# Patient Record
Sex: Male | Born: 1955
Health system: Southern US, Community
[De-identification: ages and names within clinical notes are randomized; demographics above are authoritative.]

## PROBLEM LIST (undated history)

## (undated) DIAGNOSIS — K432 Incisional hernia without obstruction or gangrene: Secondary | ICD-10-CM

## (undated) DIAGNOSIS — E559 Vitamin D deficiency, unspecified: Secondary | ICD-10-CM

## (undated) DIAGNOSIS — R413 Other amnesia: Secondary | ICD-10-CM

## (undated) DIAGNOSIS — M5416 Radiculopathy, lumbar region: Secondary | ICD-10-CM

## (undated) DIAGNOSIS — B1910 Unspecified viral hepatitis B without hepatic coma: Secondary | ICD-10-CM

## (undated) DIAGNOSIS — K7460 Unspecified cirrhosis of liver: Secondary | ICD-10-CM

## (undated) DIAGNOSIS — K219 Gastro-esophageal reflux disease without esophagitis: Secondary | ICD-10-CM

## (undated) DIAGNOSIS — G729 Myopathy, unspecified: Secondary | ICD-10-CM

## (undated) DIAGNOSIS — C9140 Hairy cell leukemia not having achieved remission: Secondary | ICD-10-CM

## (undated) DIAGNOSIS — M5412 Radiculopathy, cervical region: Secondary | ICD-10-CM

## (undated) HISTORY — DX: Hairy cell leukemia not having achieved remission: C91.40

## (undated) HISTORY — PX: SPLENECTOMY, TOTAL: SHX788

## (undated) HISTORY — DX: Radiculopathy, lumbar region: M54.16

## (undated) HISTORY — DX: Unspecified cirrhosis of liver: K74.60

## (undated) HISTORY — DX: Vitamin D deficiency, unspecified: E55.9

## (undated) HISTORY — DX: Myopathy, unspecified: G72.9

## (undated) HISTORY — PX: HERNIA REPAIR: SHX51

## (undated) HISTORY — DX: Other amnesia: R41.3

## (undated) HISTORY — DX: Unspecified viral hepatitis B without hepatic coma: B19.10

## (undated) HISTORY — DX: Incisional hernia without obstruction or gangrene: K43.2

## (undated) HISTORY — DX: Gastro-esophageal reflux disease without esophagitis: K21.9

## (undated) HISTORY — DX: Radiculopathy, cervical region: M54.12

---

## 2003-04-15 ENCOUNTER — Encounter: Admission: RE | Admit: 2003-04-15 | Discharge: 2003-04-15 | Payer: Self-pay | Admitting: Specialist

## 2003-06-08 DIAGNOSIS — C9140 Hairy cell leukemia not having achieved remission: Secondary | ICD-10-CM

## 2003-06-08 HISTORY — DX: Hairy cell leukemia not having achieved remission: C91.40

## 2003-07-29 ENCOUNTER — Inpatient Hospital Stay (HOSPITAL_COMMUNITY): Admission: EM | Admit: 2003-07-29 | Discharge: 2003-08-29 | Payer: Self-pay | Admitting: Emergency Medicine

## 2003-07-30 ENCOUNTER — Encounter (INDEPENDENT_AMBULATORY_CARE_PROVIDER_SITE_OTHER): Payer: Self-pay | Admitting: *Deleted

## 2003-08-03 ENCOUNTER — Encounter (INDEPENDENT_AMBULATORY_CARE_PROVIDER_SITE_OTHER): Payer: Self-pay | Admitting: Specialist

## 2003-09-04 ENCOUNTER — Encounter: Admission: RE | Admit: 2003-09-04 | Discharge: 2003-09-04 | Payer: Self-pay | Admitting: Oncology

## 2003-09-25 ENCOUNTER — Ambulatory Visit (HOSPITAL_COMMUNITY): Admission: RE | Admit: 2003-09-25 | Discharge: 2003-09-25 | Payer: Self-pay | Admitting: Oncology

## 2003-10-22 ENCOUNTER — Encounter: Admission: RE | Admit: 2003-10-22 | Discharge: 2003-10-22 | Payer: Self-pay | Admitting: Internal Medicine

## 2003-11-07 ENCOUNTER — Encounter (INDEPENDENT_AMBULATORY_CARE_PROVIDER_SITE_OTHER): Payer: Self-pay | Admitting: Specialist

## 2003-11-07 ENCOUNTER — Ambulatory Visit (HOSPITAL_COMMUNITY): Admission: RE | Admit: 2003-11-07 | Discharge: 2003-11-07 | Payer: Self-pay | Admitting: Oncology

## 2004-01-17 ENCOUNTER — Ambulatory Visit (HOSPITAL_COMMUNITY): Admission: RE | Admit: 2004-01-17 | Discharge: 2004-01-17 | Payer: Self-pay | Admitting: Oncology

## 2004-04-13 ENCOUNTER — Ambulatory Visit: Payer: Self-pay | Admitting: Oncology

## 2004-06-09 ENCOUNTER — Ambulatory Visit: Payer: Self-pay | Admitting: Oncology

## 2004-06-10 ENCOUNTER — Ambulatory Visit (HOSPITAL_COMMUNITY): Admission: RE | Admit: 2004-06-10 | Discharge: 2004-06-10 | Payer: Self-pay | Admitting: Oncology

## 2004-06-15 ENCOUNTER — Ambulatory Visit (HOSPITAL_COMMUNITY): Admission: RE | Admit: 2004-06-15 | Discharge: 2004-06-15 | Payer: Self-pay | Admitting: Oncology

## 2004-07-22 ENCOUNTER — Ambulatory Visit (HOSPITAL_COMMUNITY): Admission: RE | Admit: 2004-07-22 | Discharge: 2004-07-22 | Payer: Self-pay | Admitting: General Surgery

## 2004-07-29 ENCOUNTER — Ambulatory Visit: Payer: Self-pay | Admitting: Oncology

## 2004-09-17 ENCOUNTER — Ambulatory Visit: Payer: Self-pay | Admitting: Oncology

## 2004-10-15 ENCOUNTER — Ambulatory Visit: Payer: Self-pay | Admitting: Oncology

## 2004-12-17 ENCOUNTER — Ambulatory Visit: Payer: Self-pay | Admitting: Oncology

## 2004-12-23 ENCOUNTER — Other Ambulatory Visit: Admission: RE | Admit: 2004-12-23 | Discharge: 2004-12-23 | Payer: Self-pay | Admitting: Oncology

## 2004-12-23 ENCOUNTER — Ambulatory Visit: Payer: Self-pay | Admitting: Oncology

## 2005-02-10 ENCOUNTER — Ambulatory Visit: Payer: Self-pay | Admitting: Oncology

## 2005-03-06 IMAGING — CT CT PELVIS W/ CM
1 of 4 series · 14 of 32 positions shown, 19 images · IV contrast (omnipaque)
Comparison: none

CLINICAL DATA: Generalized abdominal pain.
 CT ABDOMEN WITH CONTRAST AND CT PELVIS WITH CONTRAST ? 07/29/03
 No prior studies for comparison.
 The patient received 150 cc of Omnipaque 300 IV contrast as well as oral contrast for the study.  
 CT ABDOMEN WITH CONTRAST:
 The visualized lung bases show mild bibasilar atelectasis.  There is significant confluent adenopathy in multiple regions of the abdomen beginning in the retrocrural space in the upper abdomen and continuing to diffusely involve enlarged periportal, celiac, peripancreatic, upper mesenteric, and retroperitoneal lymph nodes.  It is difficult to measure any one single node given the confluent adenopathy present.  The largest single dimension of confluent adenopathy in the periportal region approaches 6 cm in estimated diameter on image #28.  There is associated massive splenomegaly with the spleen continuing down nearly into the left pelvis.  The maximal AP dimensions of the spleen are approximately 18 cm.  Based on the scout image, the height of the spleen is likely much greater than this.  The conglomeration of findings suggests lymphoma.  Leukemia is also in the differential diagnosis.  
 The rest of the study shows a simple cyst of the lateral right kidney.  The gallbladder, pancreas, and adrenal glands have a normal appearance.  The liver shows no focal lesions or evidence of biliary dilatation.  The bowel loops are unremarkable.  
 IMPRESSION
 Massive confluent intra-abdominal adenopathy in multiple locations as above.  This is associated with massive splenomegaly.  Differential considerations include lymphoma and leukemia.
 CT PELVIS WITH CONTRAST:
 No significant retroperitoneal or inguinal adenopathy is present in the pelvis.  There may be a minimal amount of free fluid adjacent to some pelvic bowel loops.  No bowel obstruction.
 No significant adenopathy in the pelvis.

[Series 2: abd/pelvis 5.0 b30f · axial · 0.74mm/px · z∈[-458,-58]mm · 14 of 92 slices shown, 19 images]
[im 6/92  soft-tissue]
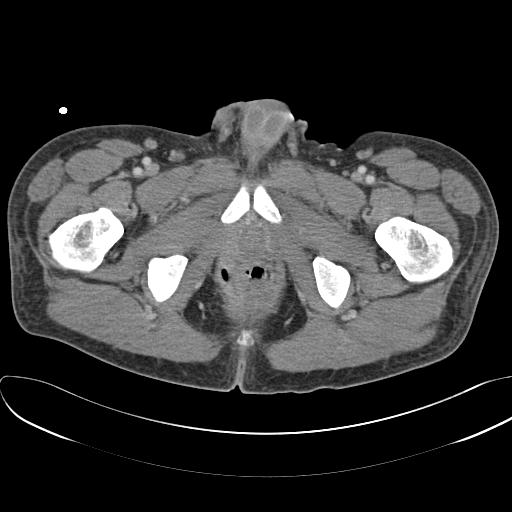
[im 6/92  bone]
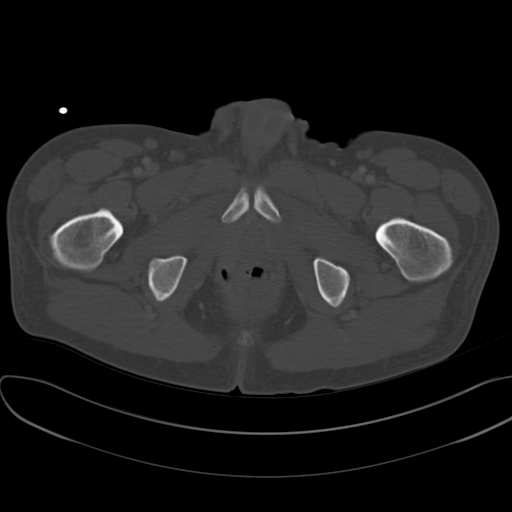
[im 12/92  soft-tissue]
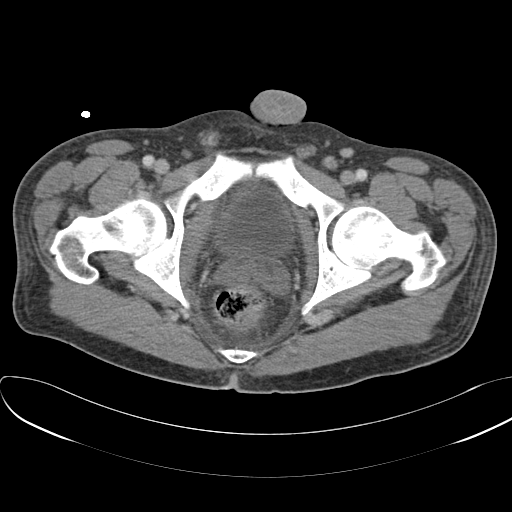
[im 18/92  soft-tissue]
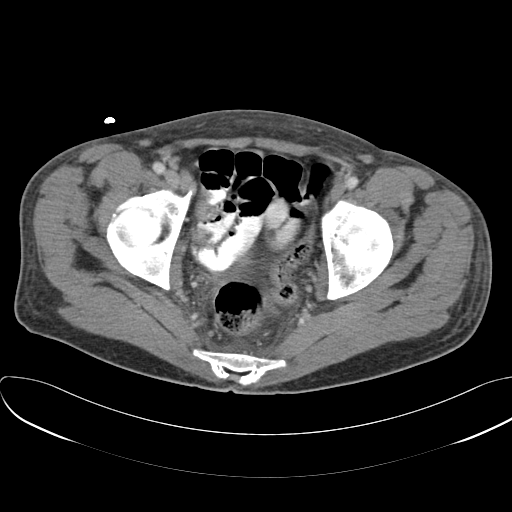
[im 29/92  soft-tissue]
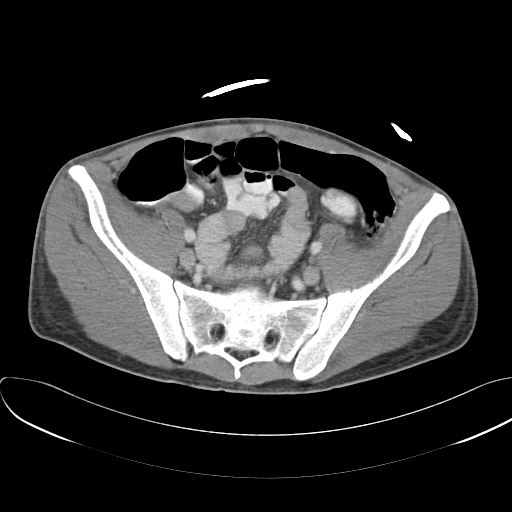
[im 35/92  soft-tissue]
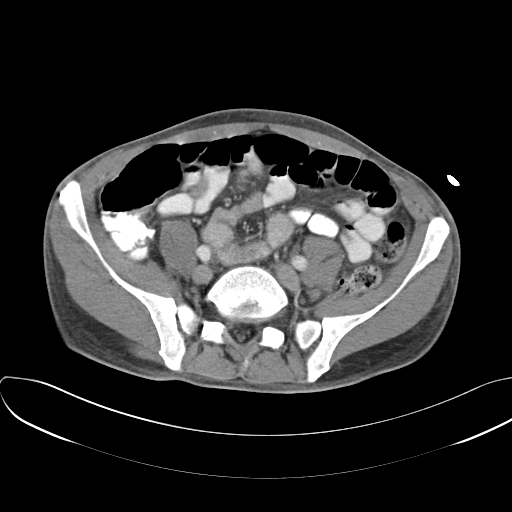
[im 40/92  soft-tissue]
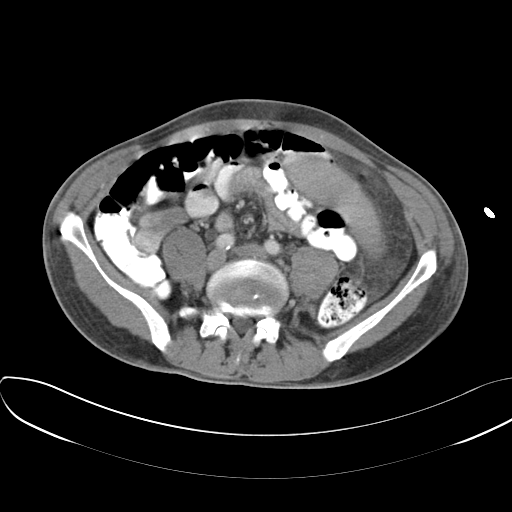
[im 46/92  soft-tissue]
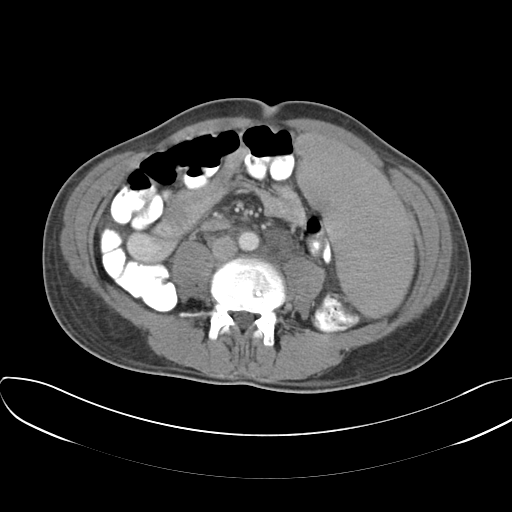
[im 52/92  soft-tissue]
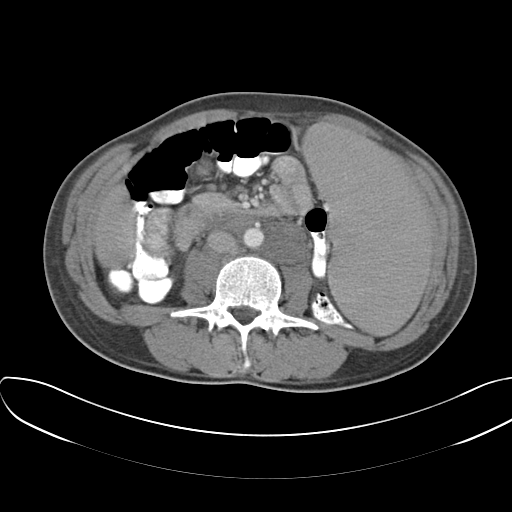
[im 57/92  soft-tissue]
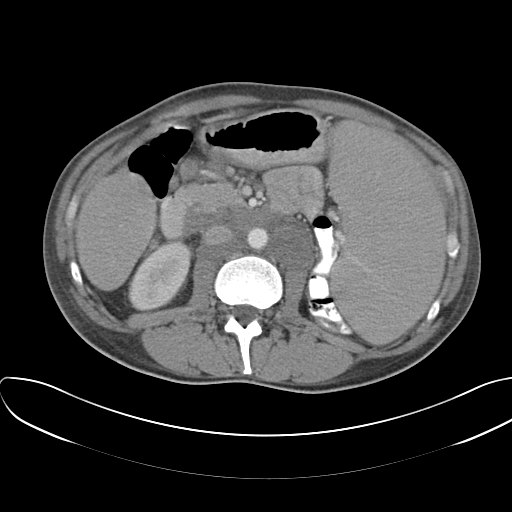
[im 57/92  bone]
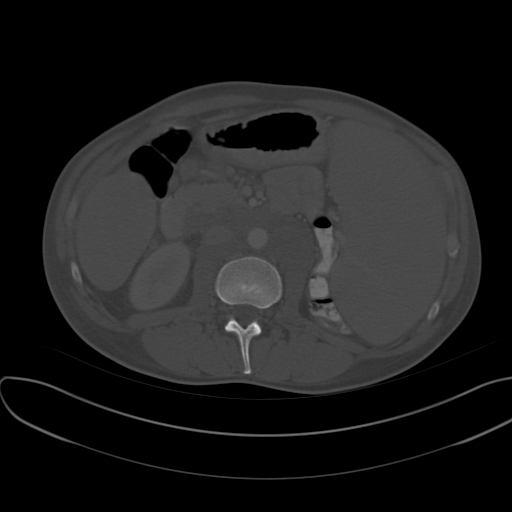
[im 63/92  soft-tissue]
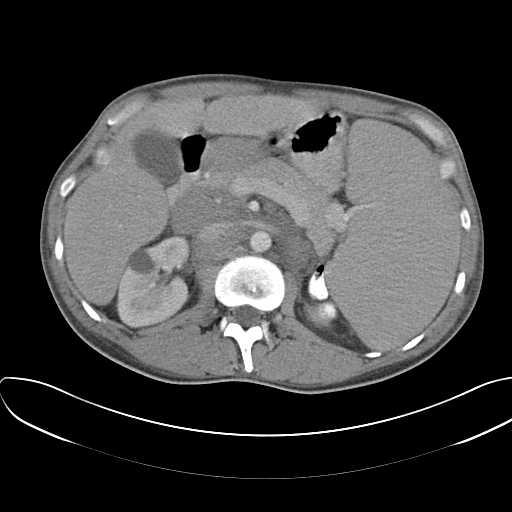
[im 69/92  lung]
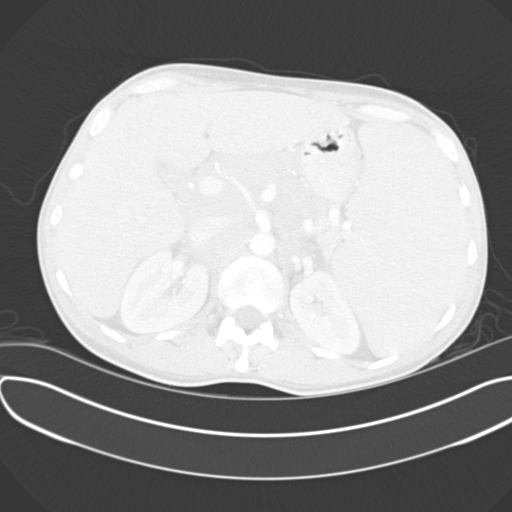
[im 74/92  soft-tissue]
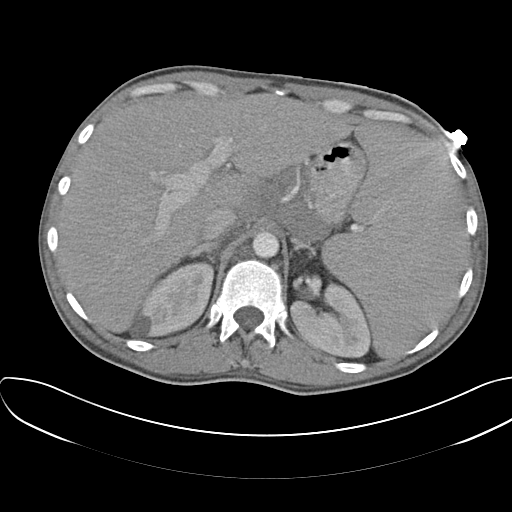
[im 74/92  lung]
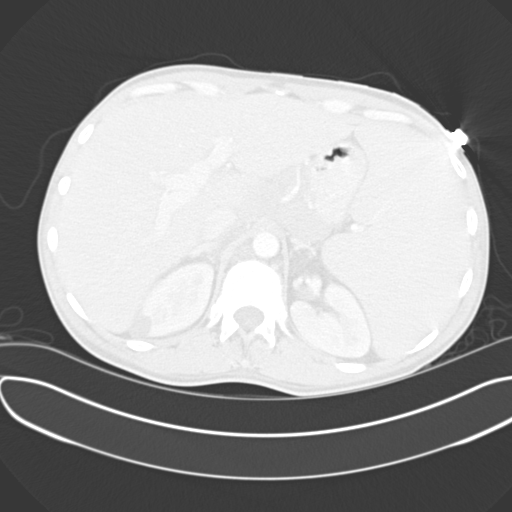
[im 80/92  soft-tissue]
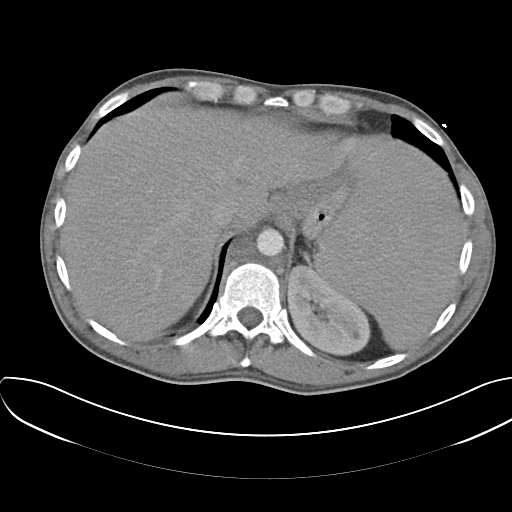
[im 80/92  lung]
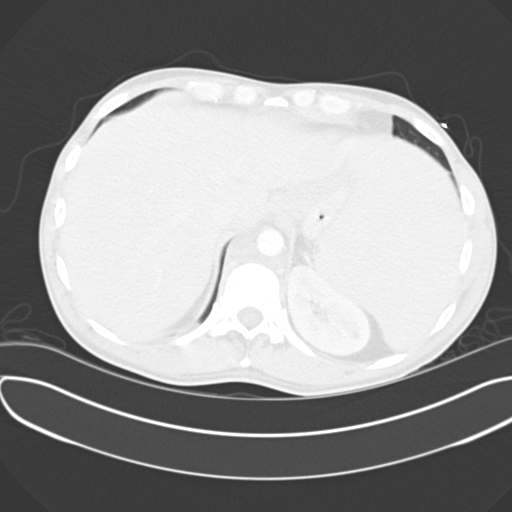
[im 86/92  soft-tissue]
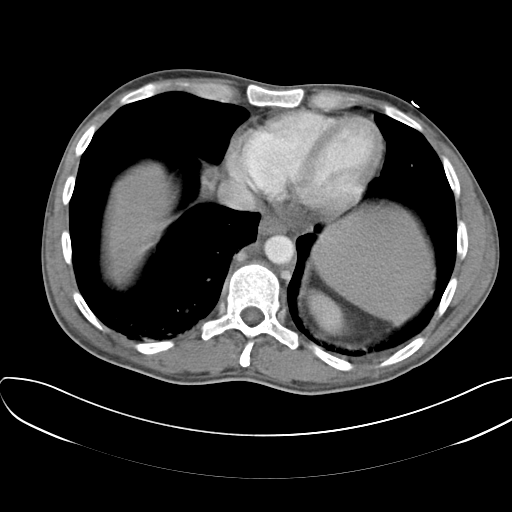
[im 86/92  lung]
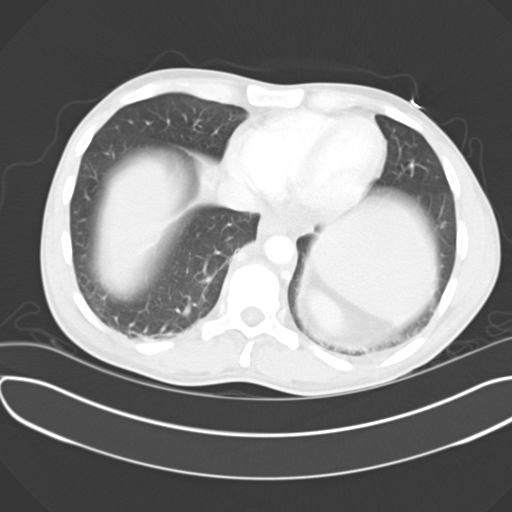

[14 of 32 positions shown; findings below may reference images not displayed]

## 2005-03-10 ENCOUNTER — Ambulatory Visit: Payer: Self-pay | Admitting: Oncology

## 2005-05-12 ENCOUNTER — Ambulatory Visit: Payer: Self-pay | Admitting: Oncology

## 2005-06-14 ENCOUNTER — Inpatient Hospital Stay (HOSPITAL_COMMUNITY): Admission: RE | Admit: 2005-06-14 | Discharge: 2005-06-18 | Payer: Self-pay | Admitting: General Surgery

## 2005-06-14 ENCOUNTER — Encounter (INDEPENDENT_AMBULATORY_CARE_PROVIDER_SITE_OTHER): Payer: Self-pay | Admitting: Specialist

## 2005-07-21 ENCOUNTER — Ambulatory Visit: Payer: Self-pay | Admitting: Oncology

## 2005-09-15 ENCOUNTER — Ambulatory Visit: Payer: Self-pay | Admitting: Oncology

## 2005-09-16 ENCOUNTER — Ambulatory Visit: Payer: Self-pay | Admitting: Oncology

## 2005-09-16 LAB — CBC WITH DIFFERENTIAL/PLATELET
BASO%: 0.3 % (ref 0.0–2.0)
Eosinophils Absolute: 0.2 10*3/uL (ref 0.0–0.5)
HCT: 43.4 % (ref 38.7–49.9)
LYMPH%: 27.1 % (ref 14.0–48.0)
MCHC: 34 g/dL (ref 32.0–35.9)
MONO#: 0.9 10*3/uL (ref 0.1–0.9)
NEUT%: 56.5 % (ref 40.0–75.0)
Platelets: 330 10*3/uL (ref 145–400)
WBC: 7.1 10*3/uL (ref 4.0–10.0)

## 2005-10-01 ENCOUNTER — Ambulatory Visit (HOSPITAL_COMMUNITY): Admission: RE | Admit: 2005-10-01 | Discharge: 2005-10-01 | Payer: Self-pay | Admitting: Oncology

## 2005-10-14 ENCOUNTER — Ambulatory Visit (HOSPITAL_COMMUNITY): Admission: RE | Admit: 2005-10-14 | Discharge: 2005-10-14 | Payer: Self-pay | Admitting: Oncology

## 2005-10-23 ENCOUNTER — Inpatient Hospital Stay (HOSPITAL_COMMUNITY): Admission: EM | Admit: 2005-10-23 | Discharge: 2005-10-24 | Payer: Self-pay | Admitting: Emergency Medicine

## 2005-11-05 ENCOUNTER — Ambulatory Visit: Payer: Self-pay | Admitting: Gastroenterology

## 2005-12-23 ENCOUNTER — Ambulatory Visit: Payer: Self-pay | Admitting: Gastroenterology

## 2006-03-10 ENCOUNTER — Ambulatory Visit: Payer: Self-pay | Admitting: Gastroenterology

## 2006-03-24 ENCOUNTER — Ambulatory Visit: Payer: Self-pay | Admitting: Oncology

## 2006-03-24 ENCOUNTER — Ambulatory Visit: Payer: Self-pay | Admitting: Cardiology

## 2006-03-24 ENCOUNTER — Emergency Department (HOSPITAL_COMMUNITY): Admission: EM | Admit: 2006-03-24 | Discharge: 2006-03-24 | Payer: Self-pay | Admitting: Emergency Medicine

## 2006-05-05 ENCOUNTER — Ambulatory Visit: Payer: Self-pay | Admitting: Gastroenterology

## 2006-09-06 ENCOUNTER — Ambulatory Visit: Payer: Self-pay | Admitting: Gastroenterology

## 2006-09-22 ENCOUNTER — Ambulatory Visit: Payer: Self-pay | Admitting: Oncology

## 2006-09-26 LAB — COMPREHENSIVE METABOLIC PANEL
CO2: 30 mEq/L (ref 19–32)
Creatinine, Ser: 1.05 mg/dL (ref 0.40–1.50)
Glucose, Bld: 75 mg/dL (ref 70–99)
Total Bilirubin: 0.5 mg/dL (ref 0.3–1.2)

## 2006-09-26 LAB — CBC WITH DIFFERENTIAL (CANCER CENTER ONLY)
BASO%: 1.2 % (ref 0.0–2.0)
EOS%: 3 % (ref 0.0–7.0)
LYMPH#: 2.6 10*3/uL (ref 0.9–3.3)
MCHC: 34.2 g/dL (ref 32.0–35.9)
MONO#: 0.7 10*3/uL (ref 0.1–0.9)
NEUT%: 45.6 % (ref 40.0–80.0)
Platelets: 247 10*3/uL (ref 145–400)
RDW: 10.9 % (ref 10.5–14.6)
WBC: 6.6 10*3/uL (ref 4.0–10.0)

## 2006-09-26 LAB — LACTATE DEHYDROGENASE: LDH: 135 U/L (ref 94–250)

## 2006-09-27 LAB — HEPATITIS B SURFACE ANTIGEN: Hepatitis B Surface Ag: POSITIVE — AB

## 2006-09-27 LAB — HEPATITIS B SURFACE ANTIBODY,QUALITATIVE: Hep B S Ab: NEGATIVE

## 2006-09-30 LAB — OTHER SOLSTAS TEST

## 2006-12-13 ENCOUNTER — Ambulatory Visit: Payer: Self-pay | Admitting: Gastroenterology

## 2006-12-21 ENCOUNTER — Ambulatory Visit (HOSPITAL_COMMUNITY): Admission: RE | Admit: 2006-12-21 | Discharge: 2006-12-21 | Payer: Self-pay | Admitting: Gastroenterology

## 2007-03-09 ENCOUNTER — Ambulatory Visit: Payer: Self-pay | Admitting: Gastroenterology

## 2007-03-15 ENCOUNTER — Ambulatory Visit: Payer: Self-pay | Admitting: Gastroenterology

## 2007-03-20 ENCOUNTER — Ambulatory Visit: Payer: Self-pay | Admitting: Gastroenterology

## 2007-03-20 DIAGNOSIS — K2970 Gastritis, unspecified, without bleeding: Secondary | ICD-10-CM | POA: Insufficient documentation

## 2007-03-20 DIAGNOSIS — K2990 Gastroduodenitis, unspecified, without bleeding: Secondary | ICD-10-CM

## 2007-03-24 ENCOUNTER — Ambulatory Visit: Payer: Self-pay | Admitting: Oncology

## 2007-03-27 LAB — CBC WITH DIFFERENTIAL (CANCER CENTER ONLY)
BASO#: 0.1 10*3/uL (ref 0.0–0.2)
BASO%: 1.3 % (ref 0.0–2.0)
EOS%: 3.5 % (ref 0.0–7.0)
HCT: 48 % (ref 38.7–49.9)
HGB: 16.3 g/dL (ref 13.0–17.1)
LYMPH#: 2.8 10*3/uL (ref 0.9–3.3)
MCHC: 34 g/dL (ref 32.0–35.9)
MONO#: 0.6 10*3/uL (ref 0.1–0.9)
NEUT#: 3.2 10*3/uL (ref 1.5–6.5)
NEUT%: 45.9 % (ref 40.0–80.0)
RDW: 11.1 % (ref 10.5–14.6)
WBC: 6.9 10*3/uL (ref 4.0–10.0)

## 2007-03-27 LAB — COMPREHENSIVE METABOLIC PANEL
AST: 30 U/L (ref 0–37)
Alkaline Phosphatase: 108 U/L (ref 39–117)
BUN: 16 mg/dL (ref 6–23)
Creatinine, Ser: 0.94 mg/dL (ref 0.40–1.50)

## 2007-06-15 ENCOUNTER — Ambulatory Visit: Payer: Self-pay | Admitting: Gastroenterology

## 2007-06-19 ENCOUNTER — Ambulatory Visit (HOSPITAL_COMMUNITY): Admission: RE | Admit: 2007-06-19 | Discharge: 2007-06-19 | Payer: Self-pay | Admitting: Gastroenterology

## 2007-08-17 DIAGNOSIS — J13 Pneumonia due to Streptococcus pneumoniae: Secondary | ICD-10-CM | POA: Insufficient documentation

## 2007-08-17 DIAGNOSIS — Z862 Personal history of diseases of the blood and blood-forming organs and certain disorders involving the immune mechanism: Secondary | ICD-10-CM | POA: Insufficient documentation

## 2007-08-17 DIAGNOSIS — C9140 Hairy cell leukemia not having achieved remission: Secondary | ICD-10-CM | POA: Insufficient documentation

## 2007-08-17 DIAGNOSIS — R161 Splenomegaly, not elsewhere classified: Secondary | ICD-10-CM | POA: Insufficient documentation

## 2007-08-17 DIAGNOSIS — K612 Anorectal abscess: Secondary | ICD-10-CM | POA: Insufficient documentation

## 2007-08-17 DIAGNOSIS — R945 Abnormal results of liver function studies: Secondary | ICD-10-CM | POA: Insufficient documentation

## 2007-08-17 DIAGNOSIS — B181 Chronic viral hepatitis B without delta-agent: Secondary | ICD-10-CM | POA: Insufficient documentation

## 2007-09-14 ENCOUNTER — Ambulatory Visit: Payer: Self-pay | Admitting: Gastroenterology

## 2007-09-21 ENCOUNTER — Ambulatory Visit: Payer: Self-pay | Admitting: Oncology

## 2007-09-25 LAB — COMPREHENSIVE METABOLIC PANEL
AST: 22 U/L (ref 0–37)
Alkaline Phosphatase: 84 U/L (ref 39–117)
Glucose, Bld: 94 mg/dL (ref 70–99)
Sodium: 140 mEq/L (ref 135–145)
Total Bilirubin: 0.2 mg/dL — ABNORMAL LOW (ref 0.3–1.2)
Total Protein: 7.6 g/dL (ref 6.0–8.3)

## 2007-09-25 LAB — CBC WITH DIFFERENTIAL (CANCER CENTER ONLY)
BASO%: 1.9 % (ref 0.0–2.0)
EOS%: 2.4 % (ref 0.0–7.0)
HCT: 44.9 % (ref 38.7–49.9)
LYMPH%: 28 % (ref 14.0–48.0)
MCH: 32.1 pg (ref 28.0–33.4)
MCHC: 34.4 g/dL (ref 32.0–35.9)
MCV: 93 fL (ref 82–98)
MONO%: 8.3 % (ref 0.0–13.0)
NEUT%: 59.4 % (ref 40.0–80.0)
Platelets: 386 10*3/uL (ref 145–400)
RDW: 10.9 % (ref 10.5–14.6)
WBC: 8.5 10*3/uL (ref 4.0–10.0)

## 2007-12-21 ENCOUNTER — Ambulatory Visit: Payer: Self-pay | Admitting: Gastroenterology

## 2008-03-21 ENCOUNTER — Ambulatory Visit: Payer: Self-pay | Admitting: Gastroenterology

## 2008-04-08 ENCOUNTER — Ambulatory Visit: Payer: Self-pay | Admitting: Oncology

## 2008-04-09 LAB — CBC WITH DIFFERENTIAL (CANCER CENTER ONLY)
BASO#: 0.1 10*3/uL (ref 0.0–0.2)
Eosinophils Absolute: 0.2 10*3/uL (ref 0.0–0.5)
HCT: 45.2 % (ref 38.7–49.9)
LYMPH%: 29.7 % (ref 14.0–48.0)
MCH: 33 pg (ref 28.0–33.4)
MCV: 96 fL (ref 82–98)
MONO%: 7.2 % (ref 0.0–13.0)
NEUT%: 59 % (ref 40.0–80.0)
Platelets: 270 10*3/uL (ref 145–400)
RBC: 4.7 10*6/uL (ref 4.20–5.70)

## 2008-04-09 LAB — CMP (CANCER CENTER ONLY)
Albumin: 3.9 g/dL (ref 3.3–5.5)
Alkaline Phosphatase: 77 U/L (ref 26–84)
CO2: 27 mEq/L (ref 18–33)
Calcium: 9.1 mg/dL (ref 8.0–10.3)
Chloride: 99 mEq/L (ref 98–108)
Glucose, Bld: 101 mg/dL (ref 73–118)
Potassium: 3.9 mEq/L (ref 3.3–4.7)
Sodium: 136 mEq/L (ref 128–145)
Total Protein: 7.3 g/dL (ref 6.4–8.1)

## 2008-04-09 LAB — LACTATE DEHYDROGENASE: LDH: 127 U/L (ref 94–250)

## 2008-06-20 ENCOUNTER — Ambulatory Visit: Payer: Self-pay | Admitting: Gastroenterology

## 2008-06-25 ENCOUNTER — Ambulatory Visit (HOSPITAL_COMMUNITY): Admission: RE | Admit: 2008-06-25 | Discharge: 2008-06-25 | Payer: Self-pay | Admitting: Gastroenterology

## 2008-11-11 ENCOUNTER — Ambulatory Visit (HOSPITAL_COMMUNITY): Admission: RE | Admit: 2008-11-11 | Discharge: 2008-11-12 | Payer: Self-pay | Admitting: General Surgery

## 2008-11-19 ENCOUNTER — Ambulatory Visit: Payer: Self-pay | Admitting: Gastroenterology

## 2009-04-04 ENCOUNTER — Ambulatory Visit: Payer: Self-pay | Admitting: Oncology

## 2009-04-09 LAB — CBC WITH DIFFERENTIAL (CANCER CENTER ONLY)
BASO#: 0.1 10*3/uL (ref 0.0–0.2)
Eosinophils Absolute: 0.2 10*3/uL (ref 0.0–0.5)
HGB: 16.1 g/dL (ref 13.0–17.1)
MCH: 33.1 pg (ref 28.0–33.4)
MCV: 99 fL — ABNORMAL HIGH (ref 82–98)
MONO#: 0.3 10*3/uL (ref 0.1–0.9)
MONO%: 4.6 % (ref 0.0–13.0)
NEUT#: 2.7 10*3/uL (ref 1.5–6.5)
RBC: 4.85 10*6/uL (ref 4.20–5.70)

## 2009-05-15 ENCOUNTER — Ambulatory Visit: Payer: Self-pay | Admitting: Gastroenterology

## 2009-06-10 ENCOUNTER — Ambulatory Visit (HOSPITAL_COMMUNITY): Admission: RE | Admit: 2009-06-10 | Discharge: 2009-06-10 | Payer: Self-pay | Admitting: Gastroenterology

## 2010-03-19 ENCOUNTER — Ambulatory Visit: Payer: Self-pay | Admitting: Gastroenterology

## 2010-03-20 ENCOUNTER — Encounter: Admission: RE | Admit: 2010-03-20 | Discharge: 2010-03-20 | Payer: Self-pay | Admitting: Family Medicine

## 2010-06-28 ENCOUNTER — Encounter: Payer: Self-pay | Admitting: Oncology

## 2010-06-28 ENCOUNTER — Encounter: Payer: Self-pay | Admitting: Gastroenterology

## 2010-07-21 ENCOUNTER — Other Ambulatory Visit: Payer: Self-pay | Admitting: Gastroenterology

## 2010-07-21 DIAGNOSIS — B1910 Unspecified viral hepatitis B without hepatic coma: Secondary | ICD-10-CM

## 2010-07-24 ENCOUNTER — Inpatient Hospital Stay (HOSPITAL_COMMUNITY): Admission: RE | Admit: 2010-07-24 | Payer: Self-pay | Source: Ambulatory Visit

## 2010-07-28 ENCOUNTER — Other Ambulatory Visit: Payer: Self-pay | Admitting: Gastroenterology

## 2010-07-28 DIAGNOSIS — B181 Chronic viral hepatitis B without delta-agent: Secondary | ICD-10-CM

## 2010-08-05 ENCOUNTER — Other Ambulatory Visit (HOSPITAL_COMMUNITY): Payer: Self-pay

## 2010-08-10 ENCOUNTER — Other Ambulatory Visit: Payer: Self-pay | Admitting: Gastroenterology

## 2010-08-10 DIAGNOSIS — B181 Chronic viral hepatitis B without delta-agent: Secondary | ICD-10-CM

## 2010-08-14 ENCOUNTER — Other Ambulatory Visit (HOSPITAL_COMMUNITY): Payer: Self-pay

## 2010-08-14 ENCOUNTER — Ambulatory Visit (HOSPITAL_COMMUNITY)
Admission: RE | Admit: 2010-08-14 | Discharge: 2010-08-14 | Disposition: A | Discharge status: Home / Self Care | Payer: Medicaid Other | Source: Ambulatory Visit | Attending: Gastroenterology | Admitting: Gastroenterology

## 2010-08-14 DIAGNOSIS — Z9089 Acquired absence of other organs: Secondary | ICD-10-CM | POA: Insufficient documentation

## 2010-08-14 DIAGNOSIS — N281 Cyst of kidney, acquired: Secondary | ICD-10-CM | POA: Insufficient documentation

## 2010-08-14 DIAGNOSIS — K7460 Unspecified cirrhosis of liver: Secondary | ICD-10-CM | POA: Insufficient documentation

## 2010-08-14 DIAGNOSIS — B181 Chronic viral hepatitis B without delta-agent: Secondary | ICD-10-CM

## 2010-08-27 ENCOUNTER — Other Ambulatory Visit: Payer: Self-pay | Admitting: Podiatry

## 2010-09-14 LAB — CBC
Hemoglobin: 15.1 g/dL (ref 13.0–17.0)
RBC: 4.57 MIL/uL (ref 4.22–5.81)
WBC: 5.8 10*3/uL (ref 4.0–10.5)

## 2010-09-14 LAB — COMPREHENSIVE METABOLIC PANEL
AST: 31 U/L (ref 0–37)
BUN: 10 mg/dL (ref 6–23)
Chloride: 106 mEq/L (ref 96–112)
Creatinine, Ser: 0.79 mg/dL (ref 0.4–1.5)
GFR calc non Af Amer: >60 mL/min (ref >60)
Potassium: 3.9 mEq/L (ref 3.5–5.1)

## 2010-09-14 LAB — DIFFERENTIAL
Basophils Relative: 1 % (ref 0–1)
Lymphs Abs: 2.1 10*3/uL (ref 0.7–4.0)
Monocytes Relative: 10 % (ref 3–12)
Neutro Abs: 2.9 10*3/uL (ref 1.7–7.7)
Neutrophils Relative %: 50 % (ref 43–77)

## 2010-10-20 NOTE — Op Note (Signed)
NAMEIORI, [name]            ACCOUNT NO.:  [id number]   MEDICAL RECORD NO.:  [id number]          PATIENT TYPE:  OIB   LOCATION:  1524                         FACILITY:  [facility]   PHYSICIAN:  Adolph Pollack, M.D.DATE OF BIRTH:  [birth year]   DATE OF PROCEDURE:  DATE OF DISCHARGE:                               OPERATIVE REPORT   PREOPERATIVE DIAGNOSIS:  Ventral incisional hernia.   POSTOPERATIVE DIAGNOSIS:  Ventral incisional hernia.   PROCEDURE:  1. Laparoscopic lysis of adhesions (45 minutes).  2. Laparoscopic ventral incisional hernia with mesh.   SURGEON:  Adolph Pollack, M.D.   ANESTHESIA:  General.   INDICATION:  Mr. Bramblett underwent open splenectomy June 14, 2005.  Beginning of this year he noticed an enlarging bulge in the epigastric  area around the incision and had an incisional hernia.  He now presents  for repair.  We discussed the procedure risks and aftercare  preoperatively.   TECHNIQUE:  He is seen in the holding area and brought to the operating  room, placed supine on the operating table.  General anesthetic was  administered.  A Foley catheter was inserted.  The hair on the abdominal  wall was clipped.  The abdominal wall was sterilely prepped and draped.   In the right mid lateral abdomen, an incision was made through skin and  subcutaneous tissue.  I then incised the fascial layers and peritoneum  entering the peritoneal cavity under direct vision.  A pursestring  suture of 0 Vicryl was placed around the fascial layers.  A Hasson  trocar was introduced to the peritoneal cavity and pneumoperitoneum  created by insufflation of CO2 gas.   Next the laparoscope was introduced and multiple adhesions of the  omentum to the abdominal wall were noted in the upper midline.  I was  able to view the left upper quadrant and placed an 11 mm trocar in the  left upper quadrant laterally and a 5 mm trocar and in the left lower  quadrant laterally.  Using  the Harmonic scalpel, I spent the next 45  minutes lysing adhesions between the omentum and the anterior abdominal  wall.  I added a 5-mm trocar to the right lower quadrant and there were  adhesions between the liver in the anterior abdominal wall with harmonic  scalpel.  These adhesions were lysed and the liver was mobilized,  dropping it away from the anterior abdominal wall.  I then visualized  the defect of the hernia.   Using a spinal needle, I marked the periphery of the hernia and measured  4 cm out from this.  I then drew a large oval.  I brought a piece of  Parietex mesh into the field with a nonadherent barrier on it.  It  measured 20 x 25 cm.  I placed 8 sutures of #1 Novofil for anchoring  sutures around the periphery of the mesh.  The mesh was then hydrated,  rolled up and placed into abdominal cavity.   The mesh was then unrolled with the nonadherent side facing the viscera.  I made stab incisions at 8 places around the  periphery of the mesh and  brought up the anchoring sutures across the fascial bridge.  These were  then tied down anchoring the mesh to the anterior abdominal wall.  I  then used spiral tacks to anchor the mesh in an outer rim of tacks and  inner rim of tacks for further anchoring.  This more than covered the  hernia defect with good overlap.   I then inspected the quadrants of the abdomen and the liver and there  was no bleeding noted.  No evidence of intestinal injury was noted.  Following this, I removed the Hasson trocar and closed that fascial  defect under laparoscopic vision by tightening up and tying down the  pursestring suture.  I then removed the remaining trocars.  The CO2 gas  was released.   All skin incisions were closed with 4-0 Monocryl subcuticular stitches  followed by Steri-Strips and sterile dressings.  An abdominal binder was  applied.   He tolerated procedure without apparent complications and was taken to  recovery in  satisfactory condition.      Adolph Pollack, M.D.  Electronically Signed     TJR/MEDQ  D:  11/11/2008  T:  11/11/2008  Job:  [id number]   cc:   Drue Second, MD  Fax: [phone]

## 2010-10-20 NOTE — Assessment & Plan Note (Signed)
[facility]                         GASTROENTEROLOGY OFFICE NOTE   JERMAIN, CURT                     MRN:          [id number]  DATE:03/15/2007                            DOB:          1956-04-20    REFERRING PHYSICIAN:  [facility], Dr. Scot Jun on [facility].   REASON FOR REFERRAL:  To consider upper endoscopy to screen for  esophageal varices.   HISTORY OF PRESENT ILLNESS:  Mr. 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  He is seen in the specialty clinic on [facility]  for that, and he was referred here to discuss EGD screening for  esophageal varices.  He has never had overt GI bleeding.  No  hematemesis, no melena, no encephalopathic episodes.  He has had a  recent MRI in his abdomen performed two months ago that showed some mild  perihepatic ascites, some slight porta hepatis adenopathy.  All this is  stable.  He had some diffuse hepatocellular micronodular pattern.  Did  not see that this MRI state shows whether he clearly has cirrhosis or  not.  Recent lab testing is not available to me at the time of this  visit.   REVIEW OF SYSTEMS:  Notable for stable weight, otherwise, essentially  normal.  __________ nursing intake sheet.   PAST MEDICAL HISTORY:  1. Chronic hepatitis B as above, currently being treated with Tyzeka      under the direction of Dr. Scot Jun, [facility] on      [facility].  2. 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 Drue Second, status post splenectomy for the      same.   CURRENT MEDICATIONS:  Tyzeka 600 mg once daily.   ALLERGIES:  IVP DYE, IODINE CONTRAST.   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  Smokes half pack of cigarettes a day.  Nondrinker.   FAMILY HISTORY:  No colon cancer,  colon polyps.  No liver disease in  family.   PHYSICAL EXAMINATION:  VITAL SIGNS:  Weight 169 pounds, blood pressure  108/64, pulse 60.  CONSTITUTION:  General well-appearing.  NEUROLOGICAL:  Alert and oriented x3.  HEENT:  Eyes:  Anicteric sclerae.  Extraocular movements intact.  Mouth:  Oropharynx is moist.  No lesions.  NECK:  Supple.  No lymphadenopathy.  CARDIOVASCULAR:  Heart regular rate and rhythm.  LUNGS:  Clear to auscultation bilaterally.  ABDOMEN:  Soft, nontender, nondistended, normal bowel sounds.  EXTREMITIES:  No lower extremity edema.  SKIN:  No rash or lesions other than his lower extremities.   ASSESSMENT/PLAN:  A 55 year old man with chronic hepatitis B.   Recent MRI performed does not show convincing evidence that he has  underlying cirrhosis.  We will proceed with variceal screening in the  next several days, and I will forward these results to Dr. Scot Jun at the  [facility].     Rachael Fee,  MD  Electronically Signed    DPJ/MedQ  DD: 03/15/2007  DT: 03/16/2007  Job #: [id number]   cc:   [facility], [place]. Dr. Scot Jun

## 2010-10-23 NOTE — Discharge Summary (Signed)
[name], [name]            ACCOUNT NO.:  [id number]   MEDICAL RECORD NO.:  [id number]          PATIENT TYPE:  INP   LOCATION:  1606                         [facility]AC[facility]:  [facility]   PHYSICIAN:  Adolph Pollack, M.D.DATE OF BIRTH:  Dec 27, 1955   DATE OF ADMISSION:  06/14/2005  DATE OF DISCHARGE:  06/18/2005                                 DISCHARGE SUMMARY   PRINCIPLE DISCHARGE DIAGNOSIS:  Leukemia and splenomegaly.   PROCEDURE:  Splenectomy.   INDICATIONS:  [name] is a 55 year old male with leukemia who began  having problems with intermittent pancytopenia.  He had significant  splenomegaly and was admitted for elective splenectomy.   HOSPITAL COURSE:  He underwent the above procedure and tolerated it well.  His thrombocytopenia improved.  He had an uncomplicated postoperative  course.  By his fourth postoperative day, he was having bowel movements,  tolerating a diet.  The wound looked good, and he is able to be discharged.   DISPOSITION:  Discharged home in satisfactory condition on June 18, 2005.  He was given discharge instructions, Tylox for pain, and will follow up in  the office with me on Wednesday, January 17 for staple removal and wound  check.      Adolph Pollack, M.D.  Electronically Signed     TJR/MEDQ  D:  07/28/2005  T:  07/28/2005  Job:  [id number]

## 2010-10-23 NOTE — H&P (Signed)
NAMETYRA, MICHELLE            ACCOUNT NO.:  [id number]   MEDICAL RECORD NO.:  [id number]          PATIENT TYPE:  INP   LOCATION:  0101                         FACILITY:  [facility]   PHYSICIAN:  Della Goo, M.D. DATE OF BIRTH:  August 26, 1955   DATE OF ADMISSION:  10/23/2005  DATE OF DISCHARGE:                                HISTORY & PHYSICAL   CHIEF COMPLAINT:  Cough and fever.   HISTORY OF PRESENT ILLNESS:  This is a 55 year old male with a history of  hairy-cell leukemia, in remission for 2 years, presenting to the emergency  department at [facility] secondary to sudden onset of  fevers, chills, cough, and yellow sputum production for 24 hours. The  patient reports having increased myalgias of arms and legs but denies have  nausea, vomiting, and diarrhea. He denies having chest pain or shortness of  breath. Denies having diaphoresis.   PAST MEDICAL HISTORY:  As mentioned above.   PAST SURGICAL HISTORY:  The patient has a history of a splenectomy.   MEDICATIONS:  None.   ALLERGIES:  IV DYE, which causes anaphylaxis.   SOCIAL HISTORY:  The patient works in [employer]. He has a positive  tobacco history, 1 pack per day for approximately 25 years. No history of  alcohol or drug abuse.   FAMILY HISTORY:  Noncontributory.   REVIEW OF SYSTEMS:  Above.   PHYSICAL EXAMINATION:  GENERAL:  A thin, pleasant, 55 year old well  developed male in no acute distress.  VITAL SIGNS:  Temperature 102.1, blood pressure 143/98 to 107/67. Heart rate  98 to 100. Respiratory rate 12 to 16. O2 saturation 97% to 100%.  HEENT:  Normocephalic and atraumatic. There is no scleral icterus. Pupils  are equal, round, and reactive to light. Extraocular muscles intact.  Funduscopic examination benign. Tympanic membranes clear bilaterally.  Oropharynx, edentulous, upper and lower dentures present. No oral exudates.  NECK:  Supple with range of motion. There is no jugular venous  distention.  No thyromegaly.  CARDIOVASCULAR:  Mild tachycardia. Regular rate and rhythm. No murmur, rub,  or gallop.  LUNGS:  Clear to auscultation bilaterally. No rales, rhonchi, or wheezes.  ABDOMEN:  Positive bowel sounds. Soft. There is right upper quadrant  tenderness. There is no rebound or guarding. Positive hepatomegaly, mild .  There is a visible old midline scar.  RECTAL:  Examination deferred.  BACK:  No spinous process tenderness. No CVA tenderness.  GENITOURINARY:  Deferred.  NEUROLOGIC:  Alert and oriented times three. Nonfocal.  MUSCULOSKELETAL:  5 over 5 strength throughout. Full range of motion. No  atrophy.   LABORATORY DATA:  White blood cell count 14.6. Neutrophils 72%. Lymphocytes  15%. Monocytes 11%. Hemoglobin and hematocrit 14.0 and 41.5. Platelets  306,000. Sodium 128, potassium 3.6, chloride 99, bicarb 25, BUN 12,  creatinine 0.9, glucose 92. Calcium level 9.0. Total protein 7.1. Total  bilirubin 1.2. Urinalysis negative.   Per patient's report, he has had a recent diagnosis of a positive hepatitis  B surface antigen. He reports in the past having the hepatitis vaccine. The  patient was to be referred to  gastroenterologist/hepatologist for further  evaluation of his hepatitis B. The appointment was in the month of July.   Chest x-ray reveals a left lower lobe infiltrate.   ASSESSMENT:  This is a 55 year old male with cough, which is productive,  fevers, chills, admitted with probable CAP pneumonia, due to his past  medical history of hairy-cell leukemia and recent positive hepatitis B  surface antigen.   ADMISSION DIAGNOSES:  1.  Left lower lobe pneumonia, probably community acquired pneumonia.  2.  Hyponatremia.  3.  Splenectomy and history of hairy-cell leukemia.  4.  Positive hepatitis B.   PLAN:  The patient has been admitted to [facility] for IV antibiotics  with Rocephin and p.o. Azithromycin. A sputum for C&S will be ordered and  gram  stain. The patient will be placed on p.r.n. nasal cannula oxygen,  p.r.n. nebulizer treatments with pulmonary toilet. Tylenol p.r.n. for  fevers. IV fluids have been ordered for rehydration and sodium and potassium  supplementation. A hepatologist consultation will be considered.      Della Goo, M.D.  Electronically Signed     HJ/MEDQ  D:  10/23/2005  T:  10/23/2005  Job:  [id number]

## 2010-10-23 NOTE — Op Note (Signed)
[name], [name]                        ACCOUNT NO.:  [id number]   MEDICAL RECORD NO.:  [id number]                   PATIENT TYPE:  INP   LOCATION:  7846                                 FACILITY:  [facility]   PHYSICIAN:  Currie Paris, M.D.           DATE OF BIRTH:  1956/03/31   DATE OF PROCEDURE:  08/03/2003  DATE OF DISCHARGE:                                 OPERATIVE REPORT   PREOPERATIVE DIAGNOSES:  1. Right-sided pararectal abscess.  2. Hairy cell leukemia.   POSTOPERATIVE DIAGNOSES:  1. Right-sided pararectal abscess.  2. Hairy cell leukemia.   OPERATION:  1. Anoscopy.  2. I&D of perirectal abscess with biopsy of wall of abscess.  3. Placement of seton.   SURGEON:  Currie Paris, M.D.   ANESTHESIA:  General endotracheal.   CLINICAL HISTORY:  This patient is a 55 year old recently diagnosed with  hairy cell leukemia.  He also has a right-sided perirectal abscess.  CT scan  showed some air in the tissues adjacent to the rectum, and he was pointing  in an area to the right, somewhat posterior.  It was felt that he needed to  have this drained prior to starting his chemo.   DESCRIPTION OF PROCEDURE:  The patient was seen in the holding area and had  no further questions.  He was taken to the operating room and after  satisfactory general endotracheal anesthesia had been obtained, was placed  in lithotomy position.  The perirectal area was prepped with some Betadine.  Using the cautery, I excised an ellipse of skin overlying the abscess cavity  and got purulent material out which was cultured.  The wall was sent for  biopsy.   I probed up, and the tract ran adjacent to the rectum about 2-3 cm, and we  irrigated that out and swapped it out with a dry gauze to try to get any  granulation tissue out.  At this point, I placed an anoscope and noted that  we could identify an internal opening about 3-4 cm up and somewhat laterally  on the right side.  With  gentle manipulation, I was able to get a probe  through the I&D site into the internal opening and thread two #0 silk  sutures through, tie them down gently to keep the tract open as a seton.   I then checked to make sure everything was as dry as could be.  There was no  significant bleeding, but the patient had received platelets just prior to  starting.  I did pack with a 4 x 4 and soaked it in some Marcaine to see if  that would help as a postoperative analgesia.   The patient tolerated the procedure well.  There were no operative  complications.  All counts were correct.  Currie Paris, M.D.    CJS/MEDQ  D:  08/03/2003  T:  08/03/2003  Job:  [id number]

## 2010-10-23 NOTE — Consult Note (Signed)
[name], [name]                        ACCOUNT NO.:  [id number]   MEDICAL RECORD NO.:  [id number]                   PATIENT TYPE:  INP   LOCATION:  0101                                 FACILITY:  [facility]   PHYSICIAN:  Drue Second, MD                    DATE OF BIRTH:  1955-06-30   DATE OF CONSULTATION:  07/29/2003  DATE OF DISCHARGE:                                   CONSULTATION   REFERRING PHYSICIAN:  Hollice Espy, M.D.   REASON FOR REFERRAL:  A 55 year old male who was seen in the emergency room  with weakness, fatigue, and thrombocytopenia and anemia.   HISTORY OF PRESENT ILLNESS:  The patient is a 55 year old male from [place]  who has been living in [place] for the past 20 years.  He states that for  the past three months or so, he has had increasing fatigue and weakness.  Initially he felt it was secondary to having lost a job and too much stress.  However, he continued to have easy fatigability with diminished energy  reserve.  The patient has also noted, over the past few months, lower  extremity edema for which he was seen in Urgent Care and was given  prescription for a water pill; I presume it is Lasix, and that has been  taken care of.  His appetite has been poor.  He has had about 20 to 30-pound  weight loss over a 50-month period.  He also complains of having fevers,  night sweats over the past 10 days.   He was seen in the emergency room today as he complained of having  increasing fatigue and had what sounds like a near syncopal episode with  some mild chest pressure.  Of further note, he states that his wife and his  brother-in-law have noticed that he looks pale, and even his eyes look  yellow.  In the emergency room, the patient is jaundiced.  He is very tired  appearing and pale.  He has no complaints of chest pain.   He had a CBC performed which was significant for a white count of 4.4,  hemoglobin 5.4, hematocrit 15.4, platelets 22,000.  The  differential  included 4 neutrophils, 91 lymphocytes, 4 monocytes, 1 eosinophil.  His  absolute neutrophil count was 200.  The patient's peripheral smear was  reviewed, and there was noted to be atypical lymphocytes on the peripheral  smear.  Electrolytes of note were sodium 122, AST 55, total bilirubin 2.3.  PT 16.4, PTT 50.  The patient also states that he has had nose bleeds off  and on.   PAST MEDICAL HISTORY:  The patient denies having any chronic medical  problems, although lately he did complain of having lower extremity swelling  for which he went to Urgent Care and received Lasix for the past one week.   ALLERGIES:  No known drug allergies.  CURRENT MEDICATIONS:  Lasix p.r.n.   FAMILY HISTORY:  The patient's parents live in [place].  His father has a  history of heart disease and recently had a heart attack.  His mother is 80  and well.  He has nine brothers who are all alive and healthy, and he has  five sisters who are all alive and healthy.   FAMILY HISTORY:  The patient is married; he has three children, ages 54, 83,  and 1-1/2 years.  He has lived in the [place] for the last 20 years,  has not traveled outside of the [place] since he has been here.  He  currently works part time in [employer].  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  The patient does smoke one pack per day for the past 25 years.  He does not drink alcohol.   PAST SURGICAL HISTORY:  He has not had any surgeries.   REVIEW OF SYSTEMS:  The patient complains of having chills, sweats, and low-  grade fevers for the past 10 days.  He has had easy fatigability, decreased  appetite, some distention of his abdomen, but really he does not get early  satiety.  He has some generalized aches and pains.  He does have history of  lower back pain for which he has been using some patches.  He does complain  of having had some nose  bleeds over the past three months, initially was  seen by Urgent Care and was given some pills; he does not know what, and  then subsequently again he went back and had some nose drops given to him.  He states that it got better.  He has not had any headaches or visual  disturbances.  He has had occasional chest pain but relived easily, some  minimal shortness of breath.  His activity level has been very limited  secondary to easy fatigability.  He has no abdominal pain.  He has not  noticed any lumps or bumps.   PHYSICAL EXAMINATION:  GENERAL:  The patient is awake, alert, very tired and  pale-appearing male with obvious jaundice.  VITAL SIGNS:  Temperature 99.9; prior to that, it was 98.5.  Blood pressure  107/67, pulse 99, respirations 24, O2 saturation 100%.  HEENT:  Normocephalic and atraumatic.  EOMI.  PERRLA. Sclerae icteric.  There is conjunctival pallor.  Oral mucosa is dry.  There is pallor present.  NECK:  No palpable cervical, supraclavicular, or axillary lymphadenopathy.  LUNGS:  Clear bilaterally.  CARDIOVASCULAR:  Regular rate and rhythm with a 2/6 systolic ejection  murmur.  No gallops or rubs.  ABDOMEN:  Soft, nontender, nondistended.  Bowel sounds are present.  Spleen  can be palpated up to the umbilicus, and liver is also palpable 2  fingerbreadths below the right costal margin.  BACK:  No CVA or spinal tenderness.  EXTREMITIES:  No petechiae, ecchymoses, bruises, and no edema.  NEUROLOGIC:  The patient is alert and oriented x 3.  Cranial nerves II-XII  intact grossly.  DTRs are +4.  Motor and sensory intact.  Strength is  symmetrical bilaterally.  SKIN:  Without any petechiae, rashes, ecchymoses, or bruises.   LABORATORY DATA:  WBC 4.4, hemoglobin 5.4, hematocrit 15.4, MCV 106.6,  platelets 22,000.  Differential includes neutrophils 4, lymphocytes 91,  monocytes 4, eosinophils 1.  Review of the peripheral smear does reveal atypical lymphocytes  with some hairy  appearance of the lymphocytes.  I do  not seen an obvious blasts.  There is polychromatophilia present.  No  schistocytes are noted.  Electrolytes:  Sodium 122, potassium 3.9, chloride  99, bicarb 23, glucose 124, BUN 14, creatinine 0.9, calcium 7.2, total  protein 7.8, albumin 2.2, AST 55, alkaline phosphatase 84, total bilirubin  2.3.  PT 16.4 with INR of 1.5 and PTT 50.  Uric acid, LDH, and flow  cytometry are pending.   IMPRESSION AND PLAN:  A 55 year old otherwise healthy male who now presents  with three-month history of weakness, fatigue, and nose bleeds, with  pancytopenia with white count revealing lymphocytosis of unclear etiology,  although there are atypical lymphocytes present on the peripheral smear.  The patient indeed needs a complete hematologic workup.  I do agree with  getting a CT of the chest, abdomen, and pelvis to rule out  hepatosplenomegaly as is evidenced by my physical examination.  He will also  need flow cytometry on the peripheral blood which has been sent, and we will  await the results of this.  The patient will need a bone marrow biopsy and  aspirate as there are atypical cells seen on the peripheral smear, and a  bone marrow process does need to be ruled out.   I do agree with going ahead and transfusing the patient with packed red  blood cells.  I will hold on having him transfused with platelets as he is  not actively bleeding at this time.  Electrolytes can be collected as your  discretion.   In terms of patient's elevated PT and PTT, he certainly does not have  thrombotic thrombocytopenic purpura.  However, a mxing study should be  performed on the PT and PTT to rule out a coagulopathy, or this could be due  to his liver function test abnormalities and malnutrition.  The patient's  MCV is elevated, and I do agree with getting serum B12 and folate levels.   I will continue to follow him, and I will try to set him up for a bone  marrow biopsy and  aspirate in the morning or some time on Wednesday.  I did  have a long discussion with the patient.  He does understand that I have  been called to rule out a hematologic process such as an acute leukemia or a  chronic leukemia or lymphoma.  He understands the gravity of this situation.   Thank you for allowing me to participate in the care of this patient.                                               Drue Second, MD    KK/MEDQ  D:  07/29/2003  T:  07/29/2003  Job:  [id number]   cc:   [facility]   Hollice Espy, M.D.

## 2010-10-23 NOTE — Consult Note (Signed)
NAMEORLONDO, HOLYCROSS            ACCOUNT NO.:  [id number]   MEDICAL RECORD NO.:  [id number]          PATIENT TYPE:  EMS   LOCATION:  ED                           FACILITY:  [facility]   PHYSICIAN:  Jesse Sans. Wall, MD, FACCDATE OF BIRTH:  [birth year]   DATE OF CONSULTATION:  03/24/2006  DATE OF DISCHARGE:                                   CONSULTATION   REASON FOR CONSULTATION:  I was asked by Dr. Nehemiah Settle to evaluate Mr.  Kenley for chest pain, rule out cardiac etiology.   HISTORY OF PRESENT ILLNESS:  Mr. Carrera has been having some sharp chest  pain that is pulsating over the left breast.  He has had some aching in the  left arm.  He has had no other associated symptoms.  This has been going on  for a couple of days off and on.  It is not exertion related.  There is no  shortness of breath.  He denies any fever, chills, sweats, productive cough.  He has had no hemoptysis.  He denies any reflux symptoms, any dysphasia, any  melena or hematochezia.  He has no history of coronary disease.   He came to [facility].  He still there.  His initial vital  signs were unremarkable.  O2 sat was 100% on room air.  An EKG is  essentially normal.  His initial blood work shows one set of cardiac enzymes  to be negative.  His potassium is borderline low at 3.5.  CBC is  unremarkable. D-dimer is actually high, greater than 20 and they are  repeating it as we speak.  Chest x-ray shows normal heart and COPD changes.   His cardiac risk factors include male sex, increasing age, and tobacco use.  His father had a heart attack his age in his mid 73s.   PAST MEDICAL HISTORY:  He has a history of hairy cell leukemia follow Dr.  Welton Flakes, he is currently in remission.  He has a history of hepatitis B and is  currently on chronic therapy for this.   ALLERGIES:  He has a contrast allergy which causes swelling and rash.   SOCIAL HISTORY:  He does not drink.  He does smoke and has since his  early  61s.  He is married.  He has three children.  He lives in [place].  He  works at [employer] in [employer].  He is under lot of stress.   PAST SURGICAL HISTORY:  He has had a splenectomy.   FAMILY HISTORY:  His father had an MI at age 10.   REVIEW OF SYSTEMS:  HEENT:  He does not wear glasses.  He has had no visual  complaints.  No difficulty speaking. No loss of hearing.  NECK:  He has had  no swelling.  He has had no masses. No trouble swallowing.  CARDIOVASCULAR:  See HPI.  PULMONARY:  He has had no significant dyspnea on exertion.  See  HPI.  ABDOMINAL:  He has had no abdominal pain.  He has had no constipation,  diarrhea, melena, or hematochezia. No reflux symptoms.  ENDOCRINE:  Negative  for history of heat intolerance or being cold. No loss of weight recently.  No tachy palpitations.  GU:  No urinary frequency, hematuria, or any history  of any GU abnormality in the past.  MUSCULOSKELETAL:  No arthralgias,  myalgias.  He has had no recent injury.   PHYSICAL EXAMINATION:  GENERAL:  He is in no acute distress.  VITAL SIGNS: Blood pressure 120/79, his pulse is 72.  He is in sinus rhythm.  O2 sats 100%.  His respiratory rate is 18 and unlabored.  SKIN:  Warm and dry.  HEENT:  Normocephalic, atraumatic.  PERLA.  Extraocular is intact.  Sclerae  are slightly pale.  Facial symmetry is normal. Dentition is satisfactory.  NECK:  Carotids were equal bilaterally without bruits.  No JVD.  Thyroid is  not enlarged.  Trachea is midline.  There is no lymphadenopathy.  LUNGS:  Clear to auscultation and percussion.  There is no rub.  HEART:  Reveals regular rate and rhythm.  No murmur, rub, or gallop.  ABDOMEN:  Soft, good bowel sounds.  No midline bruit.  There is no  hepatomegaly.  He is status post splenectomy.  EXTREMITIES:  No cyanosis, clubbing, or edema.  Pulses are brisk.  NEUROLOGICAL:  Exam is intact.   EKG, laboratory data, as well as chest x-ray, see HPI.    ASSESSMENT:  1. Probable noncardiac chest pain.  2. Cardiac risk factors of increasing age, male sex, and tobacco use.  3. History of hepatitis B.  4. History of hairy cell leukemia status post splenectomy currently in      remission followed by Dr. Welton Flakes.  5. Borderline hypokalemia.  6. Elevated D-dimer, question significance.  It will be repeated.  7. Contrast allergy.   RECOMMENDATIONS:  1. Admit to telemetry.  2. Check serial enzymes.  Make sure to repeat D-dimer, if it is really      markedly positive, CT angio or VQ scan might be better with his      contrast allergy.  I will leave this to Dr. Nehemiah Settle.  3. Check lipid profile.  4. Agree with aspirin 325 mg a day.  5. Assuming he rules out for myocardial infarction, we will arrange for an      outpatient Myoview in the office.   Thank you for the consultation.      Thomas C. Daleen Squibb, MD, [facility]  Electronically Signed     TCW/MEDQ  D:  03/24/2006  T:  03/24/2006  Job:  [id number]   cc:   Thomas C. Wall, MD, FACC  [address]  [address]  [place]  [place] [address]   Deirdre Peer. Polite, M.D.

## 2010-10-23 NOTE — Op Note (Signed)
[name], [name]            ACCOUNT NO.:  [id number]   MEDICAL RECORD NO.:  [id number]          PATIENT TYPE:  INP   LOCATION:  1606                         FACILITY:  [facility]   PHYSICIAN:  Adolph Pollack, M.D.DATE OF BIRTH:  [birth year]   DATE OF PROCEDURE:  06/14/2005  DATE OF DISCHARGE:                                 OPERATIVE REPORT   PREOPERATIVE DIAGNOSIS:  Splenomegaly and leukemia.   POSTOPERATIVE DIAGNOSIS:  Splenomegaly and leukemia.   PROCEDURES:  Splenectomy.   SURGEON:  Adolph Pollack, M.D.   ASSISTANT:  Consuello Bossier M.D.   ANESTHESIA:  General.   INDICATIONS:  [name] is a 55 year old male with hairy cell leukemia  and progressive splenomegaly with problems with leukopenia and  thrombocytopenia secondary to the splenomegaly. It is felt he would benefit  from a splenectomy and now presents for that. We discussed the procedure and  risks preoperatively.   TECHNIQUE:  He was seen in the holding area and then brought to the  operating room, placed supine on the operating table and a general  anesthetic was administered. Hair in the abdominal wall was clipped and the  area was sterilely prepped and draped. A midline incision was made beginning  at the xiphoid process and extending just below the umbilicus through the  skin, subcutaneous tissue, fascia and peritoneum. Once entering the  peritoneal cavity, a Bookwalter retractor was used for exposure. He had  massive splenomegaly as was known. I began immediately entering the lesser  sac and using the harmonic scalpel and clips dividing short gastric vessels.  I identified the splenic artery and ligated it with a #0 silk tie. I  continued my dissection close to the splenic capsule using the harmonic  scalpel to divide the splenocolic ligament as well as small medial vessels.  I then rotated the spleen superiorly and divided the lateral attachments  using the harmonic scalpel. There were a  number of venous tributaries going  into the spleen and I sequentially tried to ligate these and divide them and  this is where we had some bleeding off and on. There was also some slow ooze  from the splenic bed. I gently was able to free up the tail of the pancreas  with the splenic hilum as it very close to it. I then identified the some  branches of the splenic artery and branches of the splenic vein. These were  then clamped and divided and the spleen was removed after diaphragmatic  attachments were divided. The spleen was intact. The vessels were then  ligated with the silk ties. Following this, I inspected the area. There was  a little bit of bleeding from the blood vessel of the tail of pancreas that  was ligated. There was bleeding from the splenic bed posteriorly which I  controlled with electrocautery. There was one short gastric vessel in the  stomach that had some bleeding and I controlled this with a silk suture  ligature. I then irrigated out the wound. The splenic bed and the tail of  pancreas were then covered with FloSeal and Surgicel with good results.  Hemostasis was adequate at this time and needle, sponge and instrument  counts were reported to be correct at this time.   The fascia was then closed with a running #1 PDS suture. The subcutaneous  tissue was irrigated and skin was closed staples and a sterile dressings  applied.   He tolerated the procedure well without any apparent complications. He was  taken to the recovery room in satisfactory condition. Estimated blood loss  was approximately 800 mL.      Adolph Pollack, M.D.  Electronically Signed     TJR/MEDQ  D:  06/14/2005  T:  06/15/2005  Job:  [id number]   cc:   Drue Second, MD  Fax: [phone]

## 2010-10-23 NOTE — Discharge Summary (Signed)
[name], [name]                        ACCOUNT NO.:  [id number]   MEDICAL RECORD NO.:  [id number]                   PATIENT TYPE:  INP   LOCATION:  0279                                 FACILITY:  [facility]   PHYSICIAN:  Drue Second, MD                    DATE OF BIRTH:  [birth year]   DATE OF ADMISSION:  07/29/2003  DATE OF DISCHARGE:  08/29/2003                                 DISCHARGE SUMMARY   DISCHARGE DIAGNOSES:  1. Hairy cell leukemia.  2. Pancytopenia secondary to #1.  3. Hepatosplenomegaly secondary to #1.  4. Rectal abscess.  5. Fever.  6. Rash secondary to drug reaction.  7. Positive PPD.   PATIENT IDENTIFICATION:  [name] is a 54 year old Arab male who is  admitted for severe fatigue, nosebleeds, and weight loss.  He was also  having some night sweats and was admitted through the emergency room.  He  was found to have severe anemia and was worked up immediately for his  abnormalities.   HOSPITAL COURSE:  On the day of admission hematology/oncology was consulted  and Dr. Welton Flakes saw the patient.  The patient had a bone marrow aspiration and  biopsy which indicated hairy cell leukemia.  He was having severe pain  around the rectum and was found to have a rectal abscess.  He was placed on  Primaxin. On July 31, 2003 he was seen by general surgery for the  abscess and on that same day he was noted to have a fever of 103.8.  His  pancytopenia stabilized but the rectal pain continued.  He also had problems  of bilateral lower extremity edema.  Dopplers of the lower extremities were  negative.  The perirectal abscess was drained by general surgery on August 03, 2003 and chemotherapy of 2-CdA was started on the same day.  He  continued to require blood products because of the pancytopenia.  He had  been started on allopurinol because of an elevated uric acid level on  admission.  On August 05, 2003 he had a PICC line placed by interventional  radiology.  He  continued on the Primaxin and was tolerating the chemotherapy  very well.  His neutropenia worsened by August 07, 2003 and Neupogen was  added.  He began having a rash around August 06, 2003 that began as a truncal  rash and was nonpruritic but worsened over the next several days.  His  temperature spiked again on August 08, 2003 and cultures were obtained and  vancomycin was started.  He was seen by dermatology on August 09, 2003 to  evaluate the rash.  He was found to have thrush on August 10, 2003 and was  started on Diflucan  He remained febrile and continued to have the rash and  lower extremity edema.  Doppler ultrasound was repeated on August 12, 2003 for  bilateral lower extremities which were negative.  The rectal abscess  continued to improve with the antibiotics and after the drainage as well as  sitz baths.  Zithromax had been added and the Diflucan had been discontinued  with the addition of voriconazole on August 13, 2003 and an infectious disease  consult was obtained.  Physical therapy began to see the patient because of  the severe debility caused by the illness.  He had some right upper  extremity edema and on August 13, 2003 Doppler was obtained which was  negative.  Neupogen continued as well as all of the antibiotics and  antifungals.  On August 15, 2003 patient had begun having hallucinations and  the next day was found by nursing staff covered in blood after he had cut  his IV tubing and blood was running from that.  He was evaluated by  psychiatry.  It was suspected that he had toxic metabolic encephalopathy  secondary to the overall stress.  It was recommended that the Haldol be  tapered.  He became afebrile.  He continued to have some problems with  confusion but overall better over the next few days; no more attempts at  pulling out his IVs were made.  The thrush had greatly improved.  General  surgery, infectious disease, and dermatology continued to follow the  patient's  progress.  By August 21, 2003 his confusion was much better and he  was in much better spirits.  Although he was still on the Neupogen he  continued to have low blood counts.  His rash was slowly improving and the  neutropenia gradually improved.  On March 18 Dr. Orvan Falconer recommended to  discontinue the vancomycin, voriconazole, Cipro, and Flagyl.  Even prior to  the hospitalization he was found to have a positive PPD and was on INH.  The  psychosis improved after tapering the Haldol.  Dr. Orvan Falconer recommended  discontinuing the Septra which had been started for PCP prophylaxis.  By  August 29, 2003 he was greatly improved and his white count had gone up to  1.5 with an ANC of 900.  His rash was improved and lower extremity edema was  decreased.  He is discharged to his home with his family with normal  precautions including to wear a mask when in crowds, avoid crowds if  possible, and avoid people who are ill.   PROCEDURES DURING THE HOSPITALIZATION:  1. Bone marrow aspiration and biopsy on July 30, 2003 consistent with     hairy cell leukemia.  2. Perirectal abscess I&D on August 03, 2003.  3. On August 03, 2003 he received 2-CdA chemotherapy IV infusion for 5     days.  4. PICC line was inserted in the right upper extremity on February 28.  5. He had a bilateral lower extremity Doppler ultrasound that was negative     on February 28 and then repeated on August 12, 2003 that was negative.  6. On August 13, 2003 right arm Doppler was negative for DVT.  7. He received blood products on February 21 of packed red cells that were     leukocyte-reduced; on February 22, packed red cells, leukocyte-reduced;     on February 26, platelets, pheresed and leukocyte-reduced; on February     26, fresh frozen plasma; on February 27, packed red cells that were     irradiated and leukocyte-reduced; on March 1, fresh frozen plasma; on    March 2, packed red cells; March 5, platelets that were  leukocyte-reduced  and irradiated; March 8, packed red cells; March 11, packed red cells and     platelets; and March 21, packed red cells.  All blood products were     leukocyte-reduced.   LABORATORY DATA:  On admission, his white count was 4.4; hemoglobin was 5.5;  hematocrit 15.4; elevated MCV of 107; and platelet count was 22,000.  His  sodium was 122, potassium 3.9, total bilirubin was 2.3, and LFTs were within  normal limits.  Total protein was 7.8 and albumin was 2.2.  LDH 230, and  serum uric acid was 3.4.  Labs on August 29, 2003:  White count is 1.5;  hemoglobin 10.9; hematocrit 31.9; platelet count is 62,000.  ANC was 900.  Sodium 132, potassium 4.3, chloride and CO2 102 and 29, BUN 14, creatinine  0.6, and glucose was 92, bilirubin was 2.9.  There were multiple counts  drawn during his hospitalization and overall he is much improved on  discharge.  Blood cultures on March 3, March 6, March 8 were all negative.  Bleeding studies were done throughout the hospitalization and PT/INR last  done on March 14 was 16.9.  A PTT on August 19, 2003 was 43.  Factor XI  assays were low at 28, factor V was low at 48, factor VII low at 25, factor  X high at 211.  Radiology, July 29, 2003:  CT of the abdomen and pelvis  shows massive confluent intraabdominal adenopathy with massive splenomegaly.  Chest x-ray the same day was negative.  On February 23, chest x-ray:  Bibasilar opacities.  August 01, 2003, PET scan:  Moderate increased  metabolic activity in the liver and spleen and in the bulky retroperitoneal,  celiac, and periportal nodes; intense increased uptake localized to the  lower rectum and anus.  August 02, 2003, CT of the pelvis:  Marked  splenomegaly and adenopathy.  Portable cervix, March 3:  Poor aeration with  basilar atelectasis.  August 11, 2003:  Bibasilar effusions.  CT of the  abdomen and pelvis on August 12, 2003:  Interval improvement in the  periportal, celiac, and  retroperitoneal adenopathy; stable splenomegaly;  interval development of anasarca; increased inguinal external iliac  adenopathy compared to old films.  Ultrasound of the abdomen on August 12, 2003:  Splenomegaly and lymphadenopathy is noted; the gallbladder showed  thickening and sludge; right renal cysts were present.  August 21, 2003,  chest x-ray:  Small bilateral pleural effusions.  On August 03, 2003 a  swab of the rectal abscess grew out E. coli.   DISCHARGE MEDICATIONS:  1. Lasix 20 mg one p.o. q.a.m.  2. Actigall 300 mg one t.i.d.  3. Isoniazid 300 mg one daily.  4. Vitamin B6 50 mg one every day.  5. Compazine 10 mg one q.6h. p.r.n. nausea.  6. Tequin 400 mg one daily until all gone.  7. Valacyclovir one t.i.d. until gone.   CONDITION AT DISCHARGE:  Much improved.   DIET:  Without restrictions.  He should add Ensure Plus three times a day.   ACTIVITY:  As tolerated but he should avoid crowds or people who are sick.  FOLLOW-UP:  1. With Dr. Welton Flakes in the [facility] on Monday, September 02, 2003 at     1:30 p.m.  2. He also has an appointment on March 25, March 26, and March 27 for     Neupogen injections as well as his PICC line flush.  3. He should follow up with Dr. Jamey Ripa  in 1-2 weeks.  4. We will also obtain a CBC on March 25, March 26, and March 27 with the     results to be called to the on-call M.D.     Melony Overly, [place]                          Drue Second, MD    TH/MEDQ  D:  08/29/2003  T:  08/30/2003  Job:  [id number]   cc:   Currie Paris, M.D.  [address], [address]  [place]  [place] [address]  Fax: [phone]   Cliffton Asters, M.D.  [address] [place]  [place] [address]  Fax: [phone]   Rocco Serene, M.D.  [address] [place]  [place] [address]  Fax: [phone]   Antonietta Breach, M.D.  [address]  [place], [place] [address]  Fax: [phone]

## 2010-10-23 NOTE — H&P (Signed)
NAMECLAUDELL, [name]            ACCOUNT NO.:  [id number]   MEDICAL RECORD NO.:  [id number]          PATIENT TYPE:  INP   LOCATION:  0113                         FACILITY:  [facility]   PHYSICIAN:  Jackie Plum, M.D.DATE OF BIRTH:  09/08/1955   DATE OF ADMISSION:  03/24/2006  DATE OF DISCHARGE:                                HISTORY & PHYSICAL   CHIEF COMPLAINT:  Chest pain.   HISTORY OF PRESENT ILLNESS:  The patient is a 55 year old gentleman who  presented with 2 days of intermittent chest pain.  He has had several  episodes of moderate chest pain lasting 4 hours which is said to be left-  sided, associated with numbness of the left arm.  He does not have any  associated diaphoresis, nausea or vomiting, or any hypertension, dizziness.  The patient denies any associated radiation as far as he can tell, except  for numbness of the left arm.  He has not had any shortness of breath.  There is no history of diarrhea or constipation.  On account of the chest  pain, the patient came to the ED.  In the emergency room, the patient was  seen by Dr. Doug Sou.  A 12-lead EKG was obtained which showed normal  sinus rhythm at 64 beats per minute any acute ST wave changes.  The patient  was admitted for further evaluation and management.   PAST MEDICAL HISTORY:  1. Positive hepatitis C.  2. Hairy cell leukemia.   ALLERGIES:  IVP dye and iodine containing contrast dyes.   MEDICATIONS:  Tyzeka 600 mg daily.   FAMILY HISTORY:  Heart disease in his father who had a myocardial infarction  in his 44s.   SOCIAL HISTORY:  The patient is a [place] immigrant who has been in the [place]  for more than 20 years.  He smokes 1 pack of cigarettes daily for several  years.  He lives with his wife and children.  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   REVIEW OF SYSTEMS:  Significant positive and negatives as listed above,  otherwise unremarkable.   PHYSICAL EXAMINATION:  VITAL SIGNS:  Blood  pressure 119/56, pulse 62,  respirations 18, temperature 96.6 degrees Fahrenheit.  O2 saturation 98%.  GENERAL: The patient was comfortable, not in acute distress.  He was chest  pain free.  HEENT:  Normocephalic, atraumatic.  Pupils are equal, round, reactive to  light.  Extraocular movements are intact.  Oropharynx moist.  NECK:  Supple.  No JVD.  LUNGS:  Clear to auscultation.  CARDIAC:  Regular rate and rhythm.  No gallop or murmur.  ABDOMEN:  Soft and nontender.  Bowel sounds present.  EXTREMITIES:  No cyanosis, no edema.  NEUROLOGIC:  Nonfocal.   LABORATORY DATA:  The cardiac markers were within normal limits.  The CBC  was within normal limits and so was the basic metabolic panel.  X-ray shows  COPD.  EKG as noted above.   IMPRESSION:  Chest pain in a patient who has coronary artery disease risk  factors of male, sex, age more than 45 years, and cigarette smoking as  well  as possible family history.  The patient is admitted for rule out protocol  with cycling of cardiac enzymes.  He is chest pain free now.  He will be  made n.p.o. for possible stress test in the morning.  He was seen by the  smoking cessation team for counseling.  He understands that cigarette  smoking __________ on his general well being and increasing his cardiac  status.  Will start him on a nicotine patch while he is here.  The patient  has COPD.  He does seem to be aware of this diagnosis and will need  outpatient followup.      Jackie Plum, M.D.  Electronically Signed     GO/MEDQ  D:  03/24/2006  T:  03/24/2006  Job:  [id number]

## 2010-10-23 NOTE — Discharge Summary (Signed)
[name]            ACCOUNT NO.:  [id number]   MEDICAL RECORD NO.:  [id number]          PATIENT TYPE:  INP   LOCATION:  1506                         FACILITY:  [facility]   PHYSICIAN:  Michaelyn Barter, M.D. DATE OF BIRTH:  August 30, 1955   DATE OF ADMISSION:  10/23/2005  DATE OF DISCHARGE:  10/24/2005                                 DISCHARGE SUMMARY   PRIMARY CARE PHYSICIAN:  Unassigned.   FINAL DIAGNOSES AT TIME OF DISCHARGE:  1.  Left lower lobe pneumonia.  2.  Hyponatremia.  3.  Elevated liver enzymes.   HISTORY OF PRESENT ILLNESS:  [name] is a 55 year old gentleman, with  a past medical history of hairy cell leukemia that is in a remission for the  past two years, who presented to the emergency department with a chief  complaint of sudden onset of fevers, chills, and cough with a production  over the course of one day.  He also complained of some myalgias in his arms  and legs.  He denied having any nausea, vomiting, fevers, or chills, no  chest pain, no shortness of breath or diaphoresis.  For past medical  history, please see that dictated by Dr. Della Goo on Oct 23, 2005.   HOSPITAL COURSE:  1.  Left lower lobe pneumonia:  The patient had a chest x-ray completed,      which revealed left lower lobe bronchial pneumonia superimposed upon      COPD.  He was started on Azithromycin 500 mg p.o. and Rocephin 1 gm IV      q.24h.  Over the 24 hours of his hospitalization, he states that his      cough has improved and feels as though he feels much better today than      at the time of his admission.  He adamantly states that he wants to go      home today.  2.  Hyponatremia:  When the patient initially arrived into the hospital, his      sodium level was noted to 128.  This improved over the course of his      hospitalization and todayit is 139.  The patient was asymptomatic      secondary to the hyponatremia.  IV fluids were provided, and this may  have aided in the resolution of his hyponatremia.  3.  History of hepatitis:  The patient indicates that he was told by his      oncologist that he had hepatitis-B.  His labs indicate that he had      presented with a total bilirubin of 1.2, an alk-phos of 112, an SGOT of      63, and an SGPT of 102 confirming that the patient may have liver      disease.  The patient has had an ultrasound completed as far back as      October 01, 2005, and the reason for that ultrasound was elevated LFTs.      At that particular time, the radiologist's final impression was that      there was suspicion for diffuse hepatocellular disease and at  that point      in time indicated that there was diffuse heterogenous hepatic      parenchymal echotexture compatible with diffuse hepatocellular disease.      Likewise, the patient had an MRI completed on Oct 14, 2005, which      revealed a diffuse hepatocellular micronodular pattern with a      predominantly low signal on two two-weight images.  There is also noted      to be perihepatic ascites, but no porta hepatis adenopathy is      identified, and there is no periportal or perihepatic edema.  Hepatic      enhancement suggests some diffuse, accentuated, interstitial enhancement      especially peripherally, which has an appearance of hepatitis or      possibly early cirrhosis.  Correlation with serologies and a biopsy      should also be considered.  The patient indicated that he has a followup      appointment with Gastroenterology on July 17th at which time he could      undergo further evaluation.   CONDITION AT TIME OF DISCHARGE:  Again, the patient indicates that he feels  better and he is adamant that he wants to go home.  I spent at least 5 to 10  minutes trying to convince the patient to stay just for 24 more hours so  that he could be observed closer.  The patient indicated that his three  children were here, his wife was here, and he needed to be with  them.  Despite how much convincing I attempted to do, the patient indicated that he  was going home today.  I explained to the patient that since he was going  home I will give him antibiotics for 10 days, and I asked him to please  promise me that if he developed fevers or if his cough became worse or if he  developed shortness of breath to please come back to the hospital or be  admitted, and he indicated that he would.  Therefore, the decision has been  made to discharge the patient home.  The patient will be discharged home on  Moxifloxacin 400 mg p.o. daily.  I will give him a 10-day supply.  Likewise,  I will give him a prescription for Combivent MDI.      Michaelyn Barter, M.D.  Electronically Signed     OR/MEDQ  D:  10/24/2005  T:  10/24/2005  Job:  [id number]

## 2010-10-23 NOTE — H&P (Signed)
NAMETAYTON, DECAIRE                        ACCOUNT NO.:  [id number]   MEDICAL RECORD NO.:  [id number]                   PATIENT TYPE:  INP   LOCATION:  0101                                 FACILITY:  [facility]   PHYSICIAN:  Hollice Espy, M.D.            DATE OF BIRTH:  [birth year]   DATE OF ADMISSION:  07/29/2003  DATE OF DISCHARGE:                                HISTORY & PHYSICAL   CHIEF COMPLAINT:  Fatigue.   HISTORY OF PRESENT ILLNESS:  This is a 55 year old Arab male with no past  medical history who reports that over the last 3 months, he has noted some  symptoms of increasing fatigue. He has had episodes of recurrent nose bleeds  and he does note to some approximately a 20 pound weight loss although he  says he has been eating a little bit less. The patient, over the last few  weeks, has also noted night sweats. He came in to the emergency room today  when all these symptoms persisted and finally decided to have worked up.  Additionally, he had been reporting that over the last few weeks, he had  been noticing some increased lower leg edema. He went to an urgent care  center and was taking some Lasix as needed. His symptoms continued to  persist, so he finally came into the emergency room today. On admission the  patient was noted to be slightly jaundiced in appearance. Most concerning,  however, was the patient's lab work as he was noted to have a hemoglobin of  5.4 with a hematocrit of 15.4. In addition, his platelet count was only  22,000. The patient's peripheral smear was sent off for pathology. Atypical  lymphocytes were noted and further flow cytometry was recommended. During  patient's admission, he was also complaining of some mild chest pressure  that did resolve. The patient otherwise was put on some oxygen, started on  IV fluids and started to feel better. Further lab work was sent off. CPK, MB  and troponin were normal. However, the patient was noted to have  slightly  elevated PT and PTT. Other lab work which was remarkable was that patient  had a total bilirubin of 3.3, an AST of 55, albumin of 2.2. Serum LDH was  normal at 230 and a serum uric acid was normal at 3.4. The patient otherwise  has no other complaints. He denies any headaches, vision changes. He has  been feeling light headed as of late. He denies any nausea, vomiting, or  dysphagia. He denies any palpitation or sharp chest pain other than the  brief episode of chest pressure. He did note that his chest pressure  radiated down to his left arm. He does complain of occasional shortness of  breath. He denies any productive cough or wheezing. He denies any abdominal  pain. He denies any extremity pain, hematuria, dysuria, constipation, or  diarrhea although he feels like he has  what he calls a rectal abscess in the  area surrounding his buttock and rectal region. A rectal heme examination  was performed by myself and was found to be heme negative.   PAST MEDICAL HISTORY:  None.   MEDICATIONS:  The patient has lately been taking p.r.n. Lasix but otherwise  does not take any medications.   ALLERGIES:  None.   SOCIAL HISTORY:  The patient denies any alcohol or drug use. He does note  that he has been smoking about a pack a day, if not more, for the past 20  years.   FAMILY HISTORY:  Negative for any types of cancer. No other heart disease or  diabetes is noted in the family.   PHYSICAL EXAMINATION:  GENERAL:  This is an alert and oriented [place] male who  looks slightly emaciated. He does appear jaundiced. Otherwise, no apparent  distress.  VITAL SIGNS:  Temperature 98.5, respiratory rate 24, heart rate 99, blood  pressure 107/67. His weight is 159.  HEENT:  Normocephalic, atraumatic. He does have positive scleral icterus.  Mucous membranes are slightly dry. He has no carotid bruits.  HEART:  Regular rate and rhythm. S1 and S2.  LUNGS:  Clear to auscultation bilaterally.   ABDOMEN:  Soft, nontender, nondistended. Positive bowel sounds. I am able to  appreciate what I believe is 2 cm hepatomegaly and splenomegaly. He has no  tenderness in either of these areas.  EXTREMITIES:  He has 1+ pitting edema. He has good 2+ pulses.   LABORATORY DATA:  A 2 view of the chest shows evidence of no active disease.  Lungs are clear. Heart is normal in size.   Serum uric acid is 3.4, LDH 230. Troponin I less than 0.01. CPK and MB is 41  and 0.5. Previous set of cardiac enzymes is also negative. The patient is  noted to have a white count of 4.4, hemoglobin and hematocrit of 5.4 and  15.4 with an elevated MCV of 107. Platelet count is 22,000. Sodium 122,  potassium 3.9, chloride 99, bicarb 23, BUN 14, creatinine 0.9, glucose 125.  His total bilirubin is 2.3, alkaline phosphatase 84, AST 55, ALT 30, total  protein 7.8, albumin 2.2, and calcium 7.2.   ASSESSMENT/PLAN:  1. Decreased hemoglobin and hematocrit. Given his negative history of any     hematochezia or melena and his rectal examination being heme negative,     gastrointestinal bleed is low on my differential diagnosis. The number     one suspicion is for hematologic malignancy. However, given an elevated     bilirubin, could possibly make an argument for autoimmune disease causing     hemolysis, which would concur with his jaundice. He also may have some     form of liver disease, as he has slightly elevated AST. He appears to     have some hepatomegaly and he does appear to be jaundice, which would     also account for low platelets as well. But however, again, my primary     suspicion is for hematologic malignancy. I did not comment on this.     However, in the addendum to his physical examination, I want to note that     he does have a right submandibular lymph node and a left axillary groin     node that were slightly palpable. Otherwise, I could not palpate any    other lymph nodes. I have asked  hematology/oncology to see. Given the  patient's chest pain, I am concerned for ischemia and am going to go     ahead and transfuse 2 units. A flow cytometry will be checked prior to     the units of blood being given. Differential diagnoses would be leukemia     or lymphoma.  2. In regard to patient's decreased platelet count, currently he shows no     sign of active bleeding, although he does indeed have quite a low     platelet count and has a history of recurrent nose bleeds. For right now,     I am concerned with his hematologic condition. This may be some sign of     diffuse intravascular coagulation as well and would not prefer to give     platelets until this patient falls below 10 or is actively bleeding. For     now, will type and cross and await transfusion.  3. Noted increased MCV. Will check a B12 and folate level.  4. Hepatosplenomegaly, which may be a component of a malignancy. Will check     a CT of the abdomen and pelvis.  5. With regards to the patient's chest pain, will follow with series of     cardiac enzymes. This may be ischemia although his electrocardiogram  is     completely normal as are his cardiac enzymes. I suspect that this is just     secondary to his anemia and this type of ischemia should improve with     transfused blood. Obviously, his workup is still early in progress and     the patient's prognosis and possible treatment options will be better     defined once we realize what the cause of his anemia is. And if it is     malignancy, what type.                                               Hollice Espy, M.D.    SKK/MEDQ  D:  07/29/2003  T:  07/30/2003  Job:  [id number]   cc:   Sherin Quarry, MD   Drue Second, MD  Fax: [phone]

## 2010-10-23 NOTE — Op Note (Signed)
[name], [name]                        ACCOUNT NO.:  [id number]   MEDICAL RECORD NO.:  [id number]                   PATIENT TYPE:  AMB   LOCATION:  OMED                                 FACILITY:  [facility]   PHYSICIAN:  Drue Second, MD                    DATE OF BIRTH:  [birth year]   DATE OF PROCEDURE:  11/07/2003  DATE OF DISCHARGE:  11/07/2003                                 OPERATIVE REPORT   PROCEDURE:  Bone marrow biopsy and aspirate.   INDICATIONS FOR PROCEDURE:  Patient with a hairy cell leukemia, rule out  relapse.   TECHNIQUE:  After obtaining a written consent, the patient was brought to  the day hospital, he was placed on continuous monitoring of blood pressure  and pulse ox and heart rate.  IV was inserted and he was given 4 mg of  Versed for conscious sedation.  He was placed in a right lateral position.  Utilizing the left posterior iliac crest, it was prepped and draped in the  usual manner, 2% Xylocaine was used as a local anesthetic.  With aspirate  needle, 10 mL of aspirate was obtained. A core biopsy was attempted but  could not be obtained.  Post procedure, the patient was bandaged and he was  placed in a supine position and discharged to home.  He tolerated his  procedure well.  He has followup set-up with me in the [facility] once his bone marrow results are available.                                               Drue Second, MD    KK/MEDQ  D:  11/26/2003  T:  11/26/2003  Job:  [id number]

## 2011-02-03 ENCOUNTER — Ambulatory Visit (INDEPENDENT_AMBULATORY_CARE_PROVIDER_SITE_OTHER): Payer: Medicaid Other | Admitting: General Surgery

## 2011-02-11 ENCOUNTER — Other Ambulatory Visit: Payer: Self-pay | Admitting: Gastroenterology

## 2011-02-11 ENCOUNTER — Ambulatory Visit (INDEPENDENT_AMBULATORY_CARE_PROVIDER_SITE_OTHER): Payer: Medicaid Other | Admitting: Gastroenterology

## 2011-02-11 DIAGNOSIS — B181 Chronic viral hepatitis B without delta-agent: Secondary | ICD-10-CM

## 2011-02-12 LAB — HEPATITIS B SURFACE ANTIGEN: Hepatitis B Surface Ag: POSITIVE — AB

## 2011-02-12 LAB — HEPATITIS B SURFACE ANTIBODY,QUALITATIVE: Hep B S Ab: NEGATIVE

## 2011-02-12 LAB — HEPATIC FUNCTION PANEL
ALT: 23 U/L (ref 0–53)
Albumin: 4.5 g/dL (ref 3.5–5.2)
Total Protein: 7.1 g/dL (ref 6.0–8.3)

## 2011-02-12 LAB — HEPATITIS B DNA, ULTRAQUANTITATIVE, PCR

## 2011-02-12 LAB — AFP TUMOR MARKER: AFP-Tumor Marker: <1.3 ng/mL (ref 0.0–8.0)

## 2011-02-15 LAB — HEPATITIS B E ANTIGEN: Hepatitis Be Antigen: NEGATIVE

## 2011-02-15 LAB — HEPATITIS B E ANTIBODY: Hepatitis Be Antibody: POSITIVE — AB

## 2011-02-22 ENCOUNTER — Ambulatory Visit (INDEPENDENT_AMBULATORY_CARE_PROVIDER_SITE_OTHER): Payer: Medicaid Other | Admitting: General Surgery

## 2011-02-22 ENCOUNTER — Telehealth (INDEPENDENT_AMBULATORY_CARE_PROVIDER_SITE_OTHER): Payer: Self-pay

## 2011-02-25 NOTE — Progress Notes (Signed)
[name], [name]    MR#:  [id number]      DATE:  02/11/2011  DOB:  [birth year]    cc:     Referring physician:  Sigmund Hazel, MD  [facility] Medicine at [facility] [address] [place], [place]   [address] Fax:  [phone]   CONSULTING PHYSICIANS:  Adolph Pollack, MD  [facility]  [address] [place], [place]   [address] Fax:  [phone]   REASON FOR VISIT:  Follow up of E antibody positive hepatitis B virus.    HISTORY:  The patient returns today unaccompanied. He continues on telbivudine for his E antibody positive HBV. The previous ultrasound on 06/25/2008 suggests cirrhosis. There are currently no symptoms to suggest decompensated liver disease, nor are there symptoms to suggest vasculitis or other symptoms referable to his history of hepatitis B. He has never been screened for varices, though he has also never bled.    Past medical history:  Significant for splenectomy on 06/14/2005 for pancytopenia. He says he is due to see Dr. Abbey Chatters, his surgeon, again next week possibly regarding hernia at the incision site.   CURRENT MEDICATIONS:  Telbivudine 600 mg p.o. daily.   ALLERGIES:  Contrast media, though he has previously had an MRI with and complications.   HABITS:  Smokes approximately half pack cigarettes daily.  Denies interval alcohol interval consumption.   REVIEW OF SYSTEMS:  All 10 systems reviewed today with neck Ms. with. Michael anion negative other than which was mentioned above. CES-D was not was 12.   PHYSICAL EXAMINATION:  Constitutional:  Well-appearing without significant bitemporal wasting.   Vital signs:  Height 68 inches, weight 182 pounds, blood pressure 112/77, pulse 78, temperature 97 degrees Fahrenheit.  Ears,  nose, mouth and throat:  Unremarkable oropharynx.  No thyromegaly or neck masses.  Chest:  Resonant to percussion.  Clear to auscultation.  Cardiovascular:  Heart sounds  normal S1, S2 without murmurs or rubs.   There is no peripheral edema.  Abdominal:  Normal bowel sounds.  No masses or tenderness.  I could not appreciate a liver edge or spleen tip.  There appeared to be a hernia at the midline incision from the  patient's previous splenectomy.  Lymphatics:  No cervical or inguinal lymphadenopathy.  Central Nervous System:  No asterixis or focal neurologic findings.  Dermatologic:  Anicteric without palmar erythema  or spider angiomata.  Eyes:  Anicteric sclerae.  Pupils are equal and reactive to light.   LABORATORY STUDIES:  Previous laboratory work from 03/20/2010, his INR was 1.12, platelet count was 207.  ALT was 26, albumin 4.8, total bilirubin 0.7. His hepatitis B surface antigen was positive, and his HBV DNA was detected  but not quantifiable.  E antibody was positive.  E antigen was negative.   IMAGING STUDIES:  The last imaging of his liver on 08/14/2010 by ultrasound suggests subtle changes of cirrhosis.    assessment:  The patient is a 55 year old gentleman with a history E antibody positive hepatitis B, suppressed on telbivudine which started on 03/10/2006, putting him at week 257, or 4 years 11 months of  treatment.  Because of his evidence of cirrhosis on imaging studies, we will continue this indefinitely.  In terms of his hepatitis B virus care, he is due for his serologies today.    In terms of his cirrhosis care, he will need an ultrasound this month, and he needs an EGD to be screened  for varices, having had an EGD over 3 years ago. There is no ascites or encephalopathy to treat. He is  hepatitis A immune. He will need Pneumovax and flu shots through his primary physician.    In my discussion today with the patient, I reviewed all the issues as mentioned above.   Plan:  1. Standard HBV labs today. 2. Ultrasound this month. 3. Hepatitis A immune.  4. I suggest a referral to [facility] for EGD to screen for varices. 5. I  suggest Pneumovax if not already done, as well as annual flu vaccine, through his primary physician. 6. Return in 6 months' time with another ultrasound at that time, in approximately March 2013.            Brooke Dare, MD    ADDENDUM  ALT 23  HBV No detectable level of HBV DNA.   403 .[id number]  D:  Thu Sep 06 20:17:41 2012 ; T:  Sat Sep 08 15:56:26 2012  Job #:  [id number]

## 2011-02-26 NOTE — Telephone Encounter (Signed)
Encounter error

## 2011-03-15 ENCOUNTER — Ambulatory Visit (HOSPITAL_COMMUNITY)
Admission: RE | Admit: 2011-03-15 | Discharge: 2011-03-15 | Disposition: A | Discharge status: Home / Self Care | Payer: Medicaid Other | Source: Ambulatory Visit | Attending: Gastroenterology | Admitting: Gastroenterology

## 2011-03-15 ENCOUNTER — Other Ambulatory Visit (HOSPITAL_COMMUNITY): Payer: Medicaid Other

## 2011-03-15 DIAGNOSIS — N281 Cyst of kidney, acquired: Secondary | ICD-10-CM | POA: Insufficient documentation

## 2011-03-15 DIAGNOSIS — B181 Chronic viral hepatitis B without delta-agent: Secondary | ICD-10-CM

## 2011-05-27 ENCOUNTER — Ambulatory Visit (INDEPENDENT_AMBULATORY_CARE_PROVIDER_SITE_OTHER): Payer: Medicaid Other | Admitting: Gastroenterology

## 2011-05-27 DIAGNOSIS — B181 Chronic viral hepatitis B without delta-agent: Secondary | ICD-10-CM

## 2011-05-27 LAB — CBC WITH DIFFERENTIAL/PLATELET
Eosinophils Absolute: 0.1 10*3/uL (ref 0.0–0.7)
HCT: 42.6 % (ref 39.0–52.0)
Hemoglobin: 14.7 g/dL (ref 13.0–17.0)
Lymphs Abs: 3.1 10*3/uL (ref 0.7–4.0)
MCH: 33.6 pg (ref 26.0–34.0)
MCHC: 34.5 g/dL (ref 30.0–36.0)
MCV: 97.3 fL (ref 78.0–100.0)
Monocytes Absolute: 0.1 10*3/uL (ref 0.1–1.0)
Monocytes Relative: 1 % — ABNORMAL LOW (ref 3–12)
Neutrophils Relative %: 53 % (ref 43–77)
RBC: 4.38 MIL/uL (ref 4.22–5.81)

## 2011-05-27 LAB — HEPATIC FUNCTION PANEL
ALT: 64 U/L — ABNORMAL HIGH (ref 0–53)
Albumin: 4.3 g/dL (ref 3.5–5.2)
Total Protein: 7 g/dL (ref 6.0–8.3)

## 2011-05-28 LAB — HEPATITIS B SURFACE ANTIBODY,QUALITATIVE: Hep B S Ab: NEGATIVE

## 2011-05-28 LAB — HEPATITIS B SURFACE ANTIGEN: Hepatitis B Surface Ag: POSITIVE — AB

## 2011-06-01 LAB — HEPATITIS B E ANTIGEN: Hepatitis Be Antigen: NEGATIVE

## 2011-06-01 LAB — HEPATITIS B E ANTIBODY: Hepatitis Be Antibody: POSITIVE — AB

## 2011-06-03 NOTE — Progress Notes (Addendum)
NAMEMarland Kitchen  [name], [name]  MR#:  [id number]      DATE:  05/27/2011  DOB:  07/23/1955    cc: REFERRING PHYSICIAN:  Sigmund Hazel, MD  [facility] Medicine at [facility] [address] [place], [place] [address] Fax: [phone]   CONSULTING PHYSICIANS:  Adolph Pollack, MD  [facility]  [address] [place], [place] [address] Fax: [phone]     Reason for visit:  Follow up of E antibody positive, hepatitis B with with radiographic evidence of cirrhosis.    History:  The patient returns unaccompanied. He is on telbivudine for his E antibody positive, hepatitis B with a previous ultrasound on 06/25/2008, suggesting cirrhosis. He has no symptoms referable to his  hepatitis B nor symptoms to suggest decompensated liver disease. He has not been screened for varices, as suggested in my previous note, though he has never bled.   PAST MEDICAL HISTORY:  Significant for splenectomy on 06/14/2005, for pancytopenia. He cannot recall receiving a flu shot or pneumonia vaccine, though suggested in  my previous notes. This may be because he does not attend a primary physician for any reason.   CURRENT MEDICATIONS:  Telbivudine 600 mg daily.   ALLERGIES:  Contrast media, though he previously had an MRI without complications.   HABITS:  Smokes approximately half pack cigarettes per day. Denies interval consumption of alcohol.   REVIEW OF SYSTEMS:  All 10 systems reviewed today with the patient and they are negative other than that which was mentioned above. CES-D was 12.   PHYSICAL EXAMINATION:   Constitutional:  Well-appearing without stigmata of chronic liver disease or significant bitemporal wasting. Vital signs: Height 68 inches, weight 178 pounds, down 4 pounds from previously. Blood  pressure 117/72, pulse of 84, temperature 98 Fahrenheit.  Ears, nose, mouth and throat:  Unremarkable oropharynx.  No thyromegaly or neck masses.   Chest:  Resonant to percussion.  Clear to auscultation.   Cardiovascular:  Heart sounds normal S1, S2 without murmurs or rubs.  There is no peripheral edema.  Abdominal:  Normal bowel sounds.  No masses or tenderness.  I could not appreciate a liver edge or spleen  tip.  I could not appreciate any hernias.  Lymphatics:  No cervical or inguinal lymphadenopathy.  Central Nervous System:  No asterixis or focal neurologic findings.  Dermatologic:  Anicteric without palmar  erythema or spider angiomata.  Eyes:  Anicteric sclerae.  Pupils are equal and reactive to light.   Laboratories:  On 02/11/2011; his hepatitis B E antigen was negative. Hepatitis B E antibody was positive. His HBV DNA was undetectable. Hepatitis B surface antigen was positive. His B surface antibody was negative. His  liver enzymes showed an ALT of 23, AST of 29, ALP of 75, albumin 4.5, total bilirubin 0.4.  Last imaging of his liver was 03/15/2011, which showed changes of cirrhosis but no focal liver lesions noted.   ASSESSMENT:  The patient is a 55 year old gentleman with a history of E antibody positive, hepatitis B suppressed with telbivudine, which was started 03/10/2006 putting him at week 272 or  5 years, 2 months, and 16 days  of telbivudine. Because of his cirrhosis, my intention is to keep him on telbivudine indefinitely.  In terms of hepatitis B care, he is due for serologies today. He is also due for other imaging study by March 2013. He is hepatitis A immune. He was never referred for the EGD that I suggested  in the  past. However his previous platelet count done on 03/20/2010 was 207 so that he may not need screening for varices.  In my discussion today with the patient, we discussed his progress to date. I reviewed the previous lab work with him. We discussed obtaining a CBC today to check his platelet to see if he needs an  endoscopy to screen for varices. We discussed obtaining an ultrasound in 3 months' time,  which would be 6 months from his last 1.   Plan:  1. Standard HBV labs today. 2. Ultrasound for March 2013. 3. Hepatitis A immune. 4. Check a CBC to see if he is thrombocytopenic enough to warrant screening for varices. This would have to be done at [facility] because he was never referred locally. 5. Pneumovax and flu shot through his local physicians, if not already done. 6. He is to return in March 2013 at the same time he has ultrasound.            Brooke Dare, MD   ADDENDUM;  HBV detectable but < 20 IU/mL ALT 64  Hep B e Ab positive.  Hep B e Ag negative  Hep B s Ag positive.  Plts 291, so will hold off on EGD.  403 .[id number]  D:  Thu Dec 20 17:22:39 2012 ; T:  Thu Dec 20 23:05:43 2012  Job #:  [id number]

## 2011-08-12 ENCOUNTER — Ambulatory Visit (INDEPENDENT_AMBULATORY_CARE_PROVIDER_SITE_OTHER): Payer: Medicaid Other | Admitting: Gastroenterology

## 2011-08-12 ENCOUNTER — Ambulatory Visit (HOSPITAL_COMMUNITY)
Admission: RE | Admit: 2011-08-12 | Discharge: 2011-08-12 | Disposition: A | Discharge status: Home / Self Care | Payer: Medicaid Other | Source: Ambulatory Visit | Attending: Gastroenterology | Admitting: Gastroenterology

## 2011-08-12 DIAGNOSIS — K7689 Other specified diseases of liver: Secondary | ICD-10-CM | POA: Insufficient documentation

## 2011-08-12 DIAGNOSIS — B181 Chronic viral hepatitis B without delta-agent: Secondary | ICD-10-CM | POA: Insufficient documentation

## 2011-08-12 DIAGNOSIS — C801 Malignant (primary) neoplasm, unspecified: Secondary | ICD-10-CM

## 2011-08-12 DIAGNOSIS — C772 Secondary and unspecified malignant neoplasm of intra-abdominal lymph nodes: Secondary | ICD-10-CM

## 2011-08-12 DIAGNOSIS — K869 Disease of pancreas, unspecified: Secondary | ICD-10-CM | POA: Insufficient documentation

## 2011-08-12 DIAGNOSIS — Q619 Cystic kidney disease, unspecified: Secondary | ICD-10-CM | POA: Insufficient documentation

## 2011-08-12 DIAGNOSIS — K7460 Unspecified cirrhosis of liver: Secondary | ICD-10-CM

## 2011-08-12 DIAGNOSIS — Z856 Personal history of leukemia: Secondary | ICD-10-CM | POA: Insufficient documentation

## 2011-08-12 LAB — HEPATIC FUNCTION PANEL
AST: 30 U/L (ref 0–37)
Albumin: 4.6 g/dL (ref 3.5–5.2)
Total Bilirubin: 0.5 mg/dL (ref 0.3–1.2)

## 2011-08-12 NOTE — Progress Notes (Signed)
Eugene Kitchen [name], [name]  MR#: [id number]      DATE: 08/12/2011  DOB: May 30, 1956    cc:  REFERRING PHYSICIAN:  Sigmund Hazel, MD  [facility] Medicine at [facility] [address] [place], [place] [address] Fax: [phone]   CONSULTING PHYSICIANS:  Adolph Pollack, MD  [facility]  [address] [place], [place] [address] Fax: [phone]    REASON FOR VISIT:  Follow up of E antibody positive, hepatitis B with with radiographic evidence of cirrhosis.   HISTORY:  The patient returns unaccompanied. He is on telbivudine for his E antibody positive, hepatitis B with a previous ultrasound on 06/25/2008, suggesting cirrhosis. He has no symptoms referable to his  hepatitis B nor symptoms to suggest decompensated liver disease. He has not been screened for varices, and he has never bled.   PAST MEDICAL HISTORY:  Significant for splenectomy on 06/14/2005, for pancytopenia. He cannot recall receiving a flu shot or pneumonia vaccine, though suggested in my previous notes. This may be because he does not attend a primary physician because for most of the time I have followed him, he had no insurance.  CURRENT MEDICATIONS:  Telbivudine 600 mg daily.   ALLERGIES:  Contrast media, though he previously had an MRI without complications.   HABITS:  Smokes approximately half pack cigarettes per day. Denies interval consumption of alcohol.   REVIEW OF SYSTEMS:  All 10 systems reviewed today with the patient and they are negative other than that which was mentioned above. CES-D was 12.   PHYSICAL EXAMINATION:  Constitutional: Well-appearing without stigmata of chronic liver disease or significant bitemporal wasting. Vital signs: Height 68 inches, weight 176 pounds. Blood pressure 142/85, pulse of 76, temperature 97.9 Fahrenheit.  Ears, nose, mouth and throat: Unremarkable oropharynx. No thyromegaly or neck masses.  Chest: Resonant to percussion.  Clear to auscultation.  Cardiovascular: Heart sounds normal S1, S2 without murmurs or rubs. There is no peripheral edema.  Abdominal: Normal bowel sounds. No masses or tenderness. I could not appreciate a liver edge or spleen  tip. I could not appreciate any hernias.  Lymphatics: No cervical or inguinal lymphadenopathy.  Central Nervous System: No asterixis or focal neurologic findings. Dermatologic: Anicteric without palmar  erythema or spider angiomata.  Eyes: Anicteric sclerae. Pupils are equal and reactive to light.   LABORATORIES:  05/27/11  HBV detectable but < 20 IU/mL ALT 64 Hep B e Ab positive. Hep B e Ag negative Hep B s Ag positive. Plts 291  Ultrasound 08/12/11 (today), which showed changes of cirrhosis but no focal liver lesions noted.   There may be a peripancreatic lymph node.  ASSESSMENT:  The patient is a 56 year old gentleman with a history of E antibody positive, hepatitis B suppressed with telbivudine, which was started 03/10/2006 putting him at week 283 or 5 years, 5 months, and 3 days  of telbivudine. Because of his cirrhosis, my intention is to keep him on telbivudine indefinitely.  In terms of hepatitis B care, he is due for serologies today. His imaging is up to date and should be repeated by 8/13.  He is hepatitis A immune. He was never referred for the EGD that I suggested in the  past. However his previous platelet count done on 03/20/2010 was 207 so that he may not need screening for varices.  Although it is not clear that the ultrasound showed a peripancreatic node, the only way to tell is to get a CT or MRI.  Even if he has a lymph node, it may be consistent with viral hepatitis.  But his prior history of hairy cell leukemia in 2005 led the radiologist to suggest the CT, though I am not sure if there is a link between his leukemia history and the possibility of adenopathy 8 years later.  In my discussion today with the patient, we discussed his progress to date. I  reviewed the previous lab work with him and the ultrasound.  He wanted to have the CT done.  PLAN:  1. Standard HBV labs today. 2. Ultrasound for 01/2012. 3. Hepatitis A immune. 4. Ordered a CT with contrast as suggested by radiology. 5. Pneumovax and flu shot through his local physicians, if not already done. 6. No need for EGD as long as platelets over 100. 7. He is to return in 3 months.  Brooke Dare, MD   ADDENDUM  Hep B e AB positive.  HBV DNA  Not Detected.  ALT 22.

## 2011-08-13 LAB — HEPATITIS B SURFACE ANTIBODY,QUALITATIVE: Hep B S Ab: NEGATIVE

## 2011-08-16 LAB — HEPATITIS B E ANTIBODY: Hepatitis Be Antibody: POSITIVE — AB

## 2011-08-16 LAB — HEPATITIS B E ANTIGEN: Hepatitis Be Antigen: NEGATIVE

## 2011-09-16 ENCOUNTER — Other Ambulatory Visit: Payer: Self-pay | Admitting: Gastroenterology

## 2011-09-16 DIAGNOSIS — B181 Chronic viral hepatitis B without delta-agent: Secondary | ICD-10-CM

## 2011-09-16 MED ORDER — TELBIVUDINE 600 MG PO TABS: 600.0000 mg | ORAL_TABLET | Freq: Every day | ORAL | Status: AC

## 2011-09-17 ENCOUNTER — Ambulatory Visit (INDEPENDENT_AMBULATORY_CARE_PROVIDER_SITE_OTHER): Payer: Medicaid Other | Admitting: General Surgery

## 2011-09-30 ENCOUNTER — Ambulatory Visit: Payer: Medicaid Other | Attending: Family Medicine | Admitting: Physical Therapy

## 2011-09-30 DIAGNOSIS — M545 Low back pain, unspecified: Secondary | ICD-10-CM | POA: Insufficient documentation

## 2011-09-30 DIAGNOSIS — M6281 Muscle weakness (generalized): Secondary | ICD-10-CM | POA: Insufficient documentation

## 2011-09-30 DIAGNOSIS — IMO0001 Reserved for inherently not codable concepts without codable children: Secondary | ICD-10-CM | POA: Insufficient documentation

## 2011-10-06 ENCOUNTER — Encounter (INDEPENDENT_AMBULATORY_CARE_PROVIDER_SITE_OTHER): Payer: Self-pay | Admitting: General Surgery

## 2011-10-06 ENCOUNTER — Ambulatory Visit: Payer: Medicaid Other | Attending: Family Medicine | Admitting: Physical Therapy

## 2011-10-06 DIAGNOSIS — M6281 Muscle weakness (generalized): Secondary | ICD-10-CM | POA: Insufficient documentation

## 2011-10-06 DIAGNOSIS — M545 Low back pain, unspecified: Secondary | ICD-10-CM | POA: Insufficient documentation

## 2011-10-06 DIAGNOSIS — IMO0001 Reserved for inherently not codable concepts without codable children: Secondary | ICD-10-CM | POA: Insufficient documentation

## 2011-10-12 ENCOUNTER — Encounter (INDEPENDENT_AMBULATORY_CARE_PROVIDER_SITE_OTHER): Payer: Self-pay | Admitting: General Surgery

## 2011-10-12 ENCOUNTER — Other Ambulatory Visit (INDEPENDENT_AMBULATORY_CARE_PROVIDER_SITE_OTHER): Payer: Self-pay | Admitting: General Surgery

## 2011-10-12 ENCOUNTER — Ambulatory Visit (INDEPENDENT_AMBULATORY_CARE_PROVIDER_SITE_OTHER): Payer: Medicaid Other | Admitting: General Surgery

## 2011-10-12 ENCOUNTER — Encounter (INDEPENDENT_AMBULATORY_CARE_PROVIDER_SITE_OTHER): Payer: Self-pay

## 2011-10-12 VITALS — BP 127/85 | HR 108 | Temp 97.4°F | Ht 68.0 in | Wt 177.6 lb

## 2011-10-12 DIAGNOSIS — R1010 Upper abdominal pain, unspecified: Secondary | ICD-10-CM

## 2011-10-12 DIAGNOSIS — R1013 Epigastric pain: Secondary | ICD-10-CM

## 2011-10-12 NOTE — Progress Notes (Signed)
Patient ID: [name], male   DOB: [birth year], 56 y.o.   MRN: [id number]  Chief Complaint  Patient presents with  . Pre-op Exam    re-current vent hernia    HPI [name] is a 56 y.o. male.   HPI  He presents today complaining of intermittent RUQ pain brought on with certain movements.  The pain is sharp in nature and has no other associated symptoms.  He also feels that he may have more bulging around his ventral hernia repair site.  Past Medical History  Diagnosis Date  . Hepatitis   . Leukemia, hairy cell   . Incisional hernia     Past Surgical History  Procedure Date  . Splenectomy, total   . Hernia repair     laparoscopic ventral     History reviewed. No pertinent family history.  Social History History  Substance Use Topics  . Smoking status: Current Everyday Smoker -- 0.5 packs/day    Types: Cigarettes  . Smokeless tobacco: Not on file  . Alcohol Use: No    No Known Allergies  Current Outpatient Prescriptions  Medication Sig Dispense Refill  . telbivudine (TYZEKA) 600 MG tablet Take 1 tablet (600 mg total) by mouth daily.  30 tablet  11    Review of Systems Review of Systems  Constitutional: Negative.   Respiratory: Negative.   Cardiovascular: Negative.   Gastrointestinal: Negative for nausea, vomiting and constipation.    Blood pressure 127/85, pulse 108, temperature 97.4 F (36.3 C), temperature source Temporal, height 5\' 8"  (1.727 m), weight 177 lb 9.6 oz (80.559 kg), SpO2 98.00%.  Physical Exam Physical Exam  Constitutional: He appears well-developed and well-nourished. No distress.  Abdominal: Soft. He exhibits no distension and no mass. There is no tenderness.       Midline scar with an epigastric bulge when he sit up and a slight bulge with standing.    Data Reviewed Old Chart  Assessment    Epigastric pain with bulging.  He is s/p laparoscopic ventral hernia repair 11/11/2008.  He was noted to have some persistent bulging  in the epigastric region on a 09/17/2009 visit with abdominal wall laxity but no recurrent hernia.  No obvious recurrent hernia is appreciated today.    Plan    Check CT of abdomen for source of epigastric pain and to evaluate for a possible recurrent hernia.  Will call him with the results and discuss if any further evaluation or treatment is needed.       Jasa Dundon J 10/12/2011, 7:54 PM

## 2011-10-12 NOTE — Patient Instructions (Signed)
We will call you with your xray results.  

## 2011-10-13 LAB — COMPREHENSIVE METABOLIC PANEL
Albumin: 4.5 g/dL (ref 3.5–5.2)
BUN: 21 mg/dL (ref 6–23)
CO2: 28 mEq/L (ref 19–32)
Glucose, Bld: 84 mg/dL (ref 70–99)
Potassium: 4.3 mEq/L (ref 3.5–5.3)
Sodium: 141 mEq/L (ref 135–145)
Total Bilirubin: 0.4 mg/dL (ref 0.3–1.2)
Total Protein: 7 g/dL (ref 6.0–8.3)

## 2011-10-14 ENCOUNTER — Encounter (HOSPITAL_COMMUNITY): Payer: Self-pay

## 2011-10-14 ENCOUNTER — Other Ambulatory Visit (HOSPITAL_COMMUNITY): Payer: Medicaid Other

## 2011-10-14 ENCOUNTER — Other Ambulatory Visit (INDEPENDENT_AMBULATORY_CARE_PROVIDER_SITE_OTHER): Payer: Self-pay | Admitting: General Surgery

## 2011-10-14 ENCOUNTER — Ambulatory Visit (HOSPITAL_COMMUNITY)
Admission: RE | Admit: 2011-10-14 | Discharge: 2011-10-14 | Disposition: A | Discharge status: Home / Self Care | Payer: Medicaid Other | Source: Ambulatory Visit | Attending: General Surgery | Admitting: General Surgery

## 2011-10-14 DIAGNOSIS — Z9089 Acquired absence of other organs: Secondary | ICD-10-CM | POA: Insufficient documentation

## 2011-10-14 DIAGNOSIS — C9590 Leukemia, unspecified not having achieved remission: Secondary | ICD-10-CM | POA: Insufficient documentation

## 2011-10-14 DIAGNOSIS — N289 Disorder of kidney and ureter, unspecified: Secondary | ICD-10-CM | POA: Insufficient documentation

## 2011-10-14 DIAGNOSIS — R1010 Upper abdominal pain, unspecified: Secondary | ICD-10-CM

## 2011-10-14 DIAGNOSIS — K7460 Unspecified cirrhosis of liver: Secondary | ICD-10-CM | POA: Insufficient documentation

## 2011-10-15 ENCOUNTER — Telehealth (INDEPENDENT_AMBULATORY_CARE_PROVIDER_SITE_OTHER): Payer: Self-pay | Admitting: General Surgery

## 2011-10-18 ENCOUNTER — Telehealth (INDEPENDENT_AMBULATORY_CARE_PROVIDER_SITE_OTHER): Payer: Self-pay

## 2011-10-18 NOTE — Telephone Encounter (Signed)
Patient calling for CT results, please call [phone].

## 2011-10-19 ENCOUNTER — Encounter (INDEPENDENT_AMBULATORY_CARE_PROVIDER_SITE_OTHER): Payer: Self-pay | Admitting: General Surgery

## 2011-10-19 NOTE — Progress Notes (Signed)
Patient ID: [name], male   DOB: 07-Jun-1956, 56 y.o.   MRN: [id number] Discussed the results of the CT scan with him which I have reviewed.  Mesh and abdominal wall laxity are seen but no definite recurrent hernia.  No major changes from the CT in 2011.

## 2011-10-20 ENCOUNTER — Telehealth (INDEPENDENT_AMBULATORY_CARE_PROVIDER_SITE_OTHER): Payer: Self-pay

## 2011-10-20 NOTE — Telephone Encounter (Signed)
Called pt with CT scan results.  He had already spoken with Dr. Abbey Chatters.

## 2011-12-02 ENCOUNTER — Ambulatory Visit (INDEPENDENT_AMBULATORY_CARE_PROVIDER_SITE_OTHER): Payer: Medicaid Other | Admitting: Gastroenterology

## 2011-12-02 DIAGNOSIS — B181 Chronic viral hepatitis B without delta-agent: Secondary | ICD-10-CM

## 2011-12-02 LAB — HEPATIC FUNCTION PANEL
Bilirubin, Direct: 0.1 mg/dL (ref 0.0–0.3)
Total Bilirubin: 0.4 mg/dL (ref 0.3–1.2)

## 2011-12-02 LAB — AFP TUMOR MARKER: AFP-Tumor Marker: <1.3 ng/mL (ref 0.0–8.0)

## 2011-12-03 LAB — HEPATITIS B SURFACE ANTIGEN: Hepatitis B Surface Ag: POSITIVE — AB

## 2011-12-06 LAB — HEPATITIS B E ANTIGEN: Hepatitis Be Antigen: NEGATIVE

## 2011-12-06 LAB — HEPATITIS B DNA, ULTRAQUANTITATIVE, PCR: Hepatitis B DNA: NOT DETECTED IU/mL (ref <20)

## 2011-12-23 NOTE — Progress Notes (Signed)
[name], [name]  MR#:  [id number]      DATE:  12/02/2011  DOB:  23-Oct-1955    cc: Consulting Physician: Adolph Pollack, MD, [facility], [address], [place], [place] [address], Fax [phone]  Primary Care Physician:  Same  Referring Physician: Sigmund Hazel, MD, [facility] at [facility], [address] [place], [place] [address], Fax [phone]    REASON FOR VISIT:  Follow up of E antibody positive hepatitis B with radiographic evidence of cirrhosis.   HISTORY:  The patient returns today unaccompanied. He continues on telbivudine for his E antibody positive hepatitis B. His previous ultrasound 06/25/2008, established that he had cirrhosis. There are no symptoms referable to his history of hepatitis B. He tolerates the telbivudine well. There are no symptoms to suggest decompensated liver disease. He has never bled and he has not been screened for varices.   PAST MEDICAL HISTORY:  Significant for splenectomy and 06/14/2005 for pancytopenia.   He has recently seen Dr. Abbey Chatters regarding concern over a hernia. He had a noncontrast CT of the abdomen on 10/12/2011, that did not demonstrate hernia. Dr. Abbey Chatters discussed the results of the CT with him, and there is no hernia to repair.   CURRENT MEDICATIONS:  Telbivudine 600 mg daily.   ALLERGIES:  Contrast media, although he previously had MRIs without complications.   HABITS:  Smoking approximately half a pack of cigarettes per day. Alcohol denies interval consumption.   REVIEW OF SYSTEMS:  All 10 systems reviewed today with the patient and they are negative other than which was mentioned above. His CES-D was 12.   PHYSICAL EXAMINATION:  Constitutional: Well appearing without stigmata of chronic liver disease. Vital Signs: Height 68 inches, weight 176 pounds, unchanged  from previously, blood pressure 115/80, pulse 74, temperature 96.8 Fahrenheit. Ears,  Nose, Mouth and Throat:  Unremarkable oropharynx.  No thyromegaly or neck masses.  Chest:  Resonant to percussion.  Clear to auscultation.  Cardiovascular:  Heart sounds normal S1, S2 without murmurs or rubs.  There is no peripheral edema.  Abdomen:  Normal bowel sounds.  No masses or tenderness.  I could not appreciate a liver edge or spleen tip.  I could not appreciate any hernias.  Lymphatics:  No cervical or inguinal lymphadenopathy.  Central Nervous System:  No asterixis or focal neurologic findings.  Dermatologic:  Anicteric without palmar erythema or spider angiomata.  Eyes:  Anicteric sclerae.  Pupils are equal and reactive to light.  laboratories: Previous lab work reviewed today with the patient.   ASSESSMENT:  The patient is a 56 year old gentleman with history of E antibody positive hepatitis B with radiographic evidence to suggest cirrhosis who is sufficiently suppressed with telbivudine. He has been on telbivudine now for approximately 299 weeks or 5 years and almost 9 months having started on 03/10/2006. It is my intention, because of his cirrhosis, to keep him on this indefinitely.   In terms of his hepatitis B care, he is due for serologies today. His imaging is by ultrasound and would be due by September 2013. There are no symptoms of encephalopathy or ascites to treat. I previously referred him for an EGD in the past but this was never done. His previous platelet count have been well over 100,000, so he does not need screening for varices at this point, and he has never bled. He is hepatitis A immune.   It should be noted that at his last  appointment on 08/12/2011, we discussed the possibility of doing a CT scan with contrast to evaluate a peripancreatic node. We discussed this, I ordered the test at that time. It appears this was actually never done, but canceled most likely due to a previously reported contrast allergy. I really do not think that this was significant, so an ultrasound  in September 2013 would be sufficient for followup.   In my discussion today with the patient, we discussed his previous lab work results.   PLAN:  1. Standard HBV labs today.  2. Hepatitis A immune.  3. Order ultrasound for September 2013, 6 months from previous.  4. Flu shot and Pneumovax through his primary physician if not already done.  5. No need for endoscopy to screen for varices if platelets are over 100,000.  6. Return in 6 months' time rather than 3 because of stability of disease.               Brooke Dare, MD   ADDENDUM HBV DNA undetectable  403 .[id number]  D:  Thu Jun 27 17:53:42 2013 ; T:  Thu Jun 27 21:49:34 2013  Job #:  [id number]

## 2012-06-12 ENCOUNTER — Other Ambulatory Visit: Payer: Self-pay | Admitting: Family Medicine

## 2012-06-12 DIAGNOSIS — M545 Low back pain, unspecified: Secondary | ICD-10-CM

## 2012-06-12 DIAGNOSIS — M79604 Pain in right leg: Secondary | ICD-10-CM

## 2012-06-15 ENCOUNTER — Ambulatory Visit
Admission: RE | Admit: 2012-06-15 | Discharge: 2012-06-15 | Disposition: A | Discharge status: Home / Self Care | Payer: Medicaid Other | Source: Ambulatory Visit | Attending: Family Medicine | Admitting: Family Medicine

## 2012-06-15 DIAGNOSIS — M545 Low back pain, unspecified: Secondary | ICD-10-CM

## 2012-06-15 DIAGNOSIS — M79604 Pain in right leg: Secondary | ICD-10-CM

## 2012-07-18 ENCOUNTER — Ambulatory Visit: Payer: Medicaid Other | Admitting: Physical Therapy

## 2012-08-01 ENCOUNTER — Other Ambulatory Visit: Payer: Self-pay | Admitting: Internal Medicine

## 2012-08-01 DIAGNOSIS — K7460 Unspecified cirrhosis of liver: Secondary | ICD-10-CM

## 2012-08-18 ENCOUNTER — Other Ambulatory Visit: Payer: Medicaid Other

## 2013-01-04 ENCOUNTER — Emergency Department (HOSPITAL_COMMUNITY)
Admission: EM | Admit: 2013-01-04 | Discharge: 2013-01-04 | Discharge status: Against Medical Advice | Payer: Medicaid Other

## 2013-02-07 DIAGNOSIS — K7460 Unspecified cirrhosis of liver: Secondary | ICD-10-CM | POA: Insufficient documentation

## 2013-05-06 ENCOUNTER — Emergency Department (HOSPITAL_COMMUNITY): Payer: Medicaid Other

## 2013-05-06 ENCOUNTER — Encounter (HOSPITAL_COMMUNITY): Payer: Self-pay | Admitting: Emergency Medicine

## 2013-05-06 ENCOUNTER — Emergency Department (HOSPITAL_COMMUNITY)
Admission: EM | Admit: 2013-05-06 | Discharge: 2013-05-06 | Disposition: A | Discharge status: Home / Self Care | Payer: Medicaid Other | Attending: Emergency Medicine | Admitting: Emergency Medicine

## 2013-05-06 DIAGNOSIS — R109 Unspecified abdominal pain: Secondary | ICD-10-CM | POA: Insufficient documentation

## 2013-05-06 DIAGNOSIS — Z8719 Personal history of other diseases of the digestive system: Secondary | ICD-10-CM | POA: Insufficient documentation

## 2013-05-06 DIAGNOSIS — R51 Headache: Secondary | ICD-10-CM | POA: Insufficient documentation

## 2013-05-06 DIAGNOSIS — R52 Pain, unspecified: Secondary | ICD-10-CM | POA: Insufficient documentation

## 2013-05-06 DIAGNOSIS — J189 Pneumonia, unspecified organism: Secondary | ICD-10-CM

## 2013-05-06 DIAGNOSIS — F17200 Nicotine dependence, unspecified, uncomplicated: Secondary | ICD-10-CM | POA: Insufficient documentation

## 2013-05-06 DIAGNOSIS — Z856 Personal history of leukemia: Secondary | ICD-10-CM | POA: Insufficient documentation

## 2013-05-06 DIAGNOSIS — E871 Hypo-osmolality and hyponatremia: Secondary | ICD-10-CM | POA: Insufficient documentation

## 2013-05-06 DIAGNOSIS — J159 Unspecified bacterial pneumonia: Secondary | ICD-10-CM | POA: Insufficient documentation

## 2013-05-06 LAB — CBC WITH DIFFERENTIAL/PLATELET
Basophils Absolute: 0 10*3/uL (ref 0.0–0.1)
Basophils Relative: 0 % (ref 0–1)
Eosinophils Absolute: 0 10*3/uL (ref 0.0–0.7)
Eosinophils Relative: 0 % (ref 0–5)
HCT: 36.7 % — ABNORMAL LOW (ref 39.0–52.0)
Hemoglobin: 13.7 g/dL (ref 13.0–17.0)
Lymphocytes Relative: 6 % — ABNORMAL LOW (ref 12–46)
Lymphs Abs: 0.5 10*3/uL — ABNORMAL LOW (ref 0.7–4.0)
MCH: 33.6 pg (ref 26.0–34.0)
MCHC: 36.5 g/dL — ABNORMAL HIGH (ref 30.0–36.0)
MCV: 90 fL (ref 78.0–100.0)
Monocytes Absolute: 0.1 10*3/uL (ref 0.1–1.0)
Monocytes Relative: 1 % — ABNORMAL LOW (ref 3–12)
Neutro Abs: 8.4 10*3/uL — ABNORMAL HIGH (ref 1.7–7.7)
Neutrophils Relative %: 93 % — ABNORMAL HIGH (ref 43–77)
Platelets: 278 10*3/uL (ref 150–400)
RBC: 4.08 MIL/uL — ABNORMAL LOW (ref 4.22–5.81)
RDW: 14 % (ref 11.5–15.5)
WBC: 9 10*3/uL (ref 4.0–10.5)

## 2013-05-06 LAB — BASIC METABOLIC PANEL
BUN: 12 mg/dL (ref 6–23)
CO2: 21 mEq/L (ref 19–32)
Calcium: 8.8 mg/dL (ref 8.4–10.5)
Chloride: 87 mEq/L — ABNORMAL LOW (ref 96–112)
Creatinine, Ser: 0.6 mg/dL (ref 0.50–1.35)
GFR calc Af Amer: >90 mL/min (ref >90)
GFR calc non Af Amer: >90 mL/min (ref >90)
Glucose, Bld: 122 mg/dL — ABNORMAL HIGH (ref 70–99)
Potassium: 3.9 mEq/L (ref 3.5–5.1)
Sodium: 122 mEq/L — ABNORMAL LOW (ref 135–145)

## 2013-05-06 MED ORDER — AZITHROMYCIN 250 MG PO TABS
500.0000 mg | ORAL_TABLET | Freq: Once | ORAL | Status: AC
  Administered 2013-05-06: 500 mg via ORAL
  Filled 2013-05-06: qty 2

## 2013-05-06 MED ORDER — SODIUM CHLORIDE 0.9 % IV BOLUS (SEPSIS)
1000.0000 mL | Freq: Once | INTRAVENOUS | Status: AC
  Administered 2013-05-06: 1000 mL via INTRAVENOUS

## 2013-05-06 MED ORDER — DEXTROSE 5 % IV SOLN
1.0000 g | Freq: Once | INTRAVENOUS | Status: AC
  Administered 2013-05-06: 1 g via INTRAVENOUS
  Filled 2013-05-06: qty 10

## 2013-05-06 MED ORDER — KETOROLAC TROMETHAMINE 30 MG/ML IJ SOLN
30.0000 mg | Freq: Once | INTRAMUSCULAR | Status: AC
  Administered 2013-05-06: 30 mg via INTRAVENOUS
  Filled 2013-05-06: qty 1

## 2013-05-06 MED ORDER — AZITHROMYCIN 250 MG PO TABS: 250.0000 mg | ORAL_TABLET | Freq: Every day | ORAL | Status: DC

## 2013-05-06 MED ORDER — OXYCODONE-ACETAMINOPHEN 5-325 MG PO TABS: 1.0000 | ORAL_TABLET | ORAL | Status: DC | PRN

## 2013-05-06 NOTE — ED Notes (Signed)
Pt c/o fever, body aches, HA, abd pain onset last week.

## 2013-05-06 NOTE — ED Provider Notes (Signed)
CSN: [id number]     Arrival date & time 05/06/13  4098 History   First MD Initiated Contact with Patient 05/06/13 [phone]     Chief Complaint  Patient presents with  . Fever  . Abdominal Pain  . Generalized Body Aches   (Consider location/radiation/quality/duration/timing/severity/associated sxs/prior Treatment) HPI  57yM with multiple complaints. Since past Monday has had intermittent fever, generalized fatigue, cough, myalgias and HA and abdominal pain. Symptoms have been fairly constant since onset. Reports saw PCP Thursday and had blood work and flu testing. Reports flu negative but unsure of blood work. HAs diffuse and achy. Occasionally sharper pains in b/l temporal region. No neck stiffness. No acute visual complaints, numbness, tingling or loss of strength. No confusion per son. Mild intermittent periumbilical pain. No n/v/d. No urinary complaints. No rash. Chronic cough which he attributes to smoking but has been increased in past few days. No sputum. No SOB. No unusual leg pain or swelling. No sick contacts. Past hx of hairy cell leukemia he reports is in remission and not active tx. S/p splenectomy. Hx of Hep B on tyzeka.   Past Medical History  Diagnosis Date  . Hepatitis   . Incisional hernia   . Leukemia, hairy cell dx'd 2005   Past Surgical History  Procedure Laterality Date  . Splenectomy, total    . Hernia repair      laparoscopic ventral    No family history on file. History  Substance Use Topics  . Smoking status: Current Every Day Smoker -- 0.50 packs/day    Types: Cigarettes  . Smokeless tobacco: Not on file  . Alcohol Use: No    Review of Systems  All systems reviewed and negative, other than as noted in HPI.   Allergies  Iodinated diagnostic agents  Home Medications  No current outpatient prescriptions on file. BP 108/69  Pulse 90  Temp(Src) 100.1 F (37.8 C) (Oral)  Resp 20  Ht 5\' 8"  (1.727 m)  Wt 175 lb (79.379 kg)  BMI 26.61 kg/m2  SpO2  93% Physical Exam  Nursing note and vitals reviewed. Constitutional: He appears well-developed and well-nourished. No distress.  Laying in bed. NAD.   HENT:  Head: Normocephalic and atraumatic.  Eyes: Conjunctivae are normal. Right eye exhibits no discharge. Left eye exhibits no discharge.  Neck: Neck supple.  No nuchal rigidity  Cardiovascular: Normal rate, regular rhythm and normal heart sounds.  Exam reveals no gallop and no friction rub.   No murmur heard. Pulmonary/Chest: Effort normal. No respiratory distress.  RUL rhonchi. No increased WOB.   Abdominal: Soft. He exhibits no distension. There is no tenderness.  Musculoskeletal: He exhibits no edema and no tenderness.  Lower extremities symmetric as compared to each other. No calf tenderness. Negative Homan's. No palpable cords.   Neurological: He is alert. No cranial nerve deficit. He exhibits normal muscle tone. Coordination normal.  Skin: Skin is warm and dry. No rash noted. He is not diaphoretic.  Psychiatric: He has a normal mood and affect. His behavior is normal. Thought content normal.    ED Course  Procedures (including critical care time) Labs Review Labs Reviewed  CBC WITH DIFFERENTIAL - Abnormal; Notable for the following:    RBC 4.08 (*)    HCT 36.7 (*)    MCHC 36.5 (*)    Neutrophils Relative % 93 (*)    Lymphocytes Relative 6 (*)    Monocytes Relative 1 (*)    Neutro Abs 8.4 (*)    Lymphs  Abs 0.5 (*)    All other components within normal limits  BASIC METABOLIC PANEL - Abnormal; Notable for the following:    Sodium 122 (*)    Chloride 87 (*)    Glucose, Bld 122 (*)    All other components within normal limits  URINALYSIS, ROUTINE W REFLEX MICROSCOPIC   Imaging Review Dg Chest 2 View  05/06/2013   CLINICAL DATA:  Fever, headache  EXAM: CHEST - 2 VIEW  COMPARISON:  11/06/2008  FINDINGS: Extensive airspace consolidation in most of the right upper lobe. Linear scarring or subsegmental atelectasis  medially at the left lung base. Vascular clips in the left upper abdomen. Small right pleural effusion. Heart size normal. Regional bones unremarkable.  IMPRESSION: Right upper lobe pneumonia with small effusion   Electronically Signed   By: Oley Balm M.D.   On: 05/06/2013 07:58    EKG Interpretation   None       MDM   1. CAP (community acquired pneumonia)   2. Hyponatremia     57yM with fever. CXR with RUL pneumonia. Corresponding RUL rhonchi. Significant hyponatremia. Suspect related to SIADH from pulmonary process. Pt is alert and has a nonfocal neuro exam. No confusion per pt and son. No n/v. Is having HA, but common in febrile illness and certainly not specific symptom of hyponatremia.  Discussed admission versus outpt tx. Pt declining to be admitted. I don't think this is completely unreasonable. He is HD stable. Mild hypoxemia, but pt with smoking hx and denies SOB. No increased WOB. Renal function normal. Anticipate hyponatremia to improve with treating presumed underlying cause. Pt will need outpt FU though. Return precautions were discussed.     Raeford Razor, MD 05/06/13 [phone]

## 2013-08-28 ENCOUNTER — Other Ambulatory Visit: Payer: Self-pay | Admitting: Neurosurgery

## 2013-08-28 DIAGNOSIS — IMO0002 Reserved for concepts with insufficient information to code with codable children: Secondary | ICD-10-CM

## 2013-08-28 DIAGNOSIS — M47817 Spondylosis without myelopathy or radiculopathy, lumbosacral region: Secondary | ICD-10-CM

## 2013-09-04 ENCOUNTER — Other Ambulatory Visit: Payer: Medicaid Other

## 2013-09-26 ENCOUNTER — Other Ambulatory Visit: Payer: Self-pay | Admitting: Neurosurgery

## 2013-09-26 DIAGNOSIS — M5410 Radiculopathy, site unspecified: Secondary | ICD-10-CM

## 2013-09-26 DIAGNOSIS — M5104 Intervertebral disc disorders with myelopathy, thoracic region: Secondary | ICD-10-CM

## 2013-10-02 ENCOUNTER — Inpatient Hospital Stay: Admission: RE | Admit: 2013-10-02 | Payer: Medicaid Other | Source: Ambulatory Visit

## 2013-10-09 ENCOUNTER — Inpatient Hospital Stay: Admission: RE | Admit: 2013-10-09 | Payer: Medicaid Other | Source: Ambulatory Visit

## 2014-05-08 ENCOUNTER — Ambulatory Visit: Payer: Medicaid Other | Attending: Neurosurgery | Admitting: Physical Therapy

## 2014-05-08 DIAGNOSIS — M545 Low back pain: Secondary | ICD-10-CM | POA: Insufficient documentation

## 2014-06-06 ENCOUNTER — Other Ambulatory Visit: Payer: Self-pay | Admitting: Neurosurgery

## 2014-06-06 ENCOUNTER — Ambulatory Visit
Admission: RE | Admit: 2014-06-06 | Discharge: 2014-06-06 | Disposition: A | Discharge status: Home / Self Care | Payer: Medicaid Other | Source: Ambulatory Visit | Attending: Neurosurgery | Admitting: Neurosurgery

## 2014-06-06 DIAGNOSIS — M542 Cervicalgia: Secondary | ICD-10-CM

## 2014-08-22 ENCOUNTER — Encounter: Payer: Self-pay | Admitting: Neurology

## 2014-08-22 ENCOUNTER — Ambulatory Visit (INDEPENDENT_AMBULATORY_CARE_PROVIDER_SITE_OTHER): Payer: Medicaid Other | Admitting: Neurology

## 2014-08-22 VITALS — BP 121/78 | HR 74 | Temp 98.7°F | Resp 16 | Ht 68.0 in | Wt 170.0 lb

## 2014-08-22 DIAGNOSIS — Z720 Tobacco use: Secondary | ICD-10-CM | POA: Diagnosis not present

## 2014-08-22 DIAGNOSIS — F329 Major depressive disorder, single episode, unspecified: Secondary | ICD-10-CM

## 2014-08-22 DIAGNOSIS — R413 Other amnesia: Secondary | ICD-10-CM

## 2014-08-22 DIAGNOSIS — K769 Liver disease, unspecified: Secondary | ICD-10-CM | POA: Diagnosis not present

## 2014-08-22 DIAGNOSIS — F17200 Nicotine dependence, unspecified, uncomplicated: Secondary | ICD-10-CM

## 2014-08-22 DIAGNOSIS — F32A Depression, unspecified: Secondary | ICD-10-CM

## 2014-08-22 NOTE — Patient Instructions (Addendum)
You have complaints of memory loss: memory loss or changes in cognitive function can have many reasons and does not always mean you have dementia. Conditions that can contribute to subjective or objective memory loss include: depression, stress, poor sleep from insomnia or sleep apnea, dehydration, fluctuation in blood sugar values, thyroid or electrolyte dysfunction. Dementia can be causes by stroke, brain atherosclerosis and by Alzheimer's disease or other, more rare and sometimes hereditary causes. We will do some additional testing: a brain scan and brain wave test. We will not start medication as yet. Your memory loss is mild. We may request formal cognitive testing for further delineation of the extent of your memory loss.   Contact Dr. Mordecai Rasmussen again for possible epidural steroid injections to your lower back. It may not involve any contrast dye.   Talk to Dr. Jacelyn Grip about starting an antidepressant.   I will see you back in about 4 months.   Please drink 6-8 glasses of water, and stop smoking.   Dr. Jacelyn Grip wanted you to start vitamin B12 500 micrograms daily.

## 2014-08-22 NOTE — Progress Notes (Signed)
Subjective:    Patient ID: [name] is a 59 y.o. male.  HPI     Star Age, MD, PhD [facility] [address], [address] [place], [place] [address]  Dear Dr. Jacelyn Grip,   I saw your patient, [name], upon your kind request in my neurologic clinic today for initial consultation of his memory loss. The patient is unaccompanied by today. As you know, [name] is a 59 year old right-handed gentleman with an underlying medical history of hairy cell leukemia, status post chemotherapy in 2006, smoking, hepatitis B and hepatitis E+ state, followed by liver disease specialist, Dr. Patsy Baltimore, and history of liver cirrhosis secondary to hepatitis, degenerative spine disease, for which he is seeing a neurosurgeon in [facility], reflux disease and allergic rhinitis, who reports problems with his memory for the past 6 months. He reports issues with forgetfulness primarily. He has also noted lack of motivation and initiative and more depressed mood. He gets stressed out or irritable and just wants to go to sleep. He endorses some snoring according to his wife, but no apneas are reported. He denies any morning headaches. He denies frequent headaches or bothersome headaches. He has never had TIA-type symptoms or stroke like symptoms. He endorses depressive symptoms and denies suicidal ideations. He does not drink alcohol. He smokes one pack per day, he tries to drink enough water but averages 4-6 glasses of water per day. He has been on his hepatitis medication, Viread, for the past year or so. He drives and has not noticed any issues driving. He denies a family history of mood disorders or dementia. He currently does not work. 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 He is married and has 3 children ages 64, 79 and 25. I reviewed your office note from 08/06/2014 which you kindly included. I also reviewed blood work 10-year-old office from 07/30/2014  which showed normal CBC, with the exception of lymphocyte percentage at 52.5%, normal CMP, low normal vitamin B12 at 264 which you advised him to start vitamin B12 500 g daily, hepatitis B surface antigen positive, hepatitis B core antibody positive, hepatitis C antibody negative, RPR nonreactive, TSH normal he was recently started on Protonix for his reflux symptoms and Flonase for his allergic rhinitis.  He endorses neck pain and LBP. He saw Dr. Mordecai Rasmussen in [place] and had a recent L spine MRI. He was offered ESI, which he did not want to do. He tramadol 50 mg tid for back pain. The possibility of low back surgery was also discussed with him as I understand.  His Past Medical History Is Significant For: Past Medical History  Diagnosis Date  . Hepatitis   . Incisional hernia   . Leukemia, hairy cell dx'd 2005  . Memory impairment   . Hepatitis B infection   . Hairy cell leukemia 2005     Last chemo 2006  . Myopathy   . Cervical radiculopathy   . Lumbar radiculopathy   . GERD (gastroesophageal reflux disease)   . Lumbar radiculopathy     His Past Surgical History Is Significant For: Past Surgical History  Procedure Laterality Date  . Splenectomy, total    . Hernia repair      laparoscopic ventral     His Family History Is Significant For: Family History  Problem Relation Age of Onset  . Liver cancer Mother   . Diabetes Father     His Social History Is Significant For:  History   Social History  . Marital Status: Married    Spouse Name: N/A  . Number of Children: 3  . Years of Education: N/A   Occupational History  . Unemployed    Social History Main Topics  . Smoking status: Current Every Day Smoker -- 0.50 packs/day    Types: Cigarettes  . Smokeless tobacco: Not on file  . Alcohol Use: No  . Drug Use: No  . Sexual Activity: Not on file   Other Topics Concern  . None   Social History Narrative   Some caffeine use     His Allergies Are:  Allergies  Allergen  Reactions  . Iodinated Diagnostic Agents Swelling    Itching and swelling with pre meds in 2005; non ionic cm  :   His Current Medications Are:  Outpatient Encounter Prescriptions as of 08/22/2014  Medication Sig  . traMADol (ULTRAM) 50 MG tablet Take 100 mg by mouth every 6 (six) hours as needed for moderate pain.  Marland Kitchen VIREAD 300 MG tablet   :  Review of Systems:  Out of a complete 14 point review of systems, all are reviewed and negative with the exception of these symptoms as listed below:   Review of Systems  Neurological:       Memory loss    Objective:  Neurologic Exam  Physical Exam Physical Examination:   Filed Vitals:   08/22/14 1405  BP: 121/78  Pulse: 74  Temp: 98.7 F (37.1 C)  Resp: 16    General Examination: The patient is a very pleasant 59 y.o. male in no acute distress. He is calm and cooperative with the exam. He denies Auditory Hallucinations and Visual Hallucinations. He is very well groomed and situated in a chair.   HEENT: Normocephalic, atraumatic, pupils are equal, round and reactive to light and accommodation. Funduscopic exam is normal with sharp disc margins noted. Extraocular tracking shows no saccadic breakdown without nystagmus noted. Hearing is intact. Face is symmetric with no facial masking and normal facial sensation. There is no lip, neck or jaw tremor. Neck is not rigid with intact passive ROM. There are no carotid bruits on auscultation. Oropharynx exam reveals mild mouth dryness. No significant airway crowding is noted. Mallampati is class II. Tongue protrudes centrally and palate elevates symmetrically.    Chest: is clear to auscultation without wheezing, rhonchi or crackles noted.  Heart: sounds are regular and normal without murmurs, rubs or gallops noted.   Abdomen: is soft, non-tender and non-distended with normal bowel sounds appreciated on auscultation.  Extremities: There is no pitting edema in the distal lower extremities  bilaterally. Pedal pulses are intact.   Skin: is warm and dry with no trophic changes noted. Age-related changes are noted on the skin.   Musculoskeletal: exam reveals no obvious joint deformities, tenderness or joint swelling or erythema.   Neurologically:  Mental status: The patient is awake and alert, paying good  attention. He is able to completely provide the history. He is oriented to: person, place, time/date, situation, day of week, month of year and year. His memory, attention, language and knowledge are impaired mildly, and there is a component of mild psychomotor retardation. There is no aphasia, agnosia, apraxia or anomia. Speech is mildly soft. Mood is mildly depressed appearing and affect is flat.   On 08/22/2014: His MMSE (Mini-Mental state exam) score is 26/30.  CDT (Clock Drawing Test) score is 4/4.  AFT (Animal Fluency Test) score is 9.    Cranial  nerves are as described above under HEENT exam. In addition, shoulder shrug is normal with equal shoulder height noted.  Motor exam: Normal bulk, and strength for age is noted. Tone is not rigid with absence of cogwheeling. There is no drift or rebound. There is no tremor. Romberg is negative. Reflexes are 2+ in the upper extremities and 2+ in the lower extremities. Toes are downgoing bilaterally. Fine motor skills: Finger taps, hand movements, and rapid alternating patting are not impaired bilaterally. Foot taps and foot agility are not impaired bilaterally.   Cerebellar testing shows no dysmetria or intention tremor on finger to nose testing. Heel to shin is unremarkable. There is no truncal or gait ataxia.   Sensory exam is intact to light touch, pinprick, vibration, temperature sense on the left side and he does have decrease in pinprick and temperature sensation in the inner aspect of his right distal leg, below the knee and in his right forearm, diffusely, normal on the left upper extremity.   Gait, station and balance: He stands  up from the seated position with no difficulty. No veering to one side is noted. No leaning to one side. Posture is not stooped. Stance is narrow-based. He turns en bloc. Tandem walk is possible. Balance is not impaired.    Assessment and Plan:   In summary, SHANAN MCMILLER is a very pleasant 59 y.o.-year old male with an underlying medical history of hairy cell leukemia, status post chemotherapy in 2006, smoking, hepatitis B and hepatitis E+ state, followed by liver disease specialist, Dr. 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 On examination he has a largely nonfocal neurological exam which is reassuring. He has mild decrease in sensation in his leg which could be from his degenerative spine disease. He does report having had a recent lumbar spine MRI but the results are not available for my review today. His memory score is mildly abnormal with a MMSE of 26 out of 30 and processing speed is decreased for age. In addition, he endorses depressive symptoms and does appear to be mildly depressed. There is mild evidence of psychomotor retardation.  his memory complaints are in the realm of mild cognitive impairment, nevertheless, his medical history plays a role as well as possible medication effects, and untreated depression or mood disorder will also play a role. Today, I had a long discussion with the patient regarding his complaints and my physical exam results and memory score results. At this juncture, I would like to proceed with further testing in the form of brain MRI as well as EEG, given his history of liver disease. We will call him with his test results. I would like to hold off on any new medications at this time. He is advised to drink more water, and stop smoking. Furthermore, he is advised to  discuss with you potential treatment of his depression. He has a history of back problems and back pain, and epidural steroid injections were discussed with him by his back doctor but the patient decided not to proceed with those. He is encouraged to reconsider and talk to his back specialist about treatment options of his back pain. He has been taking tramadol 3 times a day currently. I would like to see him back in about 4 months, sooner if the need arises and we  will keep them posted regarding his test results. I answered all his questions today and the patient was in agreement with the above outlined plan.   Thank you very much for allowing me to participate in the care of this nice patient. If I can be of any further assistance to you please do not hesitate to call me at [phone].  Sincerely,   Star Age, MD, PhD

## 2014-08-26 ENCOUNTER — Other Ambulatory Visit: Payer: Medicaid Other

## 2014-08-27 ENCOUNTER — Encounter: Payer: Self-pay | Admitting: Neurology

## 2014-09-14 ENCOUNTER — Inpatient Hospital Stay: Admission: RE | Admit: 2014-09-14 | Payer: Medicaid Other | Source: Ambulatory Visit

## 2015-01-20 DIAGNOSIS — Z0271 Encounter for disability determination: Secondary | ICD-10-CM

## 2015-10-06 ENCOUNTER — Encounter: Payer: Self-pay | Admitting: Physical Medicine & Rehabilitation

## 2015-10-06 ENCOUNTER — Encounter: Payer: Medicaid Other | Attending: Physical Medicine & Rehabilitation

## 2015-10-06 ENCOUNTER — Ambulatory Visit (HOSPITAL_BASED_OUTPATIENT_CLINIC_OR_DEPARTMENT_OTHER): Payer: Medicaid Other | Admitting: Physical Medicine & Rehabilitation

## 2015-10-06 VITALS — BP 114/73 | HR 84

## 2015-10-06 DIAGNOSIS — M5412 Radiculopathy, cervical region: Secondary | ICD-10-CM | POA: Diagnosis not present

## 2015-10-06 DIAGNOSIS — K219 Gastro-esophageal reflux disease without esophagitis: Secondary | ICD-10-CM | POA: Insufficient documentation

## 2015-10-06 DIAGNOSIS — B1910 Unspecified viral hepatitis B without hepatic coma: Secondary | ICD-10-CM | POA: Insufficient documentation

## 2015-10-06 DIAGNOSIS — M5416 Radiculopathy, lumbar region: Secondary | ICD-10-CM | POA: Insufficient documentation

## 2015-10-06 DIAGNOSIS — M4806 Spinal stenosis, lumbar region: Secondary | ICD-10-CM | POA: Diagnosis not present

## 2015-10-06 DIAGNOSIS — M48061 Spinal stenosis, lumbar region without neurogenic claudication: Secondary | ICD-10-CM

## 2015-10-06 DIAGNOSIS — Z856 Personal history of leukemia: Secondary | ICD-10-CM | POA: Insufficient documentation

## 2015-10-06 DIAGNOSIS — M5136 Other intervertebral disc degeneration, lumbar region: Secondary | ICD-10-CM | POA: Diagnosis not present

## 2015-10-06 DIAGNOSIS — G729 Myopathy, unspecified: Secondary | ICD-10-CM | POA: Insufficient documentation

## 2015-10-06 MED ORDER — GABAPENTIN 300 MG PO CAPS: 300.0000 mg | ORAL_CAPSULE | Freq: Every day | ORAL | Status: DC

## 2015-10-06 MED ORDER — TRAMADOL HCL 50 MG PO TABS: 100.0000 mg | ORAL_TABLET | Freq: Three times a day (TID) | ORAL | Status: DC | PRN

## 2015-10-06 NOTE — Patient Instructions (Addendum)
Will restart tramadol 100 mg 3 times a day Will restart gabapentin 300 g at night  Please call me if this is not helping and we will schedule for a right L4-L5 translaminar epidural steroid injection under x-ray guidance, Without contrast

## 2015-10-06 NOTE — Progress Notes (Signed)
Subjective:    Patient ID: [name], male    DOB: 10-Jul-1955, 60 y.o.   MRN: [id number]  HPI Chief complaint is back pain radiating to the right lower extremity. 60 year old male with a several year history of low back pain that radiates into the right lower extremity. He has seen Dr. Luiz Ochoa from neurosurgery in 2014,Ordered lumbar MRI 06/15/2012 with results listed below. Recommendations were a trial of physical therapy. He completed the therapy without much Beneficial result. Additional recommendations included epidural steroid injection as well as potential surgery if conservative care was not successful.  Dr. Luiz Ochoa relocated and the patient saw another neurosurgeon Dr. Mordecai Rasmussen in [facility] who also evaluated patient and had similar recommendations. The patient did not want to undergo therapy again after initial, in addition he was concerned about having contrast as part of a spinal injection. The patient is independent with all his self-care and mobility. He has not worked because of his pain. 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 Pain Inventory Average Pain 9 Pain Right Now 7 My pain is sharp, burning, stabbing and aching  In the last 24 hours, has pain interfered with the following? General activity 6 Relation with others 8 Enjoyment of life 8 What TIME of day is your pain at its worst? all Sleep (in general) NA  Pain is worse with: walking, bending, sitting and standing Pain improves with: rest and medication Relief from Meds: 6  Mobility walk without assistance walk with assistance use a cane how many minutes can you walk? 10 ability to climb steps?  yes do you drive?  yes  Function employed # of hrs/week .  Neuro/Psych weakness numbness  Prior Studies Any changes since last visit?  no RADIOLOGY REPORT*  Clinical Data: Low back pain radiating into the right leg for 6 months. No known injury or prior relevant surgery. History  of leukemia.  MRI LUMBAR SPINE WITHOUT CONTRAST  Technique: Multiplanar and multiecho pulse sequences of the lumbar spine were obtained without intravenous contrast.  Comparison: Lumbar spine radiographs 05/23/2012. Abdominal pelvic CT 10/14/2011 and abdominal MRI 06/10/2009.  Findings: The lumbar alignment is normal. There is no evidence of acute fracture or pars defect. There are scattered chronic Schmorl's nodes. In addition, nonspecific T2 hyperintense lesions are present posteriorly in the L1 and L2 vertebral bodies, unchanged from the prior abdominal MRI. There are no suspicious osseous findings.  The conus medullaris extends to the T12 level and appears normal. Mildly prominent retroperitoneal lymph nodes are unchanged. A right renal cyst is partially imaged.  There are no significant disc space findings at L1-L2 or L2-L3.  L3-L4: Mild disc bulging and mild facet hypertrophy. No spinal stenosis or nerve root encroachment. This disc is partially calcified on CT.  L4-L5: There is disc bulging with a shallow right paracentral disc protrusion. This narrows the right lateral recess and may contribute to right L5 nerve root encroachment. There is no foraminal stenosis or L4 nerve root encroachment. The facet joints are mildly hypertrophied bilaterally.  L5-S1: Mild disc bulging and bilateral facet hypertrophy. No spinal stenosis or nerve root encroachment.  IMPRESSION:  1. Shallow right paracentral disc protrusion at L4-L5 narrows the right lateral recess and may contribute to right L5 nerve root encroachment. 2. No other significant spinal stenosis. The foramina at all levels appear sufficiently patent. 3. No evidence of leukemia within the lumbar spine. Mildly prominent retroperitoneal lymph nodes are unchanged from prior CT.  Original Report Authenticated By: Richardean Sale, M.D. Physicians involved in your care Primary care  Yaakov Guthrie   Family History  Problem Relation Age of Onset  . Liver cancer Mother   . Diabetes Father    Social History   Social History  . Marital Status: Married    Spouse Name: N/A  . Number of Children: 3  . Years of Education: N/A   Occupational History  . Unemployed    Social History Main Topics  . Smoking status: Current Every Day Smoker -- 0.50 packs/day    Types: Cigarettes  . Smokeless tobacco: None  . Alcohol Use: No  . Drug Use: No  . Sexual Activity: Not Asked   Other Topics Concern  . None   Social History Narrative   Some caffeine use    Past Surgical History  Procedure Laterality Date  . Splenectomy, total    . Hernia repair      laparoscopic ventral    Past Medical History  Diagnosis Date  . Hepatitis   . Incisional hernia   . Leukemia, hairy cell ([place]) dx'd 2005  . Memory impairment   . Hepatitis B infection   . Hairy cell leukemia ([place]) 2005     Last chemo 2006  . Myopathy   . Cervical radiculopathy   . Lumbar radiculopathy   . GERD (gastroesophageal reflux disease)   . Lumbar radiculopathy    BP 114/73 mmHg  Pulse 84  SpO2 98%  Opioid Risk Score:   Fall Risk Score:  `1  Depression screen PHQ 2/9  Depression screen PHQ 2/9 10/06/2015  Decreased Interest 3  Down, Depressed, Hopeless 0  PHQ - 2 Score 3  Altered sleeping 0  Tired, decreased energy 1  Change in appetite 1  Feeling bad or failure about yourself  1  Trouble concentrating 1  Moving slowly or fidgety/restless 0  Suicidal thoughts 0  PHQ-9 Score 7     Review of Systems  All other systems reviewed and are negative.      Objective:   Physical Exam  Constitutional: He is oriented to person, place, and time. He appears well-developed and well-nourished.  HENT:  Head: Normocephalic and atraumatic.  Eyes: Conjunctivae and EOM are normal. Pupils are equal, round, and reactive to light.  Neck: Normal range of motion.  Neurological: He is alert and oriented to  person, place, and time. He has normal strength. He exhibits normal muscle tone.  Reflex Scores:      Tricep reflexes are 2+ on the right side and 2+ on the left side.      Bicep reflexes are 2+ on the right side and 2+ on the left side.      Brachioradialis reflexes are 2+ on the right side and 2+ on the left side.      Patellar reflexes are 2+ on the right side and 2+ on the left side.      Achilles reflexes are 2+ on the right side and 2+ on the left side. Normal strength bilateral deltoid, biceps, triceps, grip, hip flexor, knee extensor, ankle dorsiflexor and plantar flexion  Psychiatric: He has a normal mood and affect.  Nursing note and vitals reviewed.   Reduced pinprick sensation right C6 dermatome Reduced sensation to pinprick right L4 L5 S1      Assessment & Plan:  1. Lumbar degenerative disc with chronic right L4 and right L5 radiculopathies. In the past he has been treated with tramadol 100 mg 3 times  a day which was helpful for his pain complaints. At night he took gabapentin 300 mg. I think it is appropriate for him to resume these doses. 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 We discussed that if he does not have adequate relief with the medications, the next step would be to undergo right L4/5 translaminar lumbar epidural injection. He reports an allergy to Iodinated contrast,Some notes question whether he he had some itching with non-iodinated contrast. He did have a CT chest and abdomen in 2005 with contrast with no reported adverse effect. We discussed that we can either prophylax him if we are to use some contrast, if we do translaminar injection we can do under fluoroscopy guided without contrast. Loss of resistance technique  We'll schedule follow-up in 6 months, if he wishes to proceed with injection we can see him back sooner He has  tried physical therapy in 2 occasions did not feel any benefit does not wish to  try this again. He has tried chiropractic. He would potentially consider acupuncture but he does not have any insurance coverage for this.

## 2015-10-08 ENCOUNTER — Ambulatory Visit: Payer: Medicaid Other | Admitting: Physical Medicine & Rehabilitation

## 2015-12-08 ENCOUNTER — Telehealth: Payer: Self-pay | Admitting: Cardiology

## 2015-12-08 NOTE — Telephone Encounter (Signed)
Received records from [place] for appointment on 01/14/16 with Dr Percival Spanish.  Records given to [facility] (medical records) for Dr Hochrein's schedule on 01/14/16. lp

## 2016-01-12 NOTE — Progress Notes (Addendum)
Cardiology Office Note   Date:  01/14/2016   ID:  [name], DOB 1955-10-07, MRN [id number]  PCP:  Ileene Patrick, MD  Cardiologist:   Minus Breeding, MD   Chief Complaint  Patient presents with  . Shortness of Breath      History of Present Illness: [name] is a 60 y.o. male who presents for evaluation of dyspnea. He's had no past cardiac history though he has a family history of first-degree relatives with heart disease. He's been noticing for the last couple of months that he's been getting increasing shortness of breath with activities such as trying to push a lawnmower. He can only do this for a few minutes and then he has to stop. It takes him about 10 minutes to recover his breath. The symptoms will happen again when he starts working. He has more generalized fatigue. He has more leg tiredness. He has some rare episodes of dizziness but is not describing any palpitations, presyncope or syncope. She's not been having any visual or speech or motor disturbances. She's not describing any orthostatic symptoms. He does have neuropathy.   Past Medical History:  Diagnosis Date  . Cervical radiculopathy   . GERD (gastroesophageal reflux disease)   . Hairy cell leukemia ([place]) 2005    Last chemo 2006  . Hepatitis B infection   . Incisional hernia   . Lumbar radiculopathy   . Memory impairment   . Myopathy     Past Surgical History:  Procedure Laterality Date  . HERNIA REPAIR     laparoscopic ventral   . SPLENECTOMY, TOTAL       Current Outpatient Prescriptions  Medication Sig Dispense Refill  . traMADol (ULTRAM) 50 MG tablet Take 2 tablets (100 mg total) by mouth every 8 (eight) hours as needed. 180 tablet 5  . VIREAD 300 MG tablet   11   No current facility-administered medications for this visit.     Allergies:   Iodinated diagnostic agents    Social History:  The patient  reports that he has been smoking Cigarettes.  He has been smoking about  0.50 packs per day. He does not have any smokeless tobacco history on file. He reports that he does not drink alcohol or use drugs.   Family History:  The patient's family history includes Diabetes in his father; Heart attack (age of onset: 41) in his brother and sister; Liver cancer in his brother and mother.    ROS:  Please see the history of present illness.   Otherwise, review of systems are positive for none.   All other systems are reviewed and negative.    PHYSICAL EXAM: VS:  BP 112/78   Pulse 67   Ht 5\' 8"  (1.727 m)   Wt 180 lb 12.8 oz (82 kg)   BMI 27.49 kg/m  , BMI Body mass index is 27.49 kg/m. GENERAL:  Well appearing HEENT:  Pupils equal round and reactive, fundi not visualized, oral mucosa unremarkable, dentures NECK:  No jugular venous distention, waveform within normal limits, carotid upstroke brisk and symmetric, no bruits, no thyromegaly LYMPHATICS:  No cervical, inguinal adenopathy LUNGS:  Clear to auscultation bilaterally BACK:  No CVA tenderness CHEST:  Unremarkable HEART:  PMI not displaced or sustained,S1 and S2 within normal limits, no S3, no S4, no clicks, no rubs, no murmurs ABD:  Flat, positive bowel sounds normal in frequency in pitch, no bruits, no rebound, no guarding, no midline pulsatile mass, no hepatomegaly,  no splenomegaly EXT:  2 plus pulses throughout, no edema, no cyanosis no clubbing SKIN:  No rashes no nodules NEURO:  Cranial nerves II through XII grossly intact, motor grossly intact throughout PSYCH:  Cognitively intact, oriented to person place and time    EKG:  EKG is not ordered today. The ekg ordered 6/12/17demonstrates sinus rhythm, rate 66, axis within normal limits, intervals within normal limits, no acute ST-T wave changes.   Recent Labs: No results found for requested labs within last 8760 hours.    Lipid Panel No results found for: CHOL, TRIG, HDL, CHOLHDL, VLDL, LDLCALC, LDLDIRECT    Wt Readings from Last 3 Encounters:    01/14/16 180 lb 12.8 oz (82 kg)  08/22/14 170 lb (77.1 kg)  05/06/13 175 lb (79.4 kg)      Other studies Reviewed: Additional studies/ records that were reviewed today include: Office records, labs. Review of the above records demonstrates:  Please see elsewhere in the note.     ASSESSMENT AND PLAN:  SOB:  This could be an anginal equivalent. He has risk factors. He needs stress testing. I don't think he be able to walk treadmill. Therefore, he will have a [employer].  If this is normal I will send him back to primary provider to see if any of his medications might be contributing to the symptoms. If there is no etiology identified at that point I would be happy to see him back to reevaluate.  FATIGUE:  I reviewed recent labs.  He is not anemic and has had a normal TSH.  This will be evaluated as above.    TOBACCO:  He did not tolerate Chantix.  We talked about patches and gum.    Current medicines are reviewed at length with the patient today.  The patient does not have concerns regarding medicines.  The following changes have been made:  no change  Labs/ tests ordered today include:   Orders Placed This Encounter  Procedures  . Myocardial Perfusion Imaging     Disposition:   FU with me as above.     Signed, Minus Breeding, MD  01/14/2016 12:54 PM    [facility]

## 2016-01-14 ENCOUNTER — Encounter: Payer: Self-pay | Admitting: Cardiology

## 2016-01-14 ENCOUNTER — Ambulatory Visit (INDEPENDENT_AMBULATORY_CARE_PROVIDER_SITE_OTHER): Payer: Medicaid Other | Admitting: Cardiology

## 2016-01-14 ENCOUNTER — Telehealth (HOSPITAL_COMMUNITY): Payer: Self-pay

## 2016-01-14 VITALS — BP 112/78 | HR 67 | Ht 68.0 in | Wt 180.8 lb

## 2016-01-14 DIAGNOSIS — R0602 Shortness of breath: Secondary | ICD-10-CM

## 2016-01-14 NOTE — Patient Instructions (Signed)
Medication Instructions:  Continue current medications  Labwork: NONE  Testing/Procedures: Your physician has requested that you have a lexiscan myoview. For further information please visit [email or web address]. Please follow instruction sheet, as given.   Follow-Up: Your physician recommends that you schedule a follow-up appointment in: As Needed   Any Other Special Instructions Will Be Listed Below (If Applicable).   If you need a refill on your cardiac medications before your next appointment, please call your pharmacy.

## 2016-01-14 NOTE — Telephone Encounter (Signed)
Encounter complete. 

## 2016-01-16 ENCOUNTER — Inpatient Hospital Stay (HOSPITAL_COMMUNITY): Admission: RE | Admit: 2016-01-16 | Payer: Medicaid Other | Source: Ambulatory Visit

## 2016-01-26 ENCOUNTER — Telehealth: Payer: Self-pay | Admitting: *Deleted

## 2016-01-26 DIAGNOSIS — R0602 Shortness of breath: Secondary | ICD-10-CM

## 2016-01-26 NOTE — Telephone Encounter (Signed)
-----   Message from Minus Breeding, MD sent at 01/25/2016  9:22 AM EDT ----- Regarding: FW: Myoview order   He needs a dobutamine echo since he refuses a Lexiscan Myoview.  ----- Message ----- From: Verdene Rio Sent: 01/21/2016   2:13 PM To: Minus Breeding, MD Subject: Myoview order                                  Hello Dr. Percival Spanish,  I called [name] and spoke with him to schedule his myoview. He voiced that he does not like that test and is willing to try another test. Do you have any alternative recommendations for further treatment for this patient. I look forward to hearing from you.   Mikal Plane.

## 2016-02-02 ENCOUNTER — Telehealth (HOSPITAL_COMMUNITY): Payer: Self-pay | Admitting: *Deleted

## 2016-02-02 NOTE — Telephone Encounter (Signed)
Patient given detailed instructions per Stress Test Requisition Sheet for test on 02/04/16 at 2:30.Patient Notified to arrive 30 minutes early, and that it is imperative to arrive on time for appointment to keep from having the test rescheduled.  Patient verbalized understanding. [name]

## 2016-02-04 ENCOUNTER — Other Ambulatory Visit (HOSPITAL_COMMUNITY): Payer: Medicaid Other

## 2016-02-16 ENCOUNTER — Telehealth: Payer: Self-pay | Admitting: *Deleted

## 2016-02-16 ENCOUNTER — Telehealth: Payer: Self-pay | Admitting: Physical Medicine & Rehabilitation

## 2016-02-16 DIAGNOSIS — R0602 Shortness of breath: Secondary | ICD-10-CM

## 2016-02-16 NOTE — Telephone Encounter (Signed)
Patient needs a refill on Tramadol. 

## 2016-02-16 NOTE — Telephone Encounter (Signed)
-----   Message from Minus Breeding, MD sent at 02/14/2016  7:39 PM EDT ----- Regarding: FW: Cancelled Dobutamine Echo Please call the patient to discuss.  Please document the conversation as a telephone note.  He does not want to have stress testing if something is going to be injected into his body..  However, he could not walk on a treadmill so he has to have some kind of study with an injection.  I have suggested a dobutamine echo.  If he would like to come in further to talk about this please put on an APP schedule.   Please let me know of the outcome.   ----- Message ----- From: Verdene Rio Sent: 02/04/2016   1:32 PM To: Minus Breeding, MD, Vennie Homans Subject: Cancelled Dobutamine Echo                      [name],  [name] called in today requesting more information on the test that he was going to be having today.When I told him that the dobutamine was a medication that was going to be administered to him through IV , he refused to have the test done. He says he will not have anything injected into his body. Inform Dr. Percival Spanish of this and see if there is another test that he can have. Thanks  [employer]

## 2016-02-16 NOTE — Telephone Encounter (Signed)
GXT ordered for pt to get done  Message send to scheduler to be schedule

## 2016-02-16 NOTE — Telephone Encounter (Signed)
I spoke with his wife and they have been doing remodeling in the home and he has misplaced his tramadol (filled 01/26/16) They are requesting an early refill this onece. I have called [facility] and given them permission to fill this time and he will have one refill left to get to his 04/08/16 appt with Dr Letta Pate

## 2016-02-20 ENCOUNTER — Telehealth (HOSPITAL_COMMUNITY): Payer: Self-pay

## 2016-02-20 NOTE — Telephone Encounter (Signed)
Encounter complete. 

## 2016-02-25 ENCOUNTER — Inpatient Hospital Stay (HOSPITAL_COMMUNITY): Admission: RE | Admit: 2016-02-25 | Payer: Medicaid Other | Source: Ambulatory Visit

## 2016-03-12 ENCOUNTER — Encounter (HOSPITAL_COMMUNITY): Payer: Self-pay

## 2016-03-12 ENCOUNTER — Emergency Department (HOSPITAL_COMMUNITY)
Admission: EM | Admit: 2016-03-12 | Discharge: 2016-03-12 | Disposition: A | Discharge status: Home / Self Care | Payer: Medicaid Other | Attending: Emergency Medicine | Admitting: Emergency Medicine

## 2016-03-12 DIAGNOSIS — R740 Nonspecific elevation of levels of transaminase and lactic acid dehydrogenase [LDH]: Secondary | ICD-10-CM | POA: Insufficient documentation

## 2016-03-12 DIAGNOSIS — J181 Lobar pneumonia, unspecified organism: Secondary | ICD-10-CM | POA: Diagnosis not present

## 2016-03-12 DIAGNOSIS — F17210 Nicotine dependence, cigarettes, uncomplicated: Secondary | ICD-10-CM | POA: Insufficient documentation

## 2016-03-12 DIAGNOSIS — Z79899 Other long term (current) drug therapy: Secondary | ICD-10-CM | POA: Diagnosis not present

## 2016-03-12 DIAGNOSIS — Z856 Personal history of leukemia: Secondary | ICD-10-CM | POA: Insufficient documentation

## 2016-03-12 DIAGNOSIS — R5383 Other fatigue: Secondary | ICD-10-CM | POA: Diagnosis present

## 2016-03-12 DIAGNOSIS — R7401 Elevation of levels of liver transaminase levels: Secondary | ICD-10-CM

## 2016-03-12 LAB — BASIC METABOLIC PANEL
Anion gap: 5 (ref 5–15)
BUN: 20 mg/dL (ref 6–20)
CO2: 25 mmol/L (ref 22–32)
Calcium: 8.8 mg/dL — ABNORMAL LOW (ref 8.9–10.3)
Chloride: 106 mmol/L (ref 101–111)
Creatinine, Ser: 0.64 mg/dL (ref 0.61–1.24)
GFR calc Af Amer: >60 mL/min (ref >60)
GFR calc non Af Amer: >60 mL/min (ref >60)
GLUCOSE: 110 mg/dL — AB (ref 65–99)
POTASSIUM: 3.8 mmol/L (ref 3.5–5.1)
Sodium: 136 mmol/L (ref 135–145)

## 2016-03-12 LAB — BRAIN NATRIURETIC PEPTIDE: B Natriuretic Peptide: 56.8 pg/mL (ref 0.0–100.0)

## 2016-03-12 LAB — CBC
HEMATOCRIT: 37.1 % — AB (ref 39.0–52.0)
Hemoglobin: 13.4 g/dL (ref 13.0–17.0)
MCH: 33.8 pg (ref 26.0–34.0)
MCHC: 36.1 g/dL — AB (ref 30.0–36.0)
MCV: 93.5 fL (ref 78.0–100.0)
Platelets: 325 10*3/uL (ref 150–400)
RBC: 3.97 MIL/uL — ABNORMAL LOW (ref 4.22–5.81)
RDW: 21.2 % — AB (ref 11.5–15.5)
WBC: 6.5 10*3/uL (ref 4.0–10.5)

## 2016-03-12 LAB — URINALYSIS, ROUTINE W REFLEX MICROSCOPIC
GLUCOSE, UA: NEGATIVE mg/dL
Hgb urine dipstick: NEGATIVE
KETONES UR: NEGATIVE mg/dL
Leukocytes, UA: NEGATIVE
Nitrite: NEGATIVE
PH: 5.5 (ref 5.0–8.0)
Protein, ur: NEGATIVE mg/dL
Specific Gravity, Urine: 1.025 (ref 1.005–1.030)

## 2016-03-12 LAB — HEPATIC FUNCTION PANEL
ALK PHOS: 284 U/L — AB (ref 38–126)
ALT: 444 U/L — ABNORMAL HIGH (ref 17–63)
AST: 533 U/L — AB (ref 15–41)
Albumin: 2.8 g/dL — ABNORMAL LOW (ref 3.5–5.0)
BILIRUBIN DIRECT: 6.5 mg/dL — AB (ref 0.1–0.5)
BILIRUBIN TOTAL: 12.1 mg/dL — AB (ref 0.3–1.2)
Indirect Bilirubin: 5.6 mg/dL — ABNORMAL HIGH (ref 0.3–0.9)
Total Protein: 7.6 g/dL (ref 6.5–8.1)

## 2016-03-12 NOTE — ED Provider Notes (Signed)
[facility] DEPT Provider Note   CSN: [id number] Arrival date & time: 03/12/16  1229     History   Chief Complaint Chief Complaint  Patient presents with  . Fatigue    HPI SAI LICURSI is a 60 y.o. male.  HPI CC: fatigue  Onset/Duration: 1 month Timing: constant Location: generalized Quality: fatigue Severity: moderate Modifying Factors:  Improved by: nothing  Worsened by: activity Associated Signs/Symptoms:  Pertinent (+): jaundice, scleral icterus, abd distension which is improving while on Viread  Pertinent (-): fevers, chills, chest pain, sob, N/V, abd pain Context: pt w/ h/o leukemia in remission, but required transfusions resulting in Hep B infection. Pt is on Viread which was started approx 5-7 weeks ago. States that he missed approx 3-4 weeks of the medicine and started taking it again 2-3 weeks ago  Past Medical History:  Diagnosis Date  . Cervical radiculopathy   . GERD (gastroesophageal reflux disease)   . Hairy cell leukemia ([place]) 2005    Last chemo 2006  . Hepatitis B infection   . Incisional hernia   . Lumbar radiculopathy   . Memory impairment   . Myopathy     Patient Active Problem List   Diagnosis Date Noted  . Lumbar radiculopathy, chronic 10/06/2015  . Spinal stenosis, lumbar region, without neurogenic claudication 10/06/2015  . Epigastric abdominal pain 10/12/2011  . HEPATITIS B, CHRONIC 08/17/2007  . LEUKEMIA, HAIRY CELL 08/17/2007  . PNEUMONIA, LEFT LOWER LOBE 08/17/2007  . ABSCESS, RECTUM 08/17/2007  . LIVER FUNCTION TESTS, ABNORMAL 08/17/2007  . HYPONATREMIA, HX OF 08/17/2007  . GASTRITIS 03/20/2007    Past Surgical History:  Procedure Laterality Date  . HERNIA REPAIR     laparoscopic ventral   . SPLENECTOMY, TOTAL         Home Medications    Prior to Admission medications   Medication Sig Start Date End Date Taking? Authorizing Provider  traMADol (ULTRAM) 50 MG tablet Take 2 tablets (100 mg total) by mouth  every 8 (eight) hours as needed. 10/06/15  Yes Charlett Blake, MD  VIREAD 300 MG tablet Take 300 mg by mouth daily.  08/11/14  Yes Historical Provider, MD    Family History Family History  Problem Relation Age of Onset  . Liver cancer Mother   . Diabetes Father   . Liver cancer Brother   . Heart attack Brother 16  . Heart attack Sister 45    Social History Social History  Substance Use Topics  . Smoking status: Current Some Day Smoker    Packs/day: 0.50    Types: Cigarettes  . Smokeless tobacco: Never Used  . Alcohol use No     Allergies   Metrizamide and Iodinated diagnostic agents   Review of Systems Review of Systems  Constitutional: Negative for chills and fever.  HENT: Negative for ear pain and sore throat.   Eyes: Negative for pain and visual disturbance.  Respiratory: Negative for cough and shortness of breath.   Cardiovascular: Negative for chest pain and palpitations.  Gastrointestinal: Positive for abdominal distention. Negative for abdominal pain and vomiting.  Genitourinary: Negative for dysuria and hematuria.  Musculoskeletal: Negative for arthralgias and back pain.  Skin: Positive for color change. Negative for rash.  Neurological: Negative for seizures and syncope.  All other systems reviewed and are negative.    Physical Exam Updated Vital Signs BP 124/87   Pulse 70   Temp 97.7 F (36.5 C) (Oral)   Resp 17   Ht 5\' 9"  (  1.753 m)   Wt 176 lb (79.8 kg)   SpO2 99%   BMI 25.99 kg/m   Physical Exam  Constitutional: He is oriented to person, place, and time. He appears well-developed and well-nourished. No distress.  HENT:  Head: Normocephalic and atraumatic.  Nose: Nose normal.  Eyes: Conjunctivae and EOM are normal. Pupils are equal, round, and reactive to light. Right eye exhibits no discharge. Left eye exhibits no discharge. No scleral icterus.  Neck: Normal range of motion. Neck supple.  Cardiovascular: Normal rate and regular rhythm.   Exam reveals no gallop and no friction rub.   No murmur heard. Pulmonary/Chest: Effort normal and breath sounds normal. No stridor. No respiratory distress. He has no rales.  Abdominal: Soft. He exhibits no distension. There is no tenderness.  Musculoskeletal: He exhibits no edema or tenderness.  Neurological: He is alert and oriented to person, place, and time.  Skin: Skin is warm and dry. No rash noted. He is not diaphoretic. No erythema.  Psychiatric: He has a normal mood and affect.  Vitals reviewed.    ED Treatments / Results  Labs (all labs ordered are listed, but only abnormal results are displayed) Labs Reviewed  BASIC METABOLIC PANEL - Abnormal; Notable for the following:       Result Value   Glucose, Bld 110 (*)    Calcium 8.8 (*)    All other components within normal limits  CBC - Abnormal; Notable for the following:    RBC 3.97 (*)    HCT 37.1 (*)    MCHC 36.1 (*)    RDW 21.2 (*)    All other components within normal limits  URINALYSIS, ROUTINE W REFLEX MICROSCOPIC (NOT AT [facility]) - Abnormal; Notable for the following:    Color, Urine AMBER (*)    Bilirubin Urine LARGE (*)    All other components within normal limits  HEPATIC FUNCTION PANEL - Abnormal; Notable for the following:    Albumin 2.8 (*)    AST 533 (*)    ALT 444 (*)    Alkaline Phosphatase 284 (*)    Total Bilirubin 12.1 (*)    Bilirubin, Direct 6.5 (*)    Indirect Bilirubin 5.6 (*)    All other components within normal limits  BRAIN NATRIURETIC PEPTIDE    EKG  EKG Interpretation None       Radiology No results found.  Procedures Procedures (including critical care time)  Medications Ordered in ED Medications - No data to display   Initial Impression / Assessment and Plan / ED Course  I have reviewed the triage vital signs and the nursing notes.  Pertinent labs & imaging results that were available during my care of the patient were reviewed by me and considered in my medical  decision making (see chart for details).  Clinical Course    Labs confirming hepatitis and hyperbilirubinemia. Unable to compare to most recent values. Last studies and records were from 2013 and were within normal limits.  Patient denies any infectious symptoms other than hepatitis. No evidence to suggest SBP. No significant electrolyte derangements. Hemoglobin within normal limits.  Discussed findings with patient who is requesting to be discharged. Patient is ambulating well without complications. Feel discharge is appropriate with close follow-up with the patient's hepatologist.   Final Clinical Impressions(s) / ED Diagnoses   Final diagnoses:  Other fatigue  Transaminitis  Hyperbilirubinemia   Disposition: Discharge  Condition: Good  I have discussed the results, Dx and Tx plan with the  patient who expressed understanding and agree(s) with the plan. Discharge instructions discussed at great length. The patient was given strict return precautions who verbalized understanding of the instructions. No further questions at time of discharge.    Discharge Medication List as of 03/12/2016  6:44 PM      Follow Up: Marty Heck, MD [address] [id number] [place] [address] [phone]  Schedule an appointment as soon as possible for a visit in 3 days for close monitoring of liver enzymes      Fatima Blank, MD 03/13/16 [phone]

## 2016-03-12 NOTE — ED Triage Notes (Addendum)
PT C/O INCREASED FATIGUE, JAUNDICED SKIN AND EYES, ITCHING, AND DARK STOOLS X1 MONTH. PT STS HE HAS HAD HEPATITIS B FOR 8 YEARS, AND HE MISSED A MONTH OF HIS MEDICATION, AND NOW HAS THESE SYMPTOMS.

## 2016-03-12 NOTE — ED Notes (Signed)
Patient presents with fatigue that he believes is secondary to liver failure.  Patient was dx with Hep B and was on medications for a "long time," and stopped taking them because he ran out. Patient restarted his medications one month ago, but continues to feel fatigued.  Patient c/o diffuse pruritis.  Patient denies N/V/D and fever.  Patient's abdomen is somewhat distended, but soft and non-tender to palpation.  Patient is diffusely icteric.

## 2016-03-15 ENCOUNTER — Telehealth: Payer: Self-pay | Admitting: Physical Medicine & Rehabilitation

## 2016-03-15 ENCOUNTER — Other Ambulatory Visit: Payer: Self-pay | Admitting: *Deleted

## 2016-03-15 MED ORDER — TRAMADOL HCL 50 MG PO TABS: 100.0000 mg | ORAL_TABLET | Freq: Three times a day (TID) | ORAL | 0 refills | Status: DC | PRN

## 2016-03-15 NOTE — Telephone Encounter (Signed)
Called in RX, notified pt and reminded pt that he has an upcoming appt with Dr. Letta Pate

## 2016-03-15 NOTE — Telephone Encounter (Signed)
Patient needs a refill on Tramadol. 

## 2016-04-08 ENCOUNTER — Ambulatory Visit (HOSPITAL_BASED_OUTPATIENT_CLINIC_OR_DEPARTMENT_OTHER): Payer: Medicaid Other | Admitting: Physical Medicine & Rehabilitation

## 2016-04-08 ENCOUNTER — Encounter: Payer: Medicaid Other | Attending: Physical Medicine & Rehabilitation

## 2016-04-08 ENCOUNTER — Encounter: Payer: Self-pay | Admitting: Physical Medicine & Rehabilitation

## 2016-04-08 VITALS — BP 119/82 | HR 78 | Resp 14

## 2016-04-08 DIAGNOSIS — G8929 Other chronic pain: Secondary | ICD-10-CM | POA: Diagnosis not present

## 2016-04-08 DIAGNOSIS — M5116 Intervertebral disc disorders with radiculopathy, lumbar region: Secondary | ICD-10-CM | POA: Diagnosis not present

## 2016-04-08 DIAGNOSIS — M5416 Radiculopathy, lumbar region: Secondary | ICD-10-CM | POA: Diagnosis not present

## 2016-04-08 DIAGNOSIS — M48061 Spinal stenosis, lumbar region without neurogenic claudication: Secondary | ICD-10-CM | POA: Diagnosis not present

## 2016-04-08 DIAGNOSIS — F17210 Nicotine dependence, cigarettes, uncomplicated: Secondary | ICD-10-CM | POA: Diagnosis not present

## 2016-04-08 MED ORDER — TRAMADOL HCL 50 MG PO TABS: 100.0000 mg | ORAL_TABLET | Freq: Two times a day (BID) | ORAL | 5 refills | Status: DC | PRN

## 2016-04-08 MED ORDER — GABAPENTIN 100 MG PO CAPS: 100.0000 mg | ORAL_CAPSULE | Freq: Two times a day (BID) | ORAL | 1 refills | Status: DC

## 2016-04-08 NOTE — Patient Instructions (Addendum)
Tramadol is good for the back pain, because of your liver problems, we will reduce it to twice a day    Gabapentin is good for shooting pains down the right leg, we will start with 100 mg twice a day. However, we may need to increase the dose if you do not feel a good effect, please call me in one week

## 2016-04-08 NOTE — Progress Notes (Signed)
Subjective:    Patient ID: [name], male    DOB: [birth year], 60 y.o.   MRN: [id number]  HPI  ER visit for fatigue, elevated bilirubin and elevated transaminase. Recommendation to follow-up with his hepatologist. He will go to [facility] next week.  Patient has been very weak, has not been moving as much. He feels like his legs are getting a bit weaker from not using them.  No bowel or bladder issues.  Patient has been on gabapentin in the past. Unclear why he is off this currently. This was resumed as per last clinic visit Pain Inventory Average Pain 8 Pain Right Now 7 My pain is sharp  In the last 24 hours, has pain interfered with the following? General activity 5 Relation with others 0 Enjoyment of life 2 What TIME of day is your pain at its worst? all Sleep (in general) Poor  Pain is worse with: walking, bending, sitting and standing Pain improves with: rest Relief from Meds: 3  Mobility walk without assistance how many minutes can you walk? 5 ability to climb steps?  no do you drive?  yes  Function what is your job? [employer]  Neuro/Psych numbness  Prior Studies Any changes since last visit?  no  Physicians involved in your care Any changes since last visit?  no   Family History  Problem Relation Age of Onset  . Liver cancer Mother   . Diabetes Father   . Liver cancer Brother   . Heart attack Brother 1  . Heart attack Sister 10   Social History   Social History  . Marital status: Married    Spouse name: N/A  . Number of children: 3  . Years of education: N/A   Occupational History  . Unemployed    Social History Main Topics  . Smoking status: Current Some Day Smoker    Packs/day: 0.50    Types: Cigarettes  . Smokeless tobacco: Never Used  . Alcohol use No  . Drug use: No  . Sexual activity: Not Asked   Other Topics Concern  . None   Social History Narrative   Some caffeine use    Past Surgical History:  Procedure Laterality  Date  . HERNIA REPAIR     laparoscopic ventral   . SPLENECTOMY, TOTAL     Past Medical History:  Diagnosis Date  . Cervical radiculopathy   . GERD (gastroesophageal reflux disease)   . Hairy cell leukemia ([place]) 2005    Last chemo 2006  . Hepatitis B infection   . Incisional hernia   . Lumbar radiculopathy   . Memory impairment   . Myopathy    BP 119/82   Pulse 78   Resp 14   SpO2 97%   Opioid Risk Score:   Fall Risk Score:  `1  Depression screen PHQ 2/9  Depression screen [facility] 2/9 04/08/2016 10/06/2015  Decreased Interest 3 3  Down, Depressed, Hopeless 0 0  PHQ - 2 Score 3 3  Altered sleeping - 0  Tired, decreased energy - 1  Change in appetite - 1  Feeling bad or failure about yourself  - 1  Trouble concentrating - 1  Moving slowly or fidgety/restless - 0  Suicidal thoughts - 0  PHQ-9 Score - 7    Review of Systems  All other systems reviewed and are negative.      Objective:   Physical Exam  Constitutional: He is oriented to person, place, and time. He appears  well-developed and well-nourished.  HENT:  Head: Normocephalic and atraumatic.  Eyes: Pupils are equal, round, and reactive to light.  Neurological: He is alert and oriented to person, place, and time.  Psychiatric: He has a normal mood and affect.  Nursing note and vitals reviewed.  Lumbar spine More pain with twisting toward the right than the left side. Limited range of motion  Motor strength is 5/5. Bilateral hip flexor, knee extensor, ankle dorsal flexor Decreased sensation L3 and L4, L 5, S1 dermatomal distribution on the right side .    Assessment & Plan:  1. Lumbar degenerative disc with chronic right L4 and right L5 radiculopathies. In the past he has been treated with tramadol 100 mg 3 times a day which was helpful for his pain complaints, because of his liver issues, will reduced to twice a day. 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 We discussed that if he does not have adequate relief with the medications, the next step would be to undergo right L4/5 translaminar lumbar epidural injection . This can be done without contrast enhancement, fluoroscopy with loss-of-resistance technique  He will need to follow-up with his hepatologist, hopefully the patient can remain on his tramadol since this has been helpful for his pain

## 2016-08-22 ENCOUNTER — Other Ambulatory Visit: Payer: Self-pay | Admitting: Physical Medicine & Rehabilitation

## 2016-09-03 ENCOUNTER — Other Ambulatory Visit: Payer: Self-pay | Admitting: Physical Medicine & Rehabilitation

## 2016-09-06 ENCOUNTER — Telehealth: Payer: Self-pay | Admitting: *Deleted

## 2016-09-06 NOTE — Telephone Encounter (Addendum)
Called and left message for [name] about his dose of tramadol being 2 tablets twice daily and not 3 times daily. I requested a call back to confirm he got message. (DPR states we can leave detailed message) [name] called back and I verified with him that he is to take only 2 twice daily due to concern about liver by Dr Letta Pate.  Refill will be sent in as 2 bid #[phone].

## 2016-09-23 ENCOUNTER — Telehealth: Payer: Self-pay | Admitting: Physical Medicine & Rehabilitation
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
## 2016-09-24 MED ORDER — TRAMADOL HCL 50 MG PO TABS: 100.0000 mg | ORAL_TABLET | Freq: Two times a day (BID) | ORAL | 0 refills | Status: DC
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
## 2016-09-24 NOTE — Telephone Encounter (Signed)
Patients wife called, she reiterated that the medication was not stolen and that her husband was absent minded when cleaning out the car.  I spoke face to face with Dr. Letta Pate about this. He agreed to a two week bridge script on the basis that the patient shows up for his May 3rd, 2018 appointment.  I contacted the family, I contacted the pharmacy.
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
## 2016-09-24 NOTE — Addendum Note (Signed)
Addended by: Geryl Rankins D on: 09/24/2016 05:02 PM   Modules accepted: Orders

## 2016-10-07 ENCOUNTER — Encounter: Payer: Self-pay | Admitting: Physical Medicine & Rehabilitation

## 2016-10-07 ENCOUNTER — Encounter: Payer: Medicaid Other | Attending: Physical Medicine & Rehabilitation

## 2016-10-07 ENCOUNTER — Ambulatory Visit (HOSPITAL_BASED_OUTPATIENT_CLINIC_OR_DEPARTMENT_OTHER): Payer: Medicaid Other | Admitting: Physical Medicine & Rehabilitation

## 2016-10-07 VITALS — BP 137/89 | HR 67

## 2016-10-07 DIAGNOSIS — M545 Low back pain: Secondary | ICD-10-CM | POA: Diagnosis not present

## 2016-10-07 DIAGNOSIS — M48061 Spinal stenosis, lumbar region without neurogenic claudication: Secondary | ICD-10-CM | POA: Insufficient documentation

## 2016-10-07 DIAGNOSIS — M5126 Other intervertebral disc displacement, lumbar region: Secondary | ICD-10-CM | POA: Diagnosis not present

## 2016-10-07 DIAGNOSIS — K219 Gastro-esophageal reflux disease without esophagitis: Secondary | ICD-10-CM | POA: Insufficient documentation

## 2016-10-07 DIAGNOSIS — M5416 Radiculopathy, lumbar region: Secondary | ICD-10-CM | POA: Insufficient documentation

## 2016-10-07 DIAGNOSIS — G8929 Other chronic pain: Secondary | ICD-10-CM | POA: Insufficient documentation

## 2016-10-07 DIAGNOSIS — F17210 Nicotine dependence, cigarettes, uncomplicated: Secondary | ICD-10-CM | POA: Insufficient documentation

## 2016-10-07 MED ORDER — TRAMADOL HCL 50 MG PO TABS: 100.0000 mg | ORAL_TABLET | Freq: Two times a day (BID) | ORAL | 5 refills | Status: DC

## 2016-10-07 MED ORDER — TRAMADOL HCL 50 MG PO TABS: 100.0000 mg | ORAL_TABLET | Freq: Two times a day (BID) | ORAL | 0 refills | Status: DC

## 2016-10-07 NOTE — Patient Instructions (Signed)
Need driver for next visit

## 2016-10-07 NOTE — Progress Notes (Signed)
Subjective:     Patient ID: [name], male   DOB: [birth year], 61 y.o.   MRN: [id number]  HPI Pain in low back limits his work.  Starting to walk more outside Also has neck pain, No upper or lower limb weakness, his chronic pain in the  Pain Inventory Average Pain 8 Pain Right Now 8 My pain is sharp  In the last 24 hours, has pain interfered with the following? General activity 9 Relation with others 0 Enjoyment of life 2 What TIME of day is your pain at its worst? daytime Sleep (in general) Poor  Pain is worse with: walking, bending, inactivity and standing Pain improves with: rest and medication Relief from Meds: 7  Mobility walk without assistance ability to climb steps?  no do you drive?  yes  Function not employed: date last employed .  Neuro/Psych No problems in this area  Prior Studies Any changes since last visit?  no *RADIOLOGY REPORT*  Clinical Data: Low back pain radiating into the right leg for 6 months.  No known injury or prior relevant surgery.  History of leukemia.  MRI LUMBAR SPINE WITHOUT CONTRAST  Technique:  Multiplanar and multiecho pulse sequences of the lumbar spine were obtained without intravenous contrast.  Comparison: Lumbar spine radiographs 05/23/2012.  Abdominal pelvic CT 10/14/2011 and abdominal MRI 06/10/2009.  Findings: The lumbar alignment is normal.  There is no evidence of acute fracture or pars defect.  There are scattered chronic Schmorl's nodes.  In addition, nonspecific T2 hyperintense lesions are present posteriorly in the L1 and L2 vertebral bodies, unchanged from the prior abdominal MRI.  There are no suspicious osseous findings.  The conus medullaris extends to the T12 level and appears normal. Mildly prominent retroperitoneal lymph nodes are unchanged.  A right renal cyst is partially imaged.  There are no significant disc space findings at L1-L2 or L2-L3.  L3-L4:  Mild disc bulging and mild  facet hypertrophy.  No spinal stenosis or nerve root encroachment. This disc is partially calcified on CT.  L4-L5: There is disc bulging with a shallow right paracentral disc protrusion.  This narrows the right lateral recess and may contribute to right L5 nerve root encroachment.  There is no foraminal stenosis or L4 nerve root encroachment. The facet joints are mildly hypertrophied bilaterally.  L5-S1:  Mild disc bulging and bilateral facet hypertrophy.  No spinal stenosis or nerve root encroachment.  IMPRESSION:  1.  Shallow right paracentral disc protrusion at L4-L5 narrows the right lateral recess and may contribute to right L5 nerve root encroachment. 2.  No other significant spinal stenosis.  The foramina at all levels appear sufficiently patent. 3.  No evidence of leukemia within the lumbar spine.  Mildly prominent retroperitoneal lymph nodes are unchanged from prior CT.   Original Report Authenticated By: Richardean Sale, M.D. Physicians involved in your care Any changes since last visit?  no   Family History  Problem Relation Age of Onset  . Liver cancer Mother   . Diabetes Father   . Liver cancer Brother   . Heart attack Brother 19  . Heart attack Sister 72   Social History   Social History  . Marital status: Married    Spouse name: N/A  . Number of children: 3  . Years of education: N/A   Occupational History  . Unemployed    Social History Main Topics  . Smoking status: Current Some Day Smoker    Packs/day: 0.50    Types:  Cigarettes  . Smokeless tobacco: Never Used  . Alcohol use No  . Drug use: No  . Sexual activity: Not Asked   Other Topics Concern  . None   Social History Narrative   Some caffeine use    Past Surgical History:  Procedure Laterality Date  . HERNIA REPAIR     laparoscopic ventral   . SPLENECTOMY, TOTAL     Past Medical History:  Diagnosis Date  . Cervical radiculopathy   . GERD (gastroesophageal reflux  disease)   . Hairy cell leukemia ([place]) 2005    Last chemo 2006  . Hepatitis B infection   . Incisional hernia   . Lumbar radiculopathy   . Memory impairment   . Myopathy    BP 137/89 (BP Location: Left Arm, Patient Position: Sitting, Cuff Size: Normal)   Pulse 67   SpO2 98%   Opioid Risk Score:   Fall Risk Score:  `1  Depression screen PHQ 2/9  Depression screen [facility] 2/9 04/08/2016 10/06/2015  Decreased Interest 3 3  Down, Depressed, Hopeless 0 0  PHQ - 2 Score 3 3  Altered sleeping - 0  Tired, decreased energy - 1  Change in appetite - 1  Feeling bad or failure about yourself  - 1  Trouble concentrating - 1  Moving slowly or fidgety/restless - 0  Suicidal thoughts - 0  PHQ-9 Score - 7    Review of Systems  Constitutional: Negative.   HENT: Negative.   Eyes: Negative.   Respiratory: Negative.   Cardiovascular: Negative.   Gastrointestinal: Negative.   Endocrine: Negative.   Genitourinary: Negative.   Musculoskeletal: Positive for arthralgias, back pain, myalgias, neck pain and neck stiffness.  Skin: Negative.   Allergic/Immunologic: Negative.   Neurological:       Neuropathy  Hematological: Negative.   Psychiatric/Behavioral: Negative.   All other systems reviewed and are negative.      Objective:   Physical Exam  Constitutional: He is oriented to person, place, and time. He appears well-developed and well-nourished.  HENT:  Head: Normocephalic and atraumatic.  Eyes: Conjunctivae and EOM are normal. Pupils are equal, round, and reactive to light.  Neck: Normal range of motion.  Neurological: He is alert and oriented to person, place, and time.  Psychiatric: He has a normal mood and affect.  Nursing note and vitals reviewed.  Mildly diminished lumbar range of motion. Tenderness palpation, right greater than the left lumbar paraspinal area down to PSIS on the right   motor strength is 5/5 bilateral hip flexor, knee extensor, ankle dorsiflexor. Negative  straight leg raising Ambulance without evidence toe drag or knee instability. Assessment:    1. Chronic right L4 and L5  radiculopathy.   2. Chronic low back pain, right greater than left-sided light. The related to lumbar facet arthropathy, plus minus discogenic Plan:     1. Noncontrast right L4-L translaminar epidural steroid injection under fluoroscopic guidance. We'll use loss-of-resistance technique given his allergy to non-iodinated contrast  2. Consider right L3, L4, L5 lumbar medial branch blocks under fluoroscopic guidance without contrast, we will see how he responds to L4-5 translaminar injection  Continue tramadol on her milligrams twice a day, do not plan to use narcotic analgesics    Sedentary work restriction

## 2016-11-05 ENCOUNTER — Ambulatory Visit: Payer: Medicaid Other | Admitting: Physical Medicine & Rehabilitation

## 2016-11-12 ENCOUNTER — Other Ambulatory Visit: Payer: Self-pay | Admitting: Physical Medicine & Rehabilitation

## 2016-11-22 ENCOUNTER — Telehealth: Payer: Self-pay | Admitting: *Deleted

## 2016-11-22 NOTE — Telephone Encounter (Signed)
Mrs Lacinda Axon called and they are leaving to go out of the country and need his tramadol.  By [employer] he filled #40 on 11/18/16.  I talked with the pharmacist and they can give him the final #80 he is due.  I notified Mrs Steib.

## 2016-11-23 ENCOUNTER — Ambulatory Visit: Payer: Medicaid Other | Admitting: Physical Medicine & Rehabilitation

## 2016-12-13 DIAGNOSIS — M5416 Radiculopathy, lumbar region: Secondary | ICD-10-CM | POA: Diagnosis not present

## 2016-12-13 DIAGNOSIS — E559 Vitamin D deficiency, unspecified: Secondary | ICD-10-CM | POA: Diagnosis not present

## 2016-12-13 DIAGNOSIS — H6592 Unspecified nonsuppurative otitis media, left ear: Secondary | ICD-10-CM | POA: Diagnosis not present

## 2017-02-11 ENCOUNTER — Telehealth: Payer: Self-pay | Admitting: *Deleted

## 2017-02-11 NOTE — Telephone Encounter (Signed)
Patients wife called and left a message asking for early refill on tramadol. She stated that he is leaving on a trip.   I called their pharmacy to see what date it could be filled.  They said the earliest it could be filled is September 11th. The pharmacy staff member indicated that there is a warning to restrict early refills.

## 2017-03-07 ENCOUNTER — Telehealth: Payer: Self-pay | Admitting: *Deleted

## 2017-03-07 NOTE — Telephone Encounter (Signed)
Mr Longman called for refill on his tramadol.  He will need to make an appt as he has not been seen since May of this year. Pt notified.

## 2017-03-08 ENCOUNTER — Encounter: Payer: Self-pay | Admitting: Physical Medicine & Rehabilitation

## 2017-03-08 ENCOUNTER — Ambulatory Visit (HOSPITAL_BASED_OUTPATIENT_CLINIC_OR_DEPARTMENT_OTHER): Payer: Self-pay | Admitting: Physical Medicine & Rehabilitation

## 2017-03-08 ENCOUNTER — Encounter: Payer: Medicare Other | Attending: Physical Medicine & Rehabilitation

## 2017-03-08 VITALS — BP 140/95 | HR 67

## 2017-03-08 DIAGNOSIS — M48061 Spinal stenosis, lumbar region without neurogenic claudication: Secondary | ICD-10-CM | POA: Diagnosis not present

## 2017-03-08 DIAGNOSIS — M5416 Radiculopathy, lumbar region: Secondary | ICD-10-CM | POA: Diagnosis not present

## 2017-03-08 DIAGNOSIS — M5116 Intervertebral disc disorders with radiculopathy, lumbar region: Secondary | ICD-10-CM | POA: Diagnosis not present

## 2017-03-08 DIAGNOSIS — F17210 Nicotine dependence, cigarettes, uncomplicated: Secondary | ICD-10-CM | POA: Diagnosis not present

## 2017-03-08 MED ORDER — TRAMADOL HCL 50 MG PO TABS: 100.0000 mg | ORAL_TABLET | Freq: Two times a day (BID) | ORAL | 5 refills | Status: DC

## 2017-03-08 NOTE — Patient Instructions (Addendum)
There are 2 procedures that you may benefit from  For leg pain and numbness pain I would recommend lumbar epidural injection. This would be done without contrast and with x-ray guidance. If the cortisone dose remains low and it is not performed. Frequently, there should be no damage to any bone structures.  The other procedure. We discussed was lumbar medial branch blocks. In your case. I would block the L3 and L4 medial branch and the L5 dorsal ramus on the right side only. This procedure would use no contrast, and no cortisone If this procedure only gives a short-term benefit, there is a longer lasting procedure called lumbar radiofrequency neurotomy.  Please call if you would like to schedule a procedure

## 2017-03-08 NOTE — Progress Notes (Signed)
Subjective:    Patient ID: [name], male    DOB: 04/25/1956, 61 y.o.   MRN: [id number]  HPI Discussed PMP overdose score 110.  This actually is not increased over the general population. Patient has had chronic back pain, no new issues except for some increased right knee pain. No trauma to the right knee. Patient has had no weakness or bowel or bladder dysfunction Pain Inventory Average Pain 8 Pain Right Now 8 My pain is .  In the last 24 hours, has pain interfered with the following? General activity 3 Relation with others 4 Enjoyment of life 2 What TIME of day is your pain at its worst? night Sleep (in general) Fair  Pain is worse with: walking, sitting, inactivity, standing and some activites Pain improves with: rest, pacing activities and medication Relief from Meds: 9  Mobility walk without assistance ability to climb steps?  no do you drive?  yes  Function Do you have any goals in this area?  no  Neuro/Psych numbness tingling trouble walking dizziness depression  Prior Studies Any changes since last visit?  no  Physicians involved in your care Any changes since last visit?  no   Family History  Problem Relation Age of Onset  . Liver cancer Mother   . Diabetes Father   . Liver cancer Brother   . Heart attack Brother 34  . Heart attack Sister 28   Social History   Social History  . Marital status: Married    Spouse name: N/A  . Number of children: 3  . Years of education: N/A   Occupational History  . Unemployed    Social History Main Topics  . Smoking status: Current Some Day Smoker    Packs/day: 0.50    Types: Cigarettes  . Smokeless tobacco: Never Used  . Alcohol use No  . Drug use: No  . Sexual activity: Not on file   Other Topics Concern  . Not on file   Social History Narrative   Some caffeine use    Past Surgical History:  Procedure Laterality Date  . HERNIA REPAIR     laparoscopic ventral   . SPLENECTOMY,  TOTAL     Past Medical History:  Diagnosis Date  . Cervical radiculopathy   . GERD (gastroesophageal reflux disease)   . Hairy cell leukemia ([place]) 2005    Last chemo 2006  . Hepatitis B infection   . Incisional hernia   . Lumbar radiculopathy   . Memory impairment   . Myopathy    There were no vitals taken for this visit.  Opioid Risk Score:   Fall Risk Score:  `1  Depression screen PHQ 2/9  Depression screen [facility] 2/9 04/08/2016 10/06/2015  Decreased Interest 3 3  Down, Depressed, Hopeless 0 0  PHQ - 2 Score 3 3  Altered sleeping - 0  Tired, decreased energy - 1  Change in appetite - 1  Feeling bad or failure about yourself  - 1  Trouble concentrating - 1  Moving slowly or fidgety/restless - 0  Suicidal thoughts - 0  PHQ-9 Score - 7     Review of Systems  Constitutional: Positive for unexpected weight change.  HENT: Negative.   Eyes: Negative.   Respiratory: Negative.   Cardiovascular: Negative.   Gastrointestinal: Negative.   Endocrine: Negative.   Genitourinary: Negative.   Musculoskeletal: Negative.   Skin: Negative.   Allergic/Immunologic: Negative.   Neurological: Negative.   Hematological: Negative.   Psychiatric/Behavioral:  Negative.   All other systems reviewed and are negative.      Objective:   Physical Exam  Constitutional: He is oriented to person, place, and time. He appears well-developed and well-nourished.  Neurological: He is alert and oriented to person, place, and time.  Reflex Scores:      Patellar reflexes are 2+ on the right side and 2+ on the left side.      Achilles reflexes are 0 on the right side and 0 on the left side. Reduced R L4 sensation  Skin: Skin is warm and dry.  Nursing note and vitals reviewed. Lumbar spine has semi-5% range flexion, extension, lateral bending and rotation. Motor strength is 5/5 bilateral hip flexors, knee extensors, ankle dorsiflexors and ankle plantar flexors. Gait is without evidence of to drag or  knee instability. Mood and affect are appropriate        Assessment & Plan:   1. Lumbar degenerative disc with chronic L4 and L5 radicular symptoms but no motor weakness. We discussed his previous treatments including medication management. He has already tried physical therapy without much benefit. We discussed injection is next step. We discussed the difference between epidural injections as well as medial branch blocks. He will think about this. He is concerned about the effects of corticosteroids on bone stock. We discussed that this isn't issue if certain dosing guidelines are not adhered to. We also discussed that medial branch blocks do not require the use of corticosteroid medications. For now, we'll continue tramadol 100 mg twice a day No  Signs of misuse

## 2017-04-25 DIAGNOSIS — Z1211 Encounter for screening for malignant neoplasm of colon: Secondary | ICD-10-CM | POA: Diagnosis not present

## 2017-04-25 DIAGNOSIS — B181 Chronic viral hepatitis B without delta-agent: Secondary | ICD-10-CM | POA: Diagnosis not present

## 2017-04-25 DIAGNOSIS — C9141 Hairy cell leukemia, in remission: Secondary | ICD-10-CM | POA: Diagnosis not present

## 2017-05-29 ENCOUNTER — Other Ambulatory Visit: Payer: Self-pay | Admitting: Physical Medicine & Rehabilitation

## 2017-06-02 ENCOUNTER — Telehealth: Payer: Self-pay | Admitting: *Deleted

## 2017-06-02 NOTE — Telephone Encounter (Signed)
[name] has called multiple times over the past 24 hours requesting early refill on tramadol for [name] as they are going out of town.  I called [facility] but they refuse to do an early refill because this is a chronic situation with them and they will no longer fill early.  I notified [name].

## 2017-08-26 ENCOUNTER — Ambulatory Visit (HOSPITAL_BASED_OUTPATIENT_CLINIC_OR_DEPARTMENT_OTHER): Payer: Medicare Other | Admitting: Physical Medicine & Rehabilitation

## 2017-08-26 ENCOUNTER — Encounter: Payer: Medicare Other | Attending: Physical Medicine & Rehabilitation

## 2017-08-26 ENCOUNTER — Encounter: Payer: Self-pay | Admitting: Physical Medicine & Rehabilitation

## 2017-08-26 VITALS — BP 124/86 | HR 69

## 2017-08-26 DIAGNOSIS — M5416 Radiculopathy, lumbar region: Secondary | ICD-10-CM

## 2017-08-26 DIAGNOSIS — Z79899 Other long term (current) drug therapy: Secondary | ICD-10-CM | POA: Diagnosis not present

## 2017-08-26 DIAGNOSIS — B1910 Unspecified viral hepatitis B without hepatic coma: Secondary | ICD-10-CM | POA: Diagnosis not present

## 2017-08-26 DIAGNOSIS — M5116 Intervertebral disc disorders with radiculopathy, lumbar region: Secondary | ICD-10-CM | POA: Insufficient documentation

## 2017-08-26 DIAGNOSIS — K432 Incisional hernia without obstruction or gangrene: Secondary | ICD-10-CM | POA: Insufficient documentation

## 2017-08-26 DIAGNOSIS — M5412 Radiculopathy, cervical region: Secondary | ICD-10-CM | POA: Insufficient documentation

## 2017-08-26 DIAGNOSIS — Z5181 Encounter for therapeutic drug level monitoring: Secondary | ICD-10-CM | POA: Diagnosis not present

## 2017-08-26 DIAGNOSIS — G729 Myopathy, unspecified: Secondary | ICD-10-CM | POA: Diagnosis not present

## 2017-08-26 DIAGNOSIS — M48061 Spinal stenosis, lumbar region without neurogenic claudication: Secondary | ICD-10-CM | POA: Diagnosis not present

## 2017-08-26 DIAGNOSIS — K219 Gastro-esophageal reflux disease without esophagitis: Secondary | ICD-10-CM | POA: Insufficient documentation

## 2017-08-26 MED ORDER — TRAMADOL HCL 50 MG PO TABS: 100.0000 mg | ORAL_TABLET | Freq: Two times a day (BID) | ORAL | 5 refills | Status: DC

## 2017-08-26 NOTE — Progress Notes (Signed)
Subjective:    Patient ID: [name], male    DOB: August 24, 1955, 62 y.o.   MRN: [id number]  HPI 62 year old male with hx of Hep B, seen at this clinic lumbar degenerative disc as well as chronic Right  L4-L5 radiculopathy  Tramadol 100mg  BID   Walks 10-62min but not everyday  Occ calisthenics  Does not do yardwork  Pain Inventory Average Pain 6 Pain Right Now 0 My pain is intermittent, sharp and depends on activity  In the last 24 hours, has pain interfered with the following? General activity 5 Relation with others 5 Enjoyment of life 6 What TIME of day is your pain at its worst? evening Sleep (in general) Fair  Pain is worse with: walking and bending Pain improves with: medication Relief from Meds: 5  Mobility walk without assistance  Function not employed: date last employed .  Neuro/Psych weakness numbness  Prior Studies Any changes since last visit?  no  Physicians involved in your care Any changes since last visit?  no   Family History  Problem Relation Age of Onset  . Liver cancer Mother   . Diabetes Father   . Liver cancer Brother   . Heart attack Brother 66  . Heart attack Sister 63   Social History   Socioeconomic History  . Marital status: Married    Spouse name: Not on file  . Number of children: 3  . Years of education: Not on file  . Highest education level: Not on file  Occupational History  . Occupation: Unemployed  Social Needs  . Financial resource strain: Not on file  . Food insecurity:    Worry: Not on file    Inability: Not on file  . Transportation needs:    Medical: Not on file    Non-medical: Not on file  Tobacco Use  . Smoking status: Current Some Day Smoker    Packs/day: 0.50    Types: Cigarettes  . Smokeless tobacco: Never Used  Substance and Sexual Activity  . Alcohol use: No    Alcohol/week: 0.0 oz  . Drug use: No  . Sexual activity: Not on file  Lifestyle  . Physical activity:    Days per  week: Not on file    Minutes per session: Not on file  . Stress: Not on file  Relationships  . Social connections:    Talks on phone: Not on file    Gets together: Not on file    Attends religious service: Not on file    Active member of club or organization: Not on file    Attends meetings of clubs or organizations: Not on file    Relationship status: Not on file  Other Topics Concern  . Not on file  Social History Narrative   Some caffeine use    Past Surgical History:  Procedure Laterality Date  . HERNIA REPAIR     laparoscopic ventral   . SPLENECTOMY, TOTAL     Past Medical History:  Diagnosis Date  . Cervical radiculopathy   . GERD (gastroesophageal reflux disease)   . Hairy cell leukemia ([place]) 2005    Last chemo 2006  . Hepatitis B infection   . Incisional hernia   . Lumbar radiculopathy   . Memory impairment   . Myopathy    There were no vitals taken for this visit.  Opioid Risk Score:   Fall Risk Score:  `1  Depression screen PHQ 2/9  Depression screen [facility] 2/9 04/08/2016 10/06/2015  Decreased Interest 3 3  Down, Depressed, Hopeless 0 0  PHQ - 2 Score 3 3  Altered sleeping - 0  Tired, decreased energy - 1  Change in appetite - 1  Feeling bad or failure about yourself  - 1  Trouble concentrating - 1  Moving slowly or fidgety/restless - 0  Suicidal thoughts - 0  PHQ-9 Score - 7     Review of Systems  Constitutional: Negative.   HENT: Negative.   Eyes: Negative.   Respiratory: Negative.   Cardiovascular: Negative.   Gastrointestinal: Negative.   Endocrine: Negative.   Genitourinary: Negative.   Musculoskeletal: Positive for arthralgias and myalgias.  Allergic/Immunologic: Negative.   Neurological: Negative.   Hematological: Negative.   Psychiatric/Behavioral: Negative.   All other systems reviewed and are negative.      Objective:   Physical Exam  Constitutional: He is oriented to person, place, and time. He appears well-developed and  well-nourished.  HENT:  Head: Normocephalic and atraumatic.  Eyes: Pupils are equal, round, and reactive to light. Conjunctivae and EOM are normal.  Neck: Normal range of motion.  Musculoskeletal:  Lumbar range of motion full flexion extension lateral bending and rotation. Negative straight leg raising  Neurological: He is alert and oriented to person, place, and time.  Motor strength is 5/5 bilateral hip flexor knee extensor ankle was flexor. Ambulates without assistive device   Psychiatric: He has a normal mood and affect. His behavior is normal. Thought content normal.  Nursing note and vitals reviewed.         Assessment & Plan:   1.  Lumbar degenerative disc with L4-L5 radiculopathy. His range of motion is actually doing better.  Encouraged him to exercise and continue stretching. We discussed that if his pain worsens, he would be a good candidate for lumbar injections. Indication for chronic opioid: Lumbar DDD with chronic radic Medication and dose: Tramdol 100mg  BID # pills per month: 120 Last UDS date: 08/26/17 Opioid Treatment Agreement signed (Y/N): Y Opioid Treatment Agreement last reviewed with patient:   3/22/19[facility] reviewed this encounter (include red flags):  08/26/17

## 2017-08-26 NOTE — Patient Instructions (Signed)
Please walk everyday!!!

## 2017-08-29 ENCOUNTER — Other Ambulatory Visit: Payer: Self-pay | Admitting: Physical Medicine & Rehabilitation

## 2017-09-02 LAB — TOXASSURE SELECT,+ANTIDEPR,UR

## 2017-09-06 ENCOUNTER — Ambulatory Visit: Payer: Medicaid Other | Admitting: Physical Medicine & Rehabilitation

## 2017-09-08 ENCOUNTER — Telehealth: Payer: Self-pay | Admitting: *Deleted

## 2017-09-08 NOTE — Telephone Encounter (Signed)
From what you are describing , there should be evidence of tramadol in UDS.  THerefore is inconsistent and would discharge

## 2017-09-08 NOTE — Telephone Encounter (Signed)
Urine drug screen is negative for tramadol.  He did not bring bottle, so no record of how many pills he had left.  Reported last dose same day as test 08/26/17. Last fill date was 08/01/17.  This is first visit after tramadol on list to be tested and subject to CSA, and his first UDS so nothing to compare. He needs CSA signed at next visit in September.

## 2017-09-09 NOTE — Telephone Encounter (Signed)
See staff message

## 2017-09-12 NOTE — Telephone Encounter (Signed)
-----   Message from Charlett Blake, MD sent at 09/12/2017  5:57 AM EDT ----- I think well need to get him in early for a pill count and sign the CSA It can be a nursing or NP visit ----- Message ----- From: Caro Hight, RN Sent: 09/09/2017   9:44 AM To: Charlett Blake, MD  Unfortunately, Mr Perry has never consented and Bruce didn't get one signed at the last visit. So by that can we discharge him if he didn't technically violate anything?

## 2017-09-14 ENCOUNTER — Encounter: Payer: Medicare Other | Attending: Physical Medicine & Rehabilitation | Admitting: Registered Nurse

## 2017-09-14 ENCOUNTER — Encounter: Payer: Self-pay | Admitting: Registered Nurse

## 2017-09-14 VITALS — BP 123/87 | HR 77 | Ht 67.0 in | Wt 177.0 lb

## 2017-09-14 DIAGNOSIS — B1910 Unspecified viral hepatitis B without hepatic coma: Secondary | ICD-10-CM | POA: Diagnosis not present

## 2017-09-14 DIAGNOSIS — K432 Incisional hernia without obstruction or gangrene: Secondary | ICD-10-CM | POA: Diagnosis not present

## 2017-09-14 DIAGNOSIS — K219 Gastro-esophageal reflux disease without esophagitis: Secondary | ICD-10-CM | POA: Diagnosis not present

## 2017-09-14 DIAGNOSIS — G894 Chronic pain syndrome: Secondary | ICD-10-CM

## 2017-09-14 DIAGNOSIS — Z5181 Encounter for therapeutic drug level monitoring: Secondary | ICD-10-CM | POA: Diagnosis not present

## 2017-09-14 DIAGNOSIS — Z79899 Other long term (current) drug therapy: Secondary | ICD-10-CM

## 2017-09-14 DIAGNOSIS — M48061 Spinal stenosis, lumbar region without neurogenic claudication: Secondary | ICD-10-CM | POA: Diagnosis not present

## 2017-09-14 DIAGNOSIS — M5412 Radiculopathy, cervical region: Secondary | ICD-10-CM | POA: Diagnosis not present

## 2017-09-14 DIAGNOSIS — G729 Myopathy, unspecified: Secondary | ICD-10-CM | POA: Diagnosis not present

## 2017-09-14 DIAGNOSIS — M5416 Radiculopathy, lumbar region: Secondary | ICD-10-CM

## 2017-09-14 DIAGNOSIS — M5116 Intervertebral disc disorders with radiculopathy, lumbar region: Secondary | ICD-10-CM | POA: Diagnosis not present

## 2017-09-14 NOTE — Progress Notes (Addendum)
Subjective:    Patient ID: [name], male    DOB: 1956/02/04, 62 y.o.   MRN: [id number]  HPI: [name] is a 62 year old male who returns for follow up appointment, pill count and review of  Narcotic Contract and signature. We reviewed his UDS, he states he had stopped taking his Tramadol for three weeks while performing a liver Herbal Cleanse. We discuss compliance with medication, no early refills. Also instructed to call his gastroenterologist before stopping his prescribe medication Viread, he verbalizes understanding.   [name] Morphine Equivalent is 23.33 MME.     Pain Inventory Average Pain 6 Pain Right Now 5 My pain is sharp  In the last 24 hours, has pain interfered with the following? General activity 5 Relation with others 1 Enjoyment of life 0 What TIME of day is your pain at its worst? evening Sleep (in general) Fair  Pain is worse with: walking Pain improves with: rest and medication Relief from Meds: 5  Mobility walk without assistance how many minutes can you walk? 10 Do you have any goals in this area?  no  Function Do you have any goals in this area?  no  Neuro/Psych No problems in this area  Prior Studies Any changes since last visit?  no  Physicians involved in your care Any changes since last visit?  no   Family History  Problem Relation Age of Onset  . Liver cancer Mother   . Diabetes Father   . Liver cancer Brother   . Heart attack Brother 67  . Heart attack Sister 75   Social History   Socioeconomic History  . Marital status: Married    Spouse name: Not on file  . Number of children: 3  . Years of education: Not on file  . Highest education level: Not on file  Occupational History  . Occupation: Unemployed  Social Needs  . Financial resource strain: Not on file  . Food insecurity:    Worry: Not on file    Inability: Not on file  . Transportation needs:    Medical: Not on file    Non-medical: Not  on file  Tobacco Use  . Smoking status: Current Some Day Smoker    Packs/day: 0.50    Types: Cigarettes  . Smokeless tobacco: Never Used  Substance and Sexual Activity  . Alcohol use: No    Alcohol/week: 0.0 oz  . Drug use: No  . Sexual activity: Not on file  Lifestyle  . Physical activity:    Days per week: Not on file    Minutes per session: Not on file  . Stress: Not on file  Relationships  . Social connections:    Talks on phone: Not on file    Gets together: Not on file    Attends religious service: Not on file    Active member of club or organization: Not on file    Attends meetings of clubs or organizations: Not on file    Relationship status: Not on file  Other Topics Concern  . Not on file  Social History Narrative   Some caffeine use    Past Surgical History:  Procedure Laterality Date  . HERNIA REPAIR     laparoscopic ventral   . SPLENECTOMY, TOTAL     Past Medical History:  Diagnosis Date  . Cervical radiculopathy   . GERD (gastroesophageal reflux disease)   . Hairy cell leukemia ([place]) 2005    Last chemo  2006  . Hepatitis B infection   . Incisional hernia   . Lumbar radiculopathy   . Memory impairment   . Myopathy    BP 123/87 (BP Location: Left Arm, Patient Position: Sitting, Cuff Size: Normal)   Pulse 77   Ht 5\' 7"  (1.702 m)   Wt 177 lb (80.3 kg)   SpO2 96%   BMI 27.72 kg/m   Opioid Risk Score:   Fall Risk Score:  `1  Depression screen PHQ 2/9  Depression screen [facility] 2/9 04/08/2016 10/06/2015  Decreased Interest 3 3  Down, Depressed, Hopeless 0 0  PHQ - 2 Score 3 3  Altered sleeping - 0  Tired, decreased energy - 1  Change in appetite - 1  Feeling bad or failure about yourself  - 1  Trouble concentrating - 1  Moving slowly or fidgety/restless - 0  Suicidal thoughts - 0  PHQ-9 Score - 7    Review of Systems  Constitutional: Negative.   HENT: Negative.   Eyes: Negative.   Respiratory: Negative.   Cardiovascular: Negative.     Gastrointestinal: Negative.   Endocrine: Negative.   Genitourinary: Negative.   Musculoskeletal: Positive for arthralgias and myalgias.  Skin: Negative.   Allergic/Immunologic: Negative.   Neurological: Negative.   Hematological: Negative.   Psychiatric/Behavioral: Negative.   All other systems reviewed and are negative.      Objective:   Physical Exam  Constitutional: He is oriented to person, place, and time. He appears well-developed and well-nourished.  HENT:  Head: Normocephalic and atraumatic.  Neck: Normal range of motion. Neck supple.  Cardiovascular: Normal rate and regular rhythm.  Pulmonary/Chest: Effort normal and breath sounds normal.  Musculoskeletal:  Normal Muscle Bulk and Muscle Testing Reveals: Upper Extremities: Full ROM and Muscle Strength 5/5 Back without spinal tenderness noted Lower Extremities: Full ROM and Muscle Strength 5/5 Arises from Chair with ease Narrow Based Gait  Neurological: He is alert and oriented to person, place, and time.  Skin: Skin is warm and dry.  Psychiatric: He has a normal mood and affect.  Nursing note and vitals reviewed.         Assessment & Plan:  1. Lumbar Degenerative Disc with Lumbar Radiculitis: Continue HEP as Tolerated.  Continue Tramadol 50 mg take two tablets twice a day as needed for pain #120.   Return in 5 months

## 2017-10-19 DIAGNOSIS — N281 Cyst of kidney, acquired: Secondary | ICD-10-CM | POA: Diagnosis not present

## 2017-10-19 DIAGNOSIS — B1910 Unspecified viral hepatitis B without hepatic coma: Secondary | ICD-10-CM | POA: Diagnosis not present

## 2017-10-19 DIAGNOSIS — K7460 Unspecified cirrhosis of liver: Secondary | ICD-10-CM | POA: Diagnosis not present

## 2017-11-04 DIAGNOSIS — C9141 Hairy cell leukemia, in remission: Secondary | ICD-10-CM | POA: Diagnosis not present

## 2017-11-04 DIAGNOSIS — E559 Vitamin D deficiency, unspecified: Secondary | ICD-10-CM | POA: Diagnosis not present

## 2017-11-04 DIAGNOSIS — K7469 Other cirrhosis of liver: Secondary | ICD-10-CM | POA: Diagnosis not present

## 2017-11-04 DIAGNOSIS — B181 Chronic viral hepatitis B without delta-agent: Secondary | ICD-10-CM | POA: Diagnosis not present

## 2017-11-04 DIAGNOSIS — Z1211 Encounter for screening for malignant neoplasm of colon: Secondary | ICD-10-CM | POA: Diagnosis not present

## 2017-12-07 DIAGNOSIS — R682 Dry mouth, unspecified: Secondary | ICD-10-CM | POA: Diagnosis not present

## 2017-12-07 DIAGNOSIS — R42 Dizziness and giddiness: Secondary | ICD-10-CM | POA: Diagnosis not present

## 2017-12-07 DIAGNOSIS — Z01118 Encounter for examination of ears and hearing with other abnormal findings: Secondary | ICD-10-CM | POA: Diagnosis not present

## 2017-12-14 DIAGNOSIS — H60312 Diffuse otitis externa, left ear: Secondary | ICD-10-CM | POA: Diagnosis not present

## 2017-12-14 DIAGNOSIS — Z01118 Encounter for examination of ears and hearing with other abnormal findings: Secondary | ICD-10-CM | POA: Diagnosis not present

## 2017-12-16 DIAGNOSIS — M545 Low back pain: Secondary | ICD-10-CM | POA: Diagnosis not present

## 2017-12-16 DIAGNOSIS — G8929 Other chronic pain: Secondary | ICD-10-CM | POA: Diagnosis not present

## 2017-12-21 DIAGNOSIS — M5386 Other specified dorsopathies, lumbar region: Secondary | ICD-10-CM | POA: Diagnosis not present

## 2017-12-21 DIAGNOSIS — M9903 Segmental and somatic dysfunction of lumbar region: Secondary | ICD-10-CM | POA: Diagnosis not present

## 2017-12-21 DIAGNOSIS — M9902 Segmental and somatic dysfunction of thoracic region: Secondary | ICD-10-CM | POA: Diagnosis not present

## 2017-12-21 DIAGNOSIS — M5417 Radiculopathy, lumbosacral region: Secondary | ICD-10-CM | POA: Diagnosis not present

## 2017-12-21 DIAGNOSIS — M5414 Radiculopathy, thoracic region: Secondary | ICD-10-CM | POA: Diagnosis not present

## 2017-12-21 DIAGNOSIS — M9905 Segmental and somatic dysfunction of pelvic region: Secondary | ICD-10-CM | POA: Diagnosis not present

## 2017-12-23 DIAGNOSIS — M9903 Segmental and somatic dysfunction of lumbar region: Secondary | ICD-10-CM | POA: Diagnosis not present

## 2017-12-23 DIAGNOSIS — M5386 Other specified dorsopathies, lumbar region: Secondary | ICD-10-CM | POA: Diagnosis not present

## 2017-12-23 DIAGNOSIS — M9902 Segmental and somatic dysfunction of thoracic region: Secondary | ICD-10-CM | POA: Diagnosis not present

## 2017-12-23 DIAGNOSIS — M5417 Radiculopathy, lumbosacral region: Secondary | ICD-10-CM | POA: Diagnosis not present

## 2017-12-23 DIAGNOSIS — M5414 Radiculopathy, thoracic region: Secondary | ICD-10-CM | POA: Diagnosis not present

## 2017-12-23 DIAGNOSIS — M9905 Segmental and somatic dysfunction of pelvic region: Secondary | ICD-10-CM | POA: Diagnosis not present

## 2017-12-28 DIAGNOSIS — M5414 Radiculopathy, thoracic region: Secondary | ICD-10-CM | POA: Diagnosis not present

## 2017-12-28 DIAGNOSIS — M5386 Other specified dorsopathies, lumbar region: Secondary | ICD-10-CM | POA: Diagnosis not present

## 2017-12-28 DIAGNOSIS — M9903 Segmental and somatic dysfunction of lumbar region: Secondary | ICD-10-CM | POA: Diagnosis not present

## 2017-12-28 DIAGNOSIS — M9902 Segmental and somatic dysfunction of thoracic region: Secondary | ICD-10-CM | POA: Diagnosis not present

## 2017-12-28 DIAGNOSIS — M9905 Segmental and somatic dysfunction of pelvic region: Secondary | ICD-10-CM | POA: Diagnosis not present

## 2017-12-28 DIAGNOSIS — M5417 Radiculopathy, lumbosacral region: Secondary | ICD-10-CM | POA: Diagnosis not present

## 2017-12-30 DIAGNOSIS — M5386 Other specified dorsopathies, lumbar region: Secondary | ICD-10-CM | POA: Diagnosis not present

## 2017-12-30 DIAGNOSIS — M5417 Radiculopathy, lumbosacral region: Secondary | ICD-10-CM | POA: Diagnosis not present

## 2017-12-30 DIAGNOSIS — M9905 Segmental and somatic dysfunction of pelvic region: Secondary | ICD-10-CM | POA: Diagnosis not present

## 2017-12-30 DIAGNOSIS — M5414 Radiculopathy, thoracic region: Secondary | ICD-10-CM | POA: Diagnosis not present

## 2017-12-30 DIAGNOSIS — M9903 Segmental and somatic dysfunction of lumbar region: Secondary | ICD-10-CM | POA: Diagnosis not present

## 2017-12-30 DIAGNOSIS — M9902 Segmental and somatic dysfunction of thoracic region: Secondary | ICD-10-CM | POA: Diagnosis not present

## 2018-02-24 ENCOUNTER — Encounter: Payer: Medicare Other | Attending: Registered Nurse | Admitting: Registered Nurse

## 2018-02-24 ENCOUNTER — Other Ambulatory Visit: Payer: Self-pay | Admitting: Physical Medicine & Rehabilitation

## 2018-02-24 ENCOUNTER — Encounter: Payer: Self-pay | Admitting: Registered Nurse

## 2018-02-24 VITALS — BP 122/83 | HR 64 | Resp 14 | Ht 66.0 in | Wt 178.0 lb

## 2018-02-24 DIAGNOSIS — Z760 Encounter for issue of repeat prescription: Secondary | ICD-10-CM | POA: Diagnosis not present

## 2018-02-24 DIAGNOSIS — Z79891 Long term (current) use of opiate analgesic: Secondary | ICD-10-CM

## 2018-02-24 DIAGNOSIS — M48061 Spinal stenosis, lumbar region without neurogenic claudication: Secondary | ICD-10-CM

## 2018-02-24 DIAGNOSIS — G8929 Other chronic pain: Secondary | ICD-10-CM | POA: Diagnosis not present

## 2018-02-24 DIAGNOSIS — M5416 Radiculopathy, lumbar region: Secondary | ICD-10-CM

## 2018-02-24 DIAGNOSIS — G894 Chronic pain syndrome: Secondary | ICD-10-CM

## 2018-02-24 DIAGNOSIS — Z5181 Encounter for therapeutic drug level monitoring: Secondary | ICD-10-CM | POA: Diagnosis not present

## 2018-02-24 DIAGNOSIS — M5116 Intervertebral disc disorders with radiculopathy, lumbar region: Secondary | ICD-10-CM | POA: Diagnosis not present

## 2018-02-24 DIAGNOSIS — K219 Gastro-esophageal reflux disease without esophagitis: Secondary | ICD-10-CM | POA: Diagnosis not present

## 2018-02-24 DIAGNOSIS — F17210 Nicotine dependence, cigarettes, uncomplicated: Secondary | ICD-10-CM | POA: Insufficient documentation

## 2018-02-24 MED ORDER — TRAMADOL HCL 50 MG PO TABS: 100.0000 mg | ORAL_TABLET | Freq: Two times a day (BID) | ORAL | 5 refills | Status: DC

## 2018-02-24 NOTE — Progress Notes (Signed)
Subjective:    Patient ID: [name], male    DOB: [birth year], 62 y.o.   MRN: [id number]  HPI: [name] is a 62 year old male who returns for follow up appointment for chromic pain and medication refill. He states his pain is located in his lower back radiating into his right lower extremity. He rates his pain 2. His current exercise regime is walking.   [name] Morphine Equivalent is 20.00 MME. UDS ordered today.    Pain Inventory Average Pain 6 Pain Right Now 2 My pain is sharp and tingling  In the last 24 hours, has pain interfered with the following? General activity 1 Relation with others 2 Enjoyment of life 6 What TIME of day is your pain at its worst? daytime Sleep (in general) Fair  Pain is worse with: walking and bending Pain improves with: rest and medication Relief from Meds: 5  Mobility walk without assistance how many minutes can you walk? 10 ability to climb steps?  yes do you drive?  yes  Function disabled: date disabled .  Neuro/Psych weakness numbness  Prior Studies Any changes since last visit?  no  Physicians involved in your care Any changes since last visit?  no   Family History  Problem Relation Age of Onset  . Liver cancer Mother   . Diabetes Father   . Liver cancer Brother   . Heart attack Brother 51  . Heart attack Sister 57   Social History   Socioeconomic History  . Marital status: Married    Spouse name: Not on file  . Number of children: 3  . Years of education: Not on file  . Highest education level: Not on file  Occupational History  . Occupation: Unemployed  Social Needs  . Financial resource strain: Not on file  . Food insecurity:    Worry: Not on file    Inability: Not on file  . Transportation needs:    Medical: Not on file    Non-medical: Not on file  Tobacco Use  . Smoking status: Current Some Day Smoker    Packs/day: 0.50    Types: Cigarettes  . Smokeless tobacco: Never Used    Substance and Sexual Activity  . Alcohol use: No    Alcohol/week: 0.0 standard drinks  . Drug use: No  . Sexual activity: Not on file  Lifestyle  . Physical activity:    Days per week: Not on file    Minutes per session: Not on file  . Stress: Not on file  Relationships  . Social connections:    Talks on phone: Not on file    Gets together: Not on file    Attends religious service: Not on file    Active member of club or organization: Not on file    Attends meetings of clubs or organizations: Not on file    Relationship status: Not on file  Other Topics Concern  . Not on file  Social History Narrative   Some caffeine use    Past Surgical History:  Procedure Laterality Date  . HERNIA REPAIR     laparoscopic ventral   . SPLENECTOMY, TOTAL     Past Medical History:  Diagnosis Date  . Cervical radiculopathy   . GERD (gastroesophageal reflux disease)   . Hairy cell leukemia ([place]) 2005    Last chemo 2006  . Hepatitis B infection   . Incisional hernia   . Lumbar radiculopathy   . Memory  impairment   . Myopathy    BP 122/83 (BP Location: Left Arm, Patient Position: Sitting, Cuff Size: Normal)   Pulse 64   Resp 14   Ht 5\' 6"  (1.676 m)   Wt 178 lb (80.7 kg)   SpO2 98%   BMI 28.73 kg/m   Opioid Risk Score:   Fall Risk Score:  `1  Depression screen PHQ 2/9  Depression screen [facility] 2/9 04/08/2016 10/06/2015  Decreased Interest 3 3  Down, Depressed, Hopeless 0 0  PHQ - 2 Score 3 3  Altered sleeping - 0  Tired, decreased energy - 1  Change in appetite - 1  Feeling bad or failure about yourself  - 1  Trouble concentrating - 1  Moving slowly or fidgety/restless - 0  Suicidal thoughts - 0  PHQ-9 Score - 7    Review of Systems  Constitutional: Negative.   HENT: Negative.   Eyes: Negative.   Respiratory: Negative.   Gastrointestinal: Negative.   Endocrine: Negative.   Genitourinary: Negative.   Musculoskeletal: Negative.   Skin: Negative.   Neurological:  Positive for weakness and numbness.  Psychiatric/Behavioral: Negative.   All other systems reviewed and are negative.      Objective:   Physical Exam  Constitutional: He is oriented to person, place, and time. He appears well-developed and well-nourished.  HENT:  Head: Normocephalic and atraumatic.  Neck: Normal range of motion. Neck supple.  Cardiovascular: Normal rate and regular rhythm.  Pulmonary/Chest: Effort normal and breath sounds normal.  Musculoskeletal:  Normal Muscle Bulk and Muscle Testing Reveals: Upper Extremities: Full ROM and Muscle Strength 5/5 Back without spinal Tenderness  Lower Extremities: Full ROM and Muscle Strength 5/5 Arises from chair with ease Narrow Based Gait  Neurological: He is alert and oriented to person, place, and time.  Skin: Skin is warm and dry.  Psychiatric: He has a normal mood and affect. His behavior is normal.  Nursing note and vitals reviewed.         Assessment & Plan:  1. Lumbar Degenerative Disc with Lumbar Radiculitis: Continue HEP as Tolerated.  Refilled: Tramadol 50 mg take two tablets twice a day as needed for pain #120.   Return in 5 months

## 2018-03-03 LAB — TOXASSURE SELECT,+ANTIDEPR,UR

## 2018-03-09 ENCOUNTER — Telehealth: Payer: Self-pay | Admitting: *Deleted

## 2018-03-09 NOTE — Telephone Encounter (Signed)
Urine drug screen for this encounter is consistent for prescribed medication 

## 2018-04-27 ENCOUNTER — Other Ambulatory Visit (HOSPITAL_COMMUNITY): Payer: Self-pay | Admitting: Gastroenterology

## 2018-04-27 DIAGNOSIS — R748 Abnormal levels of other serum enzymes: Secondary | ICD-10-CM

## 2018-04-27 DIAGNOSIS — B181 Chronic viral hepatitis B without delta-agent: Secondary | ICD-10-CM

## 2018-05-08 ENCOUNTER — Other Ambulatory Visit (HOSPITAL_COMMUNITY): Payer: Self-pay | Admitting: Gastroenterology

## 2018-05-08 DIAGNOSIS — R748 Abnormal levels of other serum enzymes: Secondary | ICD-10-CM

## 2018-05-08 DIAGNOSIS — B181 Chronic viral hepatitis B without delta-agent: Secondary | ICD-10-CM

## 2018-05-09 ENCOUNTER — Other Ambulatory Visit (HOSPITAL_COMMUNITY): Payer: Self-pay | Admitting: Gastroenterology

## 2018-05-09 DIAGNOSIS — B1910 Unspecified viral hepatitis B without hepatic coma: Secondary | ICD-10-CM

## 2018-05-09 DIAGNOSIS — K7460 Unspecified cirrhosis of liver: Secondary | ICD-10-CM

## 2018-05-10 ENCOUNTER — Other Ambulatory Visit (HOSPITAL_COMMUNITY): Payer: Self-pay | Admitting: Gastroenterology

## 2018-05-10 DIAGNOSIS — K7469 Other cirrhosis of liver: Secondary | ICD-10-CM

## 2018-05-12 ENCOUNTER — Ambulatory Visit (HOSPITAL_COMMUNITY)
Admission: RE | Admit: 2018-05-12 | Discharge: 2018-05-12 | Disposition: A | Discharge status: Home / Self Care | Payer: Medicare Other | Source: Ambulatory Visit | Attending: Gastroenterology | Admitting: Gastroenterology

## 2018-05-12 DIAGNOSIS — K7460 Unspecified cirrhosis of liver: Secondary | ICD-10-CM | POA: Diagnosis not present

## 2018-05-12 DIAGNOSIS — N281 Cyst of kidney, acquired: Secondary | ICD-10-CM | POA: Insufficient documentation

## 2018-05-12 DIAGNOSIS — K7469 Other cirrhosis of liver: Secondary | ICD-10-CM

## 2018-08-15 ENCOUNTER — Other Ambulatory Visit: Payer: Self-pay | Admitting: Registered Nurse

## 2018-08-16 ENCOUNTER — Telehealth: Payer: Self-pay | Admitting: *Deleted

## 2018-08-16 ENCOUNTER — Telehealth: Payer: Self-pay

## 2018-08-16 NOTE — Telephone Encounter (Signed)
[name] called stating he has an appt with Dr Letta Pate on 08/25/18 but he needs a refill on his tramadol now.  He last filled on 07/15/18 according to PMP so this is appropriate. A refill was sent to pharmacy on 08/15/18 and he must not have been alerted. I called and let him know it is at the pharmacy.

## 2018-08-16 NOTE — Telephone Encounter (Signed)
[facility] pharmacy called stating that Tramadol prescription was accidentally deleted and needed to have it called back in. Called into [place]. While I was putting in message the pt called and let him know what happen and to call the pharmacy.

## 2018-08-21 DIAGNOSIS — K7460 Unspecified cirrhosis of liver: Secondary | ICD-10-CM | POA: Diagnosis not present

## 2018-08-21 DIAGNOSIS — B181 Chronic viral hepatitis B without delta-agent: Secondary | ICD-10-CM | POA: Diagnosis not present

## 2018-08-25 ENCOUNTER — Encounter: Payer: Medicare Other | Attending: Registered Nurse

## 2018-08-25 ENCOUNTER — Ambulatory Visit (HOSPITAL_BASED_OUTPATIENT_CLINIC_OR_DEPARTMENT_OTHER): Payer: Medicare Other | Admitting: Physical Medicine & Rehabilitation

## 2018-08-25 ENCOUNTER — Other Ambulatory Visit: Payer: Self-pay

## 2018-08-25 ENCOUNTER — Encounter: Payer: Self-pay | Admitting: Physical Medicine & Rehabilitation

## 2018-08-25 VITALS — BP 121/84 | HR 63 | Ht 68.0 in | Wt 188.0 lb

## 2018-08-25 DIAGNOSIS — M48061 Spinal stenosis, lumbar region without neurogenic claudication: Secondary | ICD-10-CM

## 2018-08-25 DIAGNOSIS — G8929 Other chronic pain: Secondary | ICD-10-CM | POA: Diagnosis not present

## 2018-08-25 DIAGNOSIS — M5416 Radiculopathy, lumbar region: Secondary | ICD-10-CM | POA: Diagnosis not present

## 2018-08-25 DIAGNOSIS — K219 Gastro-esophageal reflux disease without esophagitis: Secondary | ICD-10-CM | POA: Diagnosis not present

## 2018-08-25 DIAGNOSIS — Z79899 Other long term (current) drug therapy: Secondary | ICD-10-CM

## 2018-08-25 DIAGNOSIS — Z760 Encounter for issue of repeat prescription: Secondary | ICD-10-CM | POA: Diagnosis not present

## 2018-08-25 DIAGNOSIS — M5116 Intervertebral disc disorders with radiculopathy, lumbar region: Secondary | ICD-10-CM | POA: Diagnosis not present

## 2018-08-25 DIAGNOSIS — F17210 Nicotine dependence, cigarettes, uncomplicated: Secondary | ICD-10-CM | POA: Diagnosis not present

## 2018-08-25 MED ORDER — TRAMADOL HCL 50 MG PO TABS: ORAL_TABLET | ORAL | 5 refills | Status: DC

## 2018-08-25 NOTE — Progress Notes (Signed)
Subjective:    Patient ID: [name], male    DOB: June 23, 1955, 63 y.o.   MRN: [id number]  HPI  Retired .  Has a garden.  Has 2 teenagers and a young adult at home Primarily with Right sided low back pain  PMH Hepatitis B , pt has hepatologist  And says he oes not have liver damage Pain Inventory Average Pain 5 Pain Right Now 3 My pain is sharp  In the last 24 hours, has pain interfered with the following? General activity 4 Relation with others 3 Enjoyment of life 5 What TIME of day is your pain at its worst? evening Sleep (in general) Fair  Pain is worse with: walking, bending and standing Pain improves with: medication Relief from Meds: 5  Mobility walk without assistance ability to climb steps?  no do you drive?  yes  Function not employed: date last employed .  Neuro/Psych No problems in this area  Prior Studies Any changes since last visit?  no  Physicians involved in your care Any changes since last visit?  no   Family History  Problem Relation Age of Onset  . Liver cancer Mother   . Diabetes Father   . Liver cancer Brother   . Heart attack Brother 63  . Heart attack Sister 42   Social History   Socioeconomic History  . Marital status: Married    Spouse name: Not on file  . Number of children: 3  . Years of education: Not on file  . Highest education level: Not on file  Occupational History  . Occupation: Unemployed  Social Needs  . Financial resource strain: Not on file  . Food insecurity:    Worry: Not on file    Inability: Not on file  . Transportation needs:    Medical: Not on file    Non-medical: Not on file  Tobacco Use  . Smoking status: Current Some Day Smoker    Packs/day: 0.50    Types: Cigarettes  . Smokeless tobacco: Never Used  Substance and Sexual Activity  . Alcohol use: No    Alcohol/week: 0.0 standard drinks  . Drug use: No  . Sexual activity: Not on file  Lifestyle  . Physical activity:    Days per  week: Not on file    Minutes per session: Not on file  . Stress: Not on file  Relationships  . Social connections:    Talks on phone: Not on file    Gets together: Not on file    Attends religious service: Not on file    Active member of club or organization: Not on file    Attends meetings of clubs or organizations: Not on file    Relationship status: Not on file  Other Topics Concern  . Not on file  Social History Narrative   Some caffeine use    Past Surgical History:  Procedure Laterality Date  . HERNIA REPAIR     laparoscopic ventral   . SPLENECTOMY, TOTAL     Past Medical History:  Diagnosis Date  . Cervical radiculopathy   . GERD (gastroesophageal reflux disease)   . Hairy cell leukemia ([place]) 2005    Last chemo 2006  . Hepatitis B infection   . Incisional hernia   . Lumbar radiculopathy   . Memory impairment   . Myopathy    BP 121/84   Pulse 63   Ht 5\' 8"  (1.727 m)   Wt 188 lb (85.3 kg)  SpO2 97%   BMI 28.59 kg/m   Opioid Risk Score:   Fall Risk Score:  `1  Depression screen PHQ 2/9  Depression screen [facility] 2/9 04/08/2016 10/06/2015  Decreased Interest 3 3  Down, Depressed, Hopeless 0 0  PHQ - 2 Score 3 3  Altered sleeping - 0  Tired, decreased energy - 1  Change in appetite - 1  Feeling bad or failure about yourself  - 1  Trouble concentrating - 1  Moving slowly or fidgety/restless - 0  Suicidal thoughts - 0  PHQ-9 Score - 7     Review of Systems  Constitutional: Negative.   HENT: Negative.   Eyes: Negative.   Respiratory: Negative.   Cardiovascular: Negative.   Gastrointestinal: Negative.   Endocrine: Negative.   Genitourinary: Negative.   Musculoskeletal: Positive for back pain.  Skin: Negative.   Allergic/Immunologic: Negative.   Neurological: Negative.   Hematological: Negative.   Psychiatric/Behavioral: Negative.   All other systems reviewed and are negative.      Objective:   Physical Exam  No acute distress Mood and  affect are appropriate Tone in the upper and lower limbs is normal Motor strength is 5/5 bilateral hip flexor knee extensor ankle dorsiflexor Deep tendon reflexes are 1+ bilateral knees 0 right ankle 2+ left ankle Negative straight leg raising Ambulates without assistive device no evidence of toe drag or knee instability       Assessment & Plan:  1.  Lumbar spinal stenosis with chronic lower lumbar radiculopathy on the right no pain in that area does have decreased reflexes.  No motor strength deficits. Overall he is doing quite well on his current medication regimen.  His last UDS was in September 2019 and was appropriate Will repeat today and if this is consistent will not need any further random screening this year PDM P reviewed appropriate  MD f/u prn

## 2018-08-30 LAB — TOXASSURE SELECT 13 (MW), URINE

## 2018-08-31 ENCOUNTER — Telehealth: Payer: Self-pay | Admitting: *Deleted

## 2018-08-31 NOTE — Telephone Encounter (Signed)
Urine drug screen for this encounter is consistent for prescribed medication 

## 2018-09-25 DIAGNOSIS — K7469 Other cirrhosis of liver: Secondary | ICD-10-CM | POA: Diagnosis not present

## 2018-09-25 DIAGNOSIS — B181 Chronic viral hepatitis B without delta-agent: Secondary | ICD-10-CM | POA: Diagnosis not present

## 2018-09-25 DIAGNOSIS — C9141 Hairy cell leukemia, in remission: Secondary | ICD-10-CM | POA: Diagnosis not present

## 2018-11-16 DIAGNOSIS — Z1211 Encounter for screening for malignant neoplasm of colon: Secondary | ICD-10-CM | POA: Diagnosis not present

## 2018-11-16 DIAGNOSIS — Z1212 Encounter for screening for malignant neoplasm of rectum: Secondary | ICD-10-CM | POA: Diagnosis not present

## 2018-12-18 ENCOUNTER — Other Ambulatory Visit: Payer: Self-pay | Admitting: Physical Medicine & Rehabilitation

## 2019-01-04 DIAGNOSIS — R002 Palpitations: Secondary | ICD-10-CM | POA: Diagnosis not present

## 2019-01-04 DIAGNOSIS — Z13220 Encounter for screening for lipoid disorders: Secondary | ICD-10-CM | POA: Diagnosis not present

## 2019-01-04 DIAGNOSIS — F17200 Nicotine dependence, unspecified, uncomplicated: Secondary | ICD-10-CM | POA: Diagnosis not present

## 2019-01-04 DIAGNOSIS — R0789 Other chest pain: Secondary | ICD-10-CM | POA: Diagnosis not present

## 2019-01-08 ENCOUNTER — Ambulatory Visit (INDEPENDENT_AMBULATORY_CARE_PROVIDER_SITE_OTHER): Payer: Medicare Other | Admitting: Cardiology

## 2019-01-08 ENCOUNTER — Other Ambulatory Visit: Payer: Self-pay

## 2019-01-08 ENCOUNTER — Encounter: Payer: Self-pay | Admitting: Cardiology

## 2019-01-08 VITALS — BP 110/70 | HR 64 | Ht 69.0 in | Wt 184.0 lb

## 2019-01-08 DIAGNOSIS — F17210 Nicotine dependence, cigarettes, uncomplicated: Secondary | ICD-10-CM

## 2019-01-08 DIAGNOSIS — R079 Chest pain, unspecified: Secondary | ICD-10-CM | POA: Diagnosis not present

## 2019-01-08 DIAGNOSIS — B181 Chronic viral hepatitis B without delta-agent: Secondary | ICD-10-CM | POA: Diagnosis not present

## 2019-01-08 DIAGNOSIS — R0789 Other chest pain: Secondary | ICD-10-CM | POA: Diagnosis not present

## 2019-01-08 NOTE — Progress Notes (Signed)
Cardiology Office Note:    Date:  01/08/2019   ID:  [name], DOB 12/10/1955, MRN [id number]  PCP:  Marty Heck, MD  Cardiologist:  Jenean Lindau, MD   Referring MD: Marda Stalker, PA-C    ASSESSMENT:    1. Chest discomfort   2. Chronic hepatitis B virus infection ([place])   3. Cigarette smoker    PLAN:    In order of problems listed above:  1. Chest discomfort: The symptoms appear to be atypical however in view of multiple risk factors including smoking, we will schedule the patient for a Lexiscan sestamibi.  Echocardiogram will be done to assess murmur heard on auscultation. 2. And was told to go to the nearest emergency room for any significant concerns and he vocalized understanding. 3. Cigarette smoking: I spent 5 minutes with the patient discussing solely about smoking. Smoking cessation was counseled. I suggested to the patient also different medications and pharmacological interventions. Patient is keen to try stopping on its own at this time. He will get back to me if he needs any further assistance in this matter. 4. Further recommendations will be made based on the findings of the aforementioned test.  Patient will be seen in follow-up appointment in his any concerns.   Medication Adjustments/Labs and Tests Ordered: Current medicines are reviewed at length with the patient today.  Concerns regarding medicines are outlined above.  No orders of the defined types were placed in this encounter.  No orders of the defined types were placed in this encounter.    History of Present Illness:    [name] is a 63 y.o. male who is being seen today for the evaluation of chest discomfort at the request of Marda Stalker, [place].  Patient is a pleasant gentleman.  He has past medical history of hairy cell leukemia and hepatitis B infection.  The patient mentions to me that he is referred here for chest discomfort.  I do not have any records at this time and  will request for the same.  That his chest discomfort occurs sporadically.  It is not related to exertion.  He leads a sedentary lifestyle.  No orthopnea or PND.  No radiation to any part of the body.  With sexual activity his symptoms do not recur.  Unfortunately he is woke up and smoke since the past many years.  At the time of my evaluation, the patient is alert awake oriented and in no distress.  Past Medical History:  Diagnosis Date   Cervical radiculopathy    GERD (gastroesophageal reflux disease)    Hairy cell leukemia ([place]) 2005    Last chemo 2006   Hepatitis B infection    Incisional hernia    Lumbar radiculopathy    Memory impairment    Myopathy     Past Surgical History:  Procedure Laterality Date   HERNIA REPAIR     laparoscopic ventral    SPLENECTOMY, TOTAL      Current Medications: Current Meds  Medication Sig   traMADol (ULTRAM) 50 MG tablet TAKE 2 TABLETS(100 MG) BY MOUTH TWICE DAILY   VIREAD 300 MG tablet Take 300 mg by mouth daily.      Allergies:   Metrizamide and Iodinated diagnostic agents   Social History   Socioeconomic History   Marital status: Married    Spouse name: Not on file   Number of children: 3   Years of education: Not on file   Highest education level: Not  on file  Occupational History   Occupation: Unemployed  Scientist, product/process development strain: Not on file   Food insecurity    Worry: Not on file    Inability: Not on file   Transportation needs    Medical: Not on file    Non-medical: Not on file  Tobacco Use   Smoking status: Current Some Day Smoker    Packs/day: 0.50    Types: Cigarettes   Smokeless tobacco: Never Used  Substance and Sexual Activity   Alcohol use: No    Alcohol/week: 0.0 standard drinks   Drug use: No   Sexual activity: Not on file  Lifestyle   Physical activity    Days per week: Not on file    Minutes per session: Not on file   Stress: Not on file  Relationships    Social connections    Talks on phone: Not on file    Gets together: Not on file    Attends religious service: Not on file    Active member of club or organization: Not on file    Attends meetings of clubs or organizations: Not on file    Relationship status: Not on file  Other Topics Concern   Not on file  Social History Narrative   Some caffeine use      Family History: The patient's family history includes Diabetes in his father; Heart attack (age of onset: 52) in his brother and sister; Liver cancer in his brother and mother.  ROS:   Please see the history of present illness.    All other systems reviewed and are negative.  EKGs/Labs/Other Studies Reviewed:    The following studies were reviewed today: EKG reveals sinus rhythm and nonspecific ST-T changes   Recent Labs: No results found for requested labs within last 8760 hours.  Recent Lipid Panel No results found for: CHOL, TRIG, HDL, CHOLHDL, VLDL, LDLCALC, LDLDIRECT  Physical Exam:    VS:  BP 110/70    Pulse 64    Ht 5\' 9"  (1.753 m)    Wt 184 lb (83.5 kg)    SpO2 97%    BMI 27.17 kg/m     Wt Readings from Last 3 Encounters:  01/08/19 184 lb (83.5 kg)  08/25/18 188 lb (85.3 kg)  02/24/18 178 lb (80.7 kg)     GEN: Patient is in no acute distress HEENT: Normal NECK: No JVD; No carotid bruits LYMPHATICS: No lymphadenopathy CARDIAC: S1 S2 regular, 2/6 systolic murmur at the apex. RESPIRATORY:  Clear to auscultation without rales, wheezing or rhonchi  ABDOMEN: Soft, non-tender, non-distended MUSCULOSKELETAL:  No edema; No deformity  SKIN: Warm and dry NEUROLOGIC:  Alert and oriented x 3 PSYCHIATRIC:  Normal affect    Signed, Jenean Lindau, MD  01/08/2019 8:59 AM    [place]

## 2019-01-08 NOTE — Patient Instructions (Signed)
Medication Instructions:  Your physician recommends that you continue on your current medications as directed. Please refer to the Current Medication list given to you today. If you need a refill on your cardiac medications before your next appointment, please call your pharmacy.   Lab work: NONE  If you have labs (blood work) drawn today and your tests are completely normal, you will receive your results only by:  [name] (if you have MyChart) OR  A paper copy in the mail If you have any lab test that is abnormal or we need to change your treatment, we will call you to review the results.  Testing/Procedures: Your physician has requested that you have a lexiscan myoview. For further information please visit [email or web address]. Please follow instruction sheet, as given.  Your physician has requested that you have an echocardiogram. Echocardiography is a painless test that uses sound waves to create images of your heart. It provides your doctor with information about the size and shape of your heart and how well your hearts chambers and valves are working. This procedure takes approximately one hour. There are no restrictions for this procedure.    Follow-Up: At [facility], you and your health needs are our priority.  As part of our continuing mission to provide you with exceptional heart care, we have created designated Provider Care Teams.  These Care Teams include your primary Cardiologist (physician) and Advanced Practice Providers (APPs -  Physician Assistants and Nurse Practitioners) who all work together to provide you with the care you need, when you need it. You will need a follow up appointment in 6 months.  Please call our office 2 months in advance to schedule this appointment.   Any Other Special Instructions Will Be Listed Below (If Applicable). NONE  Echocardiogram An echocardiogram is a procedure that uses painless sound waves (ultrasound) to produce an image  of the heart. Images from an echocardiogram can provide important information about:  Signs of coronary artery disease (CAD).  Aneurysm detection. An aneurysm is a weak or damaged part of an artery wall that bulges out from the normal force of blood pumping through the body.  Heart size and shape. Changes in the size or shape of the heart can be associated with certain conditions, including heart failure, aneurysm, and CAD.  Heart muscle function.  Heart valve function.  Signs of a past heart attack.  Fluid buildup around the heart.  Thickening of the heart muscle.  A tumor or infectious growth around the heart valves. Tell a health care provider about:  Any allergies you have.  All medicines you are taking, including vitamins, herbs, eye drops, creams, and over-the-counter medicines.  Any blood disorders you have.  Any surgeries you have had.  Any medical conditions you have.  Whether you are pregnant or may be pregnant. What are the risks? Generally, this is a safe procedure. However, problems may occur, including:  Allergic reaction to dye (contrast) that may be used during the procedure. What happens before the procedure? No specific preparation is needed. You may eat and drink normally. What happens during the procedure?   An IV tube may be inserted into one of your veins.  You may receive contrast through this tube. A contrast is an injection that improves the quality of the pictures from your heart.  A gel will be applied to your chest.  A wand-like tool (transducer) will be moved over your chest. The gel will help to transmit the sound waves from  the transducer.  The sound waves will harmlessly bounce off of your heart to allow the heart images to be captured in real-time motion. The images will be recorded on a computer. The procedure may vary among health care providers and hospitals. What happens after the procedure?  You may return to your normal,  everyday life, including diet, activities, and medicines, unless your health care provider tells you not to do that. Summary  An echocardiogram is a procedure that uses painless sound waves (ultrasound) to produce an image of the heart.  Images from an echocardiogram can provide important information about the size and shape of your heart, heart muscle function, heart valve function, and fluid buildup around your heart.  You do not need to do anything to prepare before this procedure. You may eat and drink normally.  After the echocardiogram is completed, you may return to your normal, everyday life, unless your health care provider tells you not to do that. This information is not intended to replace advice given to you by your health care provider. Make sure you discuss any questions you have with your health care provider. Document Released: 05/21/2000 Document Revised: 09/14/2018 Document Reviewed: 06/26/2016 Elsevier Patient Education  2020 [employer].  Cardiac Nuclear Scan A cardiac nuclear scan is a test that measures blood flow to the heart when a person is resting and when he or she is exercising. The test looks for problems such as:  Not enough blood reaching a portion of the heart.  The heart muscle not working normally. You may need this test if:  You have heart disease.  You have had abnormal lab results.  You have had heart surgery or a balloon procedure to open up blocked arteries (angioplasty).  You have chest pain.  You have shortness of breath. In this test, a radioactive dye (tracer) is injected into your bloodstream. After the tracer has traveled to your heart, an imaging device is used to measure how much of the tracer is absorbed by or distributed to various areas of your heart. This procedure is usually done at a hospital and takes 2-4 hours. Tell a health care provider about:  Any allergies you have.  All medicines you are taking, including vitamins,  herbs, eye drops, creams, and over-the-counter medicines.  Any problems you or family members have had with anesthetic medicines.  Any blood disorders you have.  Any surgeries you have had.  Any medical conditions you have.  Whether you are pregnant or may be pregnant. What are the risks? Generally, this is a safe procedure. However, problems may occur, including:  Serious chest pain and heart attack. This is only a risk if the stress portion of the test is done.  Rapid heartbeat.  Sensation of warmth in your chest. This usually passes quickly.  Allergic reaction to the tracer. What happens before the procedure?  Ask your health care provider about changing or stopping your regular medicines. This is especially important if you are taking diabetes medicines or blood thinners.  Follow instructions from your health care provider about eating or drinking restrictions.  Remove your jewelry on the day of the procedure. What happens during the procedure?  An IV will be inserted into one of your veins.  Your health care provider will inject a small amount of radioactive tracer through the IV.  You will wait for 20-40 minutes while the tracer travels through your bloodstream.  Your heart activity will be monitored with an electrocardiogram (ECG).  You  will lie down on an exam table.  Images of your heart will be taken for about 15-20 minutes.  You may also have a stress test. For this test, one of the following may be done: ? You will exercise on a treadmill or stationary bike. While you exercise, your heart's activity will be monitored with an ECG, and your blood pressure will be checked. ? You will be given medicines that will increase blood flow to parts of your heart. This is done if you are unable to exercise.  When blood flow to your heart has peaked, a tracer will again be injected through the IV.  After 20-40 minutes, you will get back on the exam table and have more  images taken of your heart.  Depending on the type of tracer used, scans may need to be repeated 3-4 hours later.  Your IV line will be removed when the procedure is over. The procedure may vary among health care providers and hospitals. What happens after the procedure?  Unless your health care provider tells you otherwise, you may return to your normal schedule, including diet, activities, and medicines.  Unless your health care provider tells you otherwise, you may increase your fluid intake. This will help to flush the contrast dye from your body. Drink enough fluid to keep your urine pale yellow.  Ask your health care provider, or the department that is doing the test: ? When will my results be ready? ? How will I get my results? Summary  A cardiac nuclear scan measures the blood flow to the heart when a person is resting and when he or she is exercising.  Tell your health care provider if you are pregnant.  Before the procedure, ask your health care provider about changing or stopping your regular medicines. This is especially important if you are taking diabetes medicines or blood thinners.  After the procedure, unless your health care provider tells you otherwise, increase your fluid intake. This will help flush the contrast dye from your body.  After the procedure, unless your health care provider tells you otherwise, you may return to your normal schedule, including diet, activities, and medicines. This information is not intended to replace advice given to you by your health care provider. Make sure you discuss any questions you have with your health care provider. Document Released: 06/18/2004 Document Revised: 11/07/2017 Document Reviewed: 11/07/2017 Elsevier Patient Education  2020 [employer].

## 2019-01-10 DIAGNOSIS — K7460 Unspecified cirrhosis of liver: Secondary | ICD-10-CM | POA: Diagnosis not present

## 2019-01-10 DIAGNOSIS — K7469 Other cirrhosis of liver: Secondary | ICD-10-CM | POA: Diagnosis not present

## 2019-01-10 DIAGNOSIS — N281 Cyst of kidney, acquired: Secondary | ICD-10-CM | POA: Diagnosis not present

## 2019-01-12 ENCOUNTER — Encounter (HOSPITAL_COMMUNITY): Payer: Medicare Other

## 2019-01-15 ENCOUNTER — Other Ambulatory Visit: Payer: Self-pay

## 2019-01-15 ENCOUNTER — Ambulatory Visit (HOSPITAL_BASED_OUTPATIENT_CLINIC_OR_DEPARTMENT_OTHER)
Admission: RE | Admit: 2019-01-15 | Discharge: 2019-01-15 | Disposition: A | Discharge status: Home / Self Care | Payer: Medicare Other | Source: Ambulatory Visit | Attending: Cardiology | Admitting: Cardiology

## 2019-01-15 DIAGNOSIS — R079 Chest pain, unspecified: Secondary | ICD-10-CM | POA: Diagnosis not present

## 2019-01-15 DIAGNOSIS — R0789 Other chest pain: Secondary | ICD-10-CM | POA: Diagnosis not present

## 2019-01-15 NOTE — Progress Notes (Signed)
  Echocardiogram 2D Echocardiogram has been performed.  Cardell Peach 01/15/2019, 10:40 AM

## 2019-01-18 ENCOUNTER — Encounter: Payer: Self-pay | Admitting: *Deleted

## 2019-01-18 ENCOUNTER — Telehealth: Payer: Self-pay | Admitting: Cardiology

## 2019-01-18 ENCOUNTER — Telehealth: Payer: Self-pay | Admitting: *Deleted

## 2019-01-18 NOTE — Telephone Encounter (Signed)
-----   Message from Jenean Lindau, MD sent at 01/17/2019 10:15 PM EDT ----- The results of the study is unremarkable. Please inform patient. I will discuss in detail at next appointment. Cc  primary care/referring physician Jenean Lindau, MD 01/17/2019 10:15 PM

## 2019-01-18 NOTE — Telephone Encounter (Signed)
Telephone call to patient. Left message to call back regarding echo results.COpy sent to PCP

## 2019-01-19 NOTE — Telephone Encounter (Signed)
Telephone call to patient .Informed of unremarkable echo results.

## 2019-02-26 ENCOUNTER — Encounter: Payer: Self-pay | Admitting: Registered Nurse

## 2019-02-26 ENCOUNTER — Encounter: Payer: Medicare Other | Attending: Registered Nurse | Admitting: Registered Nurse

## 2019-02-26 ENCOUNTER — Other Ambulatory Visit: Payer: Self-pay

## 2019-02-26 VITALS — BP 145/86 | HR 63 | Temp 97.3°F | Resp 14 | Ht 68.0 in | Wt 184.0 lb

## 2019-02-26 DIAGNOSIS — M25511 Pain in right shoulder: Secondary | ICD-10-CM | POA: Diagnosis not present

## 2019-02-26 DIAGNOSIS — M48061 Spinal stenosis, lumbar region without neurogenic claudication: Secondary | ICD-10-CM

## 2019-02-26 DIAGNOSIS — G8929 Other chronic pain: Secondary | ICD-10-CM

## 2019-02-26 DIAGNOSIS — M5416 Radiculopathy, lumbar region: Secondary | ICD-10-CM

## 2019-02-26 NOTE — Progress Notes (Signed)
Subjective:    Patient ID: [name], male    DOB: [birth year], 63 y.o.   MRN: [id number]  HPI: [name] is a 63 y.o. male who returns for follow up appointment for chronic pain and medication refill. He states his pain is located in his right shoulder and lower back pain radiating into his right lower extremity and right foot pain. He rates his pain 2. His current exercise regime is walking and performing stretching exercises.  [name] forgot his tramadol medication bottle today, he also forgot his medication last office visit on 08/25/2018 and was educated to bring his medication with him on his next visit. He also forgot his medication on 02/24/2018. He was given a copy of the Narcotic Contract today and was instructed to bring his medication to office. No prescription was given today. He verbalizes understanding. PMP was reviewed last prescription was filled on 02/18/2019.   [name] Morphine equivalent is 20.00 MME. Last UDS was Performed on 08/25/2018, it was consistent.     Pain Inventory Average Pain 5 Pain Right Now 2 My pain is sharp  In the last 24 hours, has pain interfered with the following? General activity 4 Relation with others 5 Enjoyment of life 6 What TIME of day is your pain at its worst? daytime, evening Sleep (in general) Fair  Pain is worse with: bending and sitting Pain improves with: medication Relief from Meds: 8  Mobility walk without assistance how many minutes can you walk? 10 ability to climb steps?  yes do you drive?  yes  Function not employed: date last employed . disabled: date disabled .  Neuro/Psych No problems in this area  Prior Studies Any changes since last visit?  no  Physicians involved in your care Any changes since last visit?  no   Family History  Problem Relation Age of Onset  . Liver cancer Mother   . Diabetes Father   . Liver cancer Brother   . Heart attack Brother 60  . Heart attack  Sister 80   Social History   Socioeconomic History  . Marital status: Married    Spouse name: Not on file  . Number of children: 3  . Years of education: Not on file  . Highest education level: Not on file  Occupational History  . Occupation: Unemployed  Social Needs  . Financial resource strain: Not on file  . Food insecurity    Worry: Not on file    Inability: Not on file  . Transportation needs    Medical: Not on file    Non-medical: Not on file  Tobacco Use  . Smoking status: Current Some Day Smoker    Packs/day: 0.50    Types: Cigarettes  . Smokeless tobacco: Never Used  Substance and Sexual Activity  . Alcohol use: No    Alcohol/week: 0.0 standard drinks  . Drug use: No  . Sexual activity: Not on file  Lifestyle  . Physical activity    Days per week: Not on file    Minutes per session: Not on file  . Stress: Not on file  Relationships  . Social Herbalist on phone: Not on file    Gets together: Not on file    Attends religious service: Not on file    Active member of club or organization: Not on file    Attends meetings of clubs or organizations: Not on file    Relationship status: Not on file  Other Topics Concern  . Not on file  Social History Narrative   Some caffeine use    Past Surgical History:  Procedure Laterality Date  . HERNIA REPAIR     laparoscopic ventral   . SPLENECTOMY, TOTAL     Past Medical History:  Diagnosis Date  . Cervical radiculopathy   . GERD (gastroesophageal reflux disease)   . Hairy cell leukemia ([place]) 2005    Last chemo 2006  . Hepatitis B infection   . Incisional hernia   . Lumbar radiculopathy   . Memory impairment   . Myopathy    BP (!) 145/86   Pulse 63   Temp (!) 97.3 F (36.3 C)   Resp 14   Ht 5\' 8"  (1.727 m)   Wt 184 lb (83.5 kg)   SpO2 98%   BMI 27.98 kg/m   Opioid Risk Score:   Fall Risk Score:  `1  Depression screen PHQ 2/9  Depression screen [facility] 2/9 04/08/2016 10/06/2015  Decreased  Interest 3 3  Down, Depressed, Hopeless 0 0  PHQ - 2 Score 3 3  Altered sleeping - 0  Tired, decreased energy - 1  Change in appetite - 1  Feeling bad or failure about yourself  - 1  Trouble concentrating - 1  Moving slowly or fidgety/restless - 0  Suicidal thoughts - 0  PHQ-9 Score - 7    Review of Systems  Constitutional: Negative.   HENT: Negative.   Eyes: Negative.   Respiratory: Negative.   Cardiovascular: Negative.   Gastrointestinal: Negative.   Endocrine: Negative.   Genitourinary: Negative.   Musculoskeletal: Positive for arthralgias and myalgias.  Allergic/Immunologic: Negative.   Neurological: Negative.        Neuropathy  Hematological: Negative.   Psychiatric/Behavioral: Negative.   All other systems reviewed and are negative.      Objective:   Physical Exam Vitals signs and nursing note reviewed.  Constitutional:      Appearance: Normal appearance.  Neck:     Musculoskeletal: Normal range of motion and neck supple.  Cardiovascular:     Rate and Rhythm: Normal rate and regular rhythm.     Pulses: Normal pulses.     Heart sounds: Normal heart sounds.  Pulmonary:     Effort: Pulmonary effort is normal.     Breath sounds: Normal breath sounds.  Musculoskeletal:     Comments: Normal Muscle Bulk and Muscle Testing Reveals:  Upper Extremities: Full ROM and Muscle Strength 5/5 Lumbar Paraspinal Tenderness: L-4-L-5 Mainly Right Side Lower Extremities: Full ROM and Muscle Strength 5/5 Arises from Table with Ease  Narrow Based  Gait   Skin:    General: Skin is warm and dry.  Neurological:     Mental Status: He is alert and oriented to person, place, and time.  Psychiatric:        Mood and Affect: Mood normal.        Behavior: Behavior normal.           Assessment & Plan:  1. Lumbar Degenerative Disc with Lumbar Radiculitis: Continue HEP as Tolerated.  Continue  Tramadol 50 mg take two tablets twice a day as needed for pain #120.  We will continue  the opioid monitoring program, this consists of regular clinic visits, examinations, urine drug screen, pill counts as well as use of [place] Controlled Substance Reporting system. 2. Chronic Right Shoulder Pain: Continue HEP as Tolerated. Continue to Monitor.   Return in 6 months

## 2019-02-27 ENCOUNTER — Telehealth: Payer: Self-pay | Admitting: *Deleted

## 2019-02-27 MED ORDER — TRAMADOL HCL 50 MG PO TABS: ORAL_TABLET | ORAL | 5 refills | Status: DC

## 2019-02-27 NOTE — Telephone Encounter (Signed)
Tramadol e-scribed. PMP was reviewed.

## 2019-02-27 NOTE — Telephone Encounter (Signed)
Patient came into clinic today as requested by provider to have his tramadol counted. His tramadol was refilled on 02/18/2019,    #120,    Verified # 36.  Patient was once again educated to please bring medication to next clinic visit.

## 2019-04-16 ENCOUNTER — Other Ambulatory Visit: Payer: Self-pay | Admitting: Physical Medicine & Rehabilitation

## 2019-06-18 DIAGNOSIS — C9141 Hairy cell leukemia, in remission: Secondary | ICD-10-CM | POA: Diagnosis not present

## 2019-06-18 DIAGNOSIS — E559 Vitamin D deficiency, unspecified: Secondary | ICD-10-CM | POA: Diagnosis not present

## 2019-06-18 DIAGNOSIS — B181 Chronic viral hepatitis B without delta-agent: Secondary | ICD-10-CM | POA: Diagnosis not present

## 2019-06-18 DIAGNOSIS — K7469 Other cirrhosis of liver: Secondary | ICD-10-CM | POA: Diagnosis not present

## 2019-06-20 ENCOUNTER — Other Ambulatory Visit: Payer: Self-pay | Admitting: Gastroenterology

## 2019-06-29 DIAGNOSIS — K7469 Other cirrhosis of liver: Secondary | ICD-10-CM | POA: Diagnosis not present

## 2019-06-29 DIAGNOSIS — N281 Cyst of kidney, acquired: Secondary | ICD-10-CM | POA: Diagnosis not present

## 2019-06-29 DIAGNOSIS — K7689 Other specified diseases of liver: Secondary | ICD-10-CM | POA: Diagnosis not present

## 2019-06-29 DIAGNOSIS — K7460 Unspecified cirrhosis of liver: Secondary | ICD-10-CM | POA: Diagnosis not present

## 2019-07-23 ENCOUNTER — Ambulatory Visit (INDEPENDENT_AMBULATORY_CARE_PROVIDER_SITE_OTHER): Payer: Medicare Other | Admitting: Cardiology

## 2019-07-23 ENCOUNTER — Encounter: Payer: Self-pay | Admitting: Cardiology

## 2019-07-23 ENCOUNTER — Other Ambulatory Visit: Payer: Self-pay

## 2019-07-23 VITALS — BP 106/76 | HR 80 | Ht 68.0 in | Wt 192.0 lb

## 2019-07-23 DIAGNOSIS — R072 Precordial pain: Secondary | ICD-10-CM | POA: Diagnosis not present

## 2019-07-23 DIAGNOSIS — F17210 Nicotine dependence, cigarettes, uncomplicated: Secondary | ICD-10-CM | POA: Diagnosis not present

## 2019-07-23 DIAGNOSIS — R0789 Other chest pain: Secondary | ICD-10-CM | POA: Insufficient documentation

## 2019-07-23 DIAGNOSIS — R0989 Other specified symptoms and signs involving the circulatory and respiratory systems: Secondary | ICD-10-CM

## 2019-07-23 MED ORDER — ASPIRIN EC 81 MG PO TBEC: 81.0000 mg | DELAYED_RELEASE_TABLET | Freq: Every day | ORAL | 3 refills | Status: DC

## 2019-07-23 MED ORDER — NITROGLYCERIN 0.4 MG SL SUBL: 0.4000 mg | SUBLINGUAL_TABLET | SUBLINGUAL | 3 refills | Status: DC | PRN

## 2019-07-23 NOTE — Patient Instructions (Signed)
Medication Instructions:  Your physician has recommended you make the following change in your medication:   Start taking a 81 mg Aspirin daily. Take Nitroglycerin as directed for chest pain/discomfort,  *If you need a refill on your cardiac medications before your next appointment, please call your pharmacy*  Lab Work: None ordered If you have labs (blood work) drawn today and your tests are completely normal, you will receive your results only by: Marland Kitchen MyChart Message (if you have MyChart) OR . A paper copy in the mail If you have any lab test that is abnormal or we need to change your treatment, we will call you to review the results.  Testing/Procedure carotid ultrasound  Your physician has requested that you have a lexiscan myoview. For further information please visit [email or web address]. Please follow instruction sheet, as given.   Follow-Up: At [facility], you and your health needs are our priority.  As part of our continuing mission to provide you with exceptional heart care, we have created designated Provider Care Teams.  These Care Teams include your primary Cardiologist (physician) and Advanced Practice Providers (APPs -  Physician Assistants and Nurse Practitioners) who all work together to provide you with the care you need, when you need it.  Your next appointment:   3 month(s)  The format for your next appointment:   In Person  Provider:   Jyl Heinz, MD  Other Instructions NA

## 2019-07-23 NOTE — Progress Notes (Signed)
Cardiology Office Note:    Date:  07/23/2019   ID:  [name], DOB 1955/09/26, MRN [id number]  PCP:  Marty Heck, MD  Cardiologist:  Jenean Lindau, MD   Referring MD: Marty Heck, MD    ASSESSMENT:    1. Carotid bruit, unspecified laterality   2. Chest discomfort   3. Cigarette smoker    PLAN:    In order of problems listed above:  1. Chest discomfort: Patient is to be evaluated for this.  He has not kept up his stress test appointment and he tells me that he forgot about it.  I discussed with him and compliance was urged.  I wanted to do a CT FFR this time but he tells me he has dye allergy and he is not keen on it therefore we will set him up for a Lexiscan sestamibi.  Meanwhile I also made him the following recommendations.  I advised him to take a coated baby aspirin on a daily basis.  Sublingual nitroglycerin prescription was sent, its protocol and 911 protocol explained and the patient vocalized understanding questions were answered to the patient's satisfaction 2. Cigarette smoker: I spent 5 minutes with the patient discussing solely about smoking. Smoking cessation was counseled. I suggested to the patient also different medications and pharmacological interventions. Patient is keen to try stopping on its own at this time. He will get back to me if he needs any further assistance in this matter. 3. Patient will be seen in follow-up appointment in 3 months or earlier if the patient has any concerns    Medication Adjustments/Labs and Tests Ordered: Current medicines are reviewed at length with the patient today.  Concerns regarding medicines are outlined above.  No orders of the defined types were placed in this encounter.  No orders of the defined types were placed in this encounter.    Chief Complaint  Patient presents with  . Follow-up    6 Months     History of Present Illness:    [name] is a 64 y.o. male.  Patient was evaluated by me  for chest discomfort.  He is a smoker.  He mentions to me that when he exerts he has chest discomfort at times no orthopnea or PND.  No radiation to any part of the body.  At the time of my evaluation, the patient is alert awake oriented and in no distress.  Unfortunately continues to smoke.  Past Medical History:  Diagnosis Date  . Cervical radiculopathy   . GERD (gastroesophageal reflux disease)   . Hairy cell leukemia ([place]) 2005    Last chemo 2006  . Hepatitis B infection   . Incisional hernia   . Lumbar radiculopathy   . Memory impairment   . Myopathy     Past Surgical History:  Procedure Laterality Date  . HERNIA REPAIR     laparoscopic ventral   . SPLENECTOMY, TOTAL      Current Medications: Current Meds  Medication Sig  . traMADol (ULTRAM) 50 MG tablet TAKE 2 TABLETS(100 MG) BY MOUTH TWICE DAILY  . VIREAD 300 MG tablet Take 300 mg by mouth daily.      Allergies:   Metrizamide and Iodinated diagnostic agents   Social History   Socioeconomic History  . Marital status: Married    Spouse name: Not on file  . Number of children: 3  . Years of education: Not on file  . Highest education level: Not on file  Occupational  History  . Occupation: Unemployed  Tobacco Use  . Smoking status: Current Some Day Smoker    Packs/day: 0.50    Types: Cigarettes  . Smokeless tobacco: Never Used  Substance and Sexual Activity  . Alcohol use: No    Alcohol/week: 0.0 standard drinks  . Drug use: No  . Sexual activity: Not on file  Other Topics Concern  . Not on file  Social History Narrative   Some caffeine use    Social Determinants of Health   Financial Resource Strain:   . Difficulty of Paying Living Expenses: Not on file  Food Insecurity:   . Worried About Charity fundraiser in the Last Year: Not on file  . Ran Out of Food in the Last Year: Not on file  Transportation Needs:   . Lack of Transportation (Medical): Not on file  . Lack of Transportation (Non-Medical):  Not on file  Physical Activity:   . Days of Exercise per Week: Not on file  . Minutes of Exercise per Session: Not on file  Stress:   . Feeling of Stress : Not on file  Social Connections:   . Frequency of Communication with Friends and Family: Not on file  . Frequency of Social Gatherings with Friends and Family: Not on file  . Attends Religious Services: Not on file  . Active Member of Clubs or Organizations: Not on file  . Attends Archivist Meetings: Not on file  . Marital Status: Not on file     Family History: The patient's family history includes Diabetes in his father; Heart attack (age of onset: 41) in his brother and sister; Liver cancer in his brother and mother.  ROS:   Please see the history of present illness.    All other systems reviewed and are negative.  EKGs/Labs/Other Studies Reviewed:    The following studies were reviewed today: IMPRESSIONS    1. The left ventricle has normal systolic function with an ejection  fraction of 60-65%. The cavity size was normal. Left ventricular diastolic  Doppler parameters are consistent with impaired relaxation. No evidence of  left ventricular regional wall  motion abnormalities.  2. The right ventricle has normal systolic function. The cavity was  normal. There is no increase in right ventricular wall thickness.  3. No evidence of mitral valve stenosis.  4. The aortic valve is tricuspid. Mild thickening of the aortic valve. No  stenosis of the aortic valve.  5. The aorta is normal in size and structure.  6. The aortic root and ascending aorta are normal in size and structure.  7. The inferior vena cava was normal in size with <50% respiratory  variability.    Recent Labs: No results found for requested labs within last 8760 hours.  Recent Lipid Panel No results found for: CHOL, TRIG, HDL, CHOLHDL, VLDL, LDLCALC, LDLDIRECT  Physical Exam:    VS:  BP 106/76   Pulse 80   Ht 5\' 8"  (1.727 m)    Wt 192 lb (87.1 kg)   SpO2 97%   BMI 29.19 kg/m     Wt Readings from Last 3 Encounters:  07/23/19 192 lb (87.1 kg)  02/26/19 184 lb (83.5 kg)  01/08/19 184 lb (83.5 kg)     GEN: Patient is in no acute distress HEENT: Normal NECK: No JVD; bilateral soft carotid bruits LYMPHATICS: No lymphadenopathy CARDIAC: Hear sounds regular, 2/6 systolic murmur at the apex. RESPIRATORY:  Clear to auscultation without rales, wheezing or rhonchi  ABDOMEN: Soft, non-tender, non-distended MUSCULOSKELETAL:  No edema; No deformity  SKIN: Warm and dry NEUROLOGIC:  Alert and oriented x 3 PSYCHIATRIC:  Normal affect   Signed, Jenean Lindau, MD  07/23/2019 4:24 PM    [facility]

## 2019-07-25 ENCOUNTER — Encounter (HOSPITAL_COMMUNITY): Payer: Medicare Other

## 2019-07-26 ENCOUNTER — Encounter (HOSPITAL_COMMUNITY): Payer: Medicare Other

## 2019-08-01 ENCOUNTER — Telehealth (HOSPITAL_COMMUNITY): Payer: Self-pay | Admitting: *Deleted

## 2019-08-01 NOTE — Telephone Encounter (Signed)
Patient given detailed instructions per Myocardial Perfusion Study Information Sheet for the test on 08/08/2019 at 1000. Patient notified to arrive 15 minutes early and that it is imperative to arrive on time for appointment to keep from having the test rescheduled.  If you need to cancel or reschedule your appointment, please call the office within 24 hours of your appointment. . Patient verbalized understanding.[name], [place] No mychart

## 2019-08-08 ENCOUNTER — Encounter (HOSPITAL_COMMUNITY): Payer: Medicare Other

## 2019-08-08 ENCOUNTER — Ambulatory Visit (HOSPITAL_COMMUNITY): Admission: RE | Admit: 2019-08-08 | Payer: Medicare Other | Source: Ambulatory Visit

## 2019-08-15 ENCOUNTER — Telehealth (HOSPITAL_COMMUNITY): Payer: Self-pay | Admitting: *Deleted

## 2019-08-15 NOTE — Telephone Encounter (Signed)
Patient given detailed instructions per Myocardial Perfusion Study Information Sheet for the test on 08/17/19 at 10:15. Patient notified to arrive 15 minutes early and that it is imperative to arrive on time for appointment to keep from having the test rescheduled.  If you need to cancel or reschedule your appointment, please call the office within 24 hours of your appointment. . Patient verbalized understanding.[name]

## 2019-08-17 ENCOUNTER — Encounter (HOSPITAL_COMMUNITY): Payer: Medicare Other

## 2019-08-27 DIAGNOSIS — R1032 Left lower quadrant pain: Secondary | ICD-10-CM | POA: Diagnosis not present

## 2019-08-30 ENCOUNTER — Encounter: Payer: Medicare Other | Attending: Physical Medicine & Rehabilitation | Admitting: Physical Medicine & Rehabilitation

## 2019-09-10 ENCOUNTER — Telehealth: Payer: Self-pay

## 2019-09-10 NOTE — Telephone Encounter (Signed)
VM left for pt to callback regarding 4th appointment being cancelled for his [place].

## 2019-09-10 NOTE — Telephone Encounter (Signed)
-----   Message from Jenean Lindau, MD sent at 09/10/2019  9:27 AM EDT ----- Regarding: RE: patient has cancelled Myoview Please give him a call and check what is going on and let me know ----- Message ----- From: Truddie Hidden, RN Sent: 09/10/2019   9:20 AM EDT To: Jenean Lindau, MD Subject: FW: patient has cancelled Myoview               ----- Message ----- From: [name] Sent: 09/10/2019   8:45 AM EDT To: Jenean Lindau, MD, Truddie Hidden, RN Subject: patient has cancelled Myoview                  Good Morning! Hope you are doing well!!!  The patient has cancelled the [place] now 4 times : 01/12/19, 07/26/19,08/08/2019 and 08/17/2019.  Would you like me to continue to try to reschedule?

## 2019-09-11 ENCOUNTER — Other Ambulatory Visit: Payer: Self-pay | Admitting: Registered Nurse

## 2019-09-12 NOTE — Telephone Encounter (Signed)
Sybil RN called in his Tramadol prescription. He has an appointment with Dr Letta Pate in April/ 2021.

## 2019-09-13 ENCOUNTER — Telehealth: Payer: Self-pay

## 2019-09-13 NOTE — Telephone Encounter (Signed)
Attempted to call pt in regards to [place] being cancelled. Message left for pt to call back.

## 2019-09-13 NOTE — Telephone Encounter (Signed)
-----   Message from Frederic Jericho sent at 09/13/2019  2:52 PM EDT ----- Regarding: RE: patient has cancelled Myoview Thank you so much! ----- Message ----- From: Truddie Hidden, RN Sent: 09/13/2019  12:16 PM EDT To: Lorin Mercy, MD Subject: RE: patient has cancelled Myoview              I called and left a message but he never returned my call. I will try him again today! Miss you too!! ----- Message ----- From: Jenean Lindau, MD Sent: 09/13/2019  11:18 AM EDT To: Maylene Roes, RN Subject: RE: patient has cancelled Myoview              I will check with [facility]..  She was supposed to call him and check what is the issue with him. ----- Message ----- From: Frederic Jericho Sent: 09/13/2019  11:04 AM EDT To: Jenean Lindau, MD, Truddie Hidden, RN Subject: FW: patient has cancelled Myoview              Just following up on this.. Hope you guys are doing well! Miss yall!!!! (My [place]) :) ----- Message ----- From: Frederic Jericho Sent: 09/10/2019   8:45 AM EDT To: Jenean Lindau, MD, Truddie Hidden, RN Subject: patient has cancelled Myoview                  Good Morning! Hope you are doing well!!!  The patient has cancelled the [place] now 4 times : 01/12/19, 07/26/19,08/08/2019 and 08/17/2019.  Would you like me to continue to try to reschedule?

## 2019-09-20 ENCOUNTER — Telehealth (HOSPITAL_COMMUNITY): Payer: Self-pay | Admitting: Cardiology

## 2019-09-20 NOTE — Telephone Encounter (Signed)
Just an FYI. We have made several attempts to contact this patient including sending a letter to schedule or reschedule their Myocardial Perfusion Study . We will be removing the patient from the echo [place].  09/13/19 Per Truddie Hidden RN she will call patient and double check issues/ [facility] (staff message)  04/05/21Sent staff message to Dr to see if need to reschedule after pt cancelled multiple appts/[facility]  08/17/19 Pt cancelled is sick and will call back later to schedule/[facility]    Thank you

## 2019-09-20 NOTE — Telephone Encounter (Signed)
I agree. Multiple messages left without a callback.

## 2019-09-28 ENCOUNTER — Encounter: Payer: Self-pay | Admitting: Physical Medicine & Rehabilitation

## 2019-09-28 ENCOUNTER — Encounter: Payer: Medicare Other | Attending: Physical Medicine & Rehabilitation | Admitting: Physical Medicine & Rehabilitation

## 2019-09-28 ENCOUNTER — Other Ambulatory Visit: Payer: Self-pay

## 2019-09-28 VITALS — BP 126/84 | HR 66 | Temp 98.2°F | Ht 68.0 in | Wt 187.0 lb

## 2019-09-28 DIAGNOSIS — M5416 Radiculopathy, lumbar region: Secondary | ICD-10-CM | POA: Diagnosis not present

## 2019-09-28 DIAGNOSIS — M48061 Spinal stenosis, lumbar region without neurogenic claudication: Secondary | ICD-10-CM | POA: Diagnosis not present

## 2019-09-28 DIAGNOSIS — G894 Chronic pain syndrome: Secondary | ICD-10-CM | POA: Diagnosis not present

## 2019-09-28 MED ORDER — TRAMADOL HCL 50 MG PO TABS: ORAL_TABLET | ORAL | 5 refills | Status: DC

## 2019-09-28 NOTE — Addendum Note (Signed)
Addended by: Charlett Blake on: 09/28/2019 11:31 AM   Modules accepted: Orders

## 2019-09-28 NOTE — Patient Instructions (Signed)
Try to walk 10 min twice a day

## 2019-09-28 NOTE — Progress Notes (Signed)
Subjective:    Patient ID: [name], male    DOB: [birth year], 64 y.o.   MRN: [id number]  HPI  64 year old male with history of hepatitis B currently under treatment as well as chronic neck and low back pain.  He also complains of pains in his right lower extremity as his primary complaint.  He does get good pain relief with tramadol.  He takes this 4 times per day.  His last MRI was reviewed.  The patient states he has had no changes in his symptomatology over the last year.  His last visit with me was approximately 1 year ago.  He saw the: Physical medicine and rehabilitation nurse practitioner approximately 6 months ago. Low back pain  , neck pain  and RLE > RUE with numbness in Right thigh to foot Hep B stable   Retired, walks every day about 72min.  Shops and drives  No need for cane  Pain Inventory Average Pain 5 Pain Right Now 3 My pain is sharp  In the last 24 hours, has pain interfered with the following? General activity 5 Relation with others 4 Enjoyment of life 5 What TIME of day is your pain at its worst? evening Sleep (in general) Fair  Pain is worse with: walking, bending and standing Pain improves with: rest Relief from Meds: 8  Mobility walk without assistance how many minutes can you walk? 10 ability to climb steps?  yes do you drive?  yes  Function retired  Neuro/Psych No problems in this area  Prior Studies Any changes since last visit?  no Comparison: Lumbar spine radiographs 05/23/2012.  Abdominal pelvic CT 10/14/2011 and abdominal MRI 06/10/2009.  Findings: The lumbar alignment is normal.  There is no evidence of acute fracture or pars defect.  There are scattered chronic Schmorl's nodes.  In addition, nonspecific T2 hyperintense lesions are present posteriorly in the L1 and L2 vertebral bodies, unchanged from the prior abdominal MRI.  There are no suspicious osseous findings.  The conus medullaris extends to the T12 level and  appears normal. Mildly prominent retroperitoneal lymph nodes are unchanged.  A right renal cyst is partially imaged.  There are no significant disc space findings at L1-L2 or L2-L3.  L3-L4:  Mild disc bulging and mild facet hypertrophy.  No spinal stenosis or nerve root encroachment. This disc is partially calcified on CT.  L4-L5: There is disc bulging with a shallow right paracentral disc protrusion.  This narrows the right lateral recess and may contribute to right L5 nerve root encroachment.  There is no foraminal stenosis or L4 nerve root encroachment. The facet joints are mildly hypertrophied bilaterally.  L5-S1:  Mild disc bulging and bilateral facet hypertrophy.  No spinal stenosis or nerve root encroachment.  IMPRESSION:  1.  Shallow right paracentral disc protrusion at L4-L5 narrows the right lateral recess and may contribute to right L5 nerve root encroachment. 2.  No other significant spinal stenosis.  The foramina at all levels appear sufficiently patent. 3.  No evidence of leukemia within the lumbar spine.  Mildly prominent retroperitoneal lymph nodes are unchanged from prior CT.   Original Report Authenticated By: Richardean Sale, M.D. Physicians involved in your care Any changes since last visit?  no   Family History  Problem Relation Age of Onset  . Liver cancer Mother   . Diabetes Father   . Liver cancer Brother   . Heart attack Brother 11  . Heart attack Sister 29   Social History  Socioeconomic History  . Marital status: Married    Spouse name: Not on file  . Number of children: 3  . Years of education: Not on file  . Highest education level: Not on file  Occupational History  . Occupation: Unemployed  Tobacco Use  . Smoking status: Current Some Day Smoker    Packs/day: 0.50    Types: Cigarettes  . Smokeless tobacco: Never Used  Substance and Sexual Activity  . Alcohol use: No    Alcohol/week: 0.0 standard drinks  . Drug use: No   . Sexual activity: Not on file  Other Topics Concern  . Not on file  Social History Narrative   Some caffeine use    Social Determinants of Health   Financial Resource Strain:   . Difficulty of Paying Living Expenses:   Food Insecurity:   . Worried About Charity fundraiser in the Last Year:   . Arboriculturist in the Last Year:   Transportation Needs:   . Film/video editor (Medical):   Marland Kitchen Lack of Transportation (Non-Medical):   Physical Activity:   . Days of Exercise per Week:   . Minutes of Exercise per Session:   Stress:   . Feeling of Stress :   Social Connections:   . Frequency of Communication with Friends and Family:   . Frequency of Social Gatherings with Friends and Family:   . Attends Religious Services:   . Active Member of Clubs or Organizations:   . Attends Archivist Meetings:   Marland Kitchen Marital Status:    Past Surgical History:  Procedure Laterality Date  . HERNIA REPAIR     laparoscopic ventral   . SPLENECTOMY, TOTAL     Past Medical History:  Diagnosis Date  . Cervical radiculopathy   . GERD (gastroesophageal reflux disease)   . Hairy cell leukemia ([place]) 2005    Last chemo 2006  . Hepatitis B infection   . Incisional hernia   . Lumbar radiculopathy   . Memory impairment   . Myopathy    BP 126/84   Pulse 66   Temp 98.2 F (36.8 C)   Ht 5\' 8"  (1.727 m)   Wt 187 lb (84.8 kg)   SpO2 98%   BMI 28.43 kg/m   Opioid Risk Score:   Fall Risk Score:  `1  Depression screen PHQ 2/9  Depression screen [facility] 2/9 04/08/2016 10/06/2015  Decreased Interest 3 3  Down, Depressed, Hopeless 0 0  PHQ - 2 Score 3 3  Altered sleeping - 0  Tired, decreased energy - 1  Change in appetite - 1  Feeling bad or failure about yourself  - 1  Trouble concentrating - 1  Moving slowly or fidgety/restless - 0  Suicidal thoughts - 0  PHQ-9 Score - 7    Review of Systems  All other systems reviewed and are negative.      Objective:   Physical  Exam Vitals and nursing note reviewed.  Constitutional:      Appearance: Normal appearance.  Eyes:     Extraocular Movements: Extraocular movements intact.     Conjunctiva/sclera: Conjunctivae normal.     Pupils: Pupils are equal, round, and reactive to light.  Musculoskeletal:     Comments: Tenderness outpatient around C7 spinous process.  No pain with cervical spine range of motion There is also tenderness along the lumbar paraspinal around L5 area. Negative straight leg raising  Neurological:     General: No focal deficit  present.     Mental Status: He is alert and oriented to person, place, and time.     Cranial Nerves: No cranial nerve deficit, dysarthria or facial asymmetry.     Sensory: Sensation is intact.     Motor: No weakness or tremor.     Coordination: Coordination is intact.     Gait: Gait is intact.     Comments: Motor strength is 5/5 bilateral deltoid bicep tricep grip hip flexion extensor ankle dorsiflexor   Psychiatric:        Mood and Affect: Mood normal.        Behavior: Behavior normal.        Thought Content: Thought content normal.        Judgment: Judgment normal.           Assessment & Plan:  #1.  Lumbar spinal stenosis with low back pain right lower extremity chronic radicular pain.  This likely results from right L5 nerve root encroachment.  He is doing well and remains independent on his current pain medications.  He has had no signs of aberrant drug behavior. He has had no side effects from the tramadol  Urine toxicology there should be evidence of tramadol and metabolites. Continue current dose of tramadol 50 mg 4 times daily Follow-up in 6 months  We also discussed increasing his walking time.  He is currently going about 10 minutes once a day I have recommended walking twice a day 10 minutes which would be approximately 140 minutes/week just shy of under 150 minutes/week recommendation

## 2019-10-03 LAB — TOXASSURE SELECT,+ANTIDEPR,UR

## 2019-10-08 ENCOUNTER — Telehealth: Payer: Self-pay | Admitting: *Deleted

## 2019-10-08 NOTE — Telephone Encounter (Signed)
Urine drug screen for this encounter is consistent for prescribed medication 

## 2019-10-12 ENCOUNTER — Ambulatory Visit: Payer: Medicare Other | Admitting: Cardiology

## 2019-10-15 ENCOUNTER — Other Ambulatory Visit: Payer: Self-pay | Admitting: *Deleted

## 2019-11-16 ENCOUNTER — Other Ambulatory Visit: Payer: Self-pay | Admitting: Physical Medicine & Rehabilitation

## 2020-01-01 DIAGNOSIS — B181 Chronic viral hepatitis B without delta-agent: Secondary | ICD-10-CM | POA: Diagnosis not present

## 2020-01-25 DIAGNOSIS — K7469 Other cirrhosis of liver: Secondary | ICD-10-CM | POA: Diagnosis not present

## 2020-03-28 ENCOUNTER — Encounter: Payer: Medicare (Managed Care) | Attending: Registered Nurse | Admitting: Registered Nurse

## 2020-03-28 ENCOUNTER — Encounter: Payer: Self-pay | Admitting: Registered Nurse

## 2020-03-28 ENCOUNTER — Other Ambulatory Visit: Payer: Self-pay

## 2020-03-28 VITALS — BP 156/96 | HR 67 | Temp 97.9°F | Ht 68.0 in | Wt 186.6 lb

## 2020-03-28 DIAGNOSIS — G894 Chronic pain syndrome: Secondary | ICD-10-CM | POA: Insufficient documentation

## 2020-03-28 DIAGNOSIS — Z79899 Other long term (current) drug therapy: Secondary | ICD-10-CM | POA: Diagnosis present

## 2020-03-28 DIAGNOSIS — M48061 Spinal stenosis, lumbar region without neurogenic claudication: Secondary | ICD-10-CM | POA: Insufficient documentation

## 2020-03-28 DIAGNOSIS — M5416 Radiculopathy, lumbar region: Secondary | ICD-10-CM | POA: Diagnosis present

## 2020-03-28 DIAGNOSIS — Z5181 Encounter for therapeutic drug level monitoring: Secondary | ICD-10-CM | POA: Insufficient documentation

## 2020-03-28 MED ORDER — TRAMADOL HCL 50 MG PO TABS: ORAL_TABLET | ORAL | 4 refills | Status: DC

## 2020-03-28 NOTE — Progress Notes (Signed)
Subjective:    Patient ID: [name], male    DOB: [birth year], 64 y.o.   MRN: [id number]  HPI: [name] is a 64 y.o. male who returns for follow up appointment for chronic pain and medication refill. He states his pain is located in his lower back radiating into his right lower extremity. He rates his pain 4. His current exercise regime is walking and performing stretching exercises.  Mr. Emig Morphine equivalent is 20.00 MME.    Last UDS was Performed on 09/28/2019, it was consistent.   Pain Inventory Average Pain 5 Pain Right Now 4 My pain is sharp  In the last 24 hours, has pain interfered with the following? General activity 5 Relation with others 5 Enjoyment of life 5 What TIME of day is your pain at its worst? night Sleep (in general) Fair  Pain is worse with: walking Pain improves with: medication Relief from Meds: 7  Family History  Problem Relation Age of Onset  . Liver cancer Mother   . Diabetes Father   . Liver cancer Brother   . Heart attack Brother 38  . Heart attack Sister 69   Social History   Socioeconomic History  . Marital status: Married    Spouse name: Not on file  . Number of children: 3  . Years of education: Not on file  . Highest education level: Not on file  Occupational History  . Occupation: Unemployed  Tobacco Use  . Smoking status: Current Some Day Smoker    Packs/day: 0.50    Types: Cigarettes  . Smokeless tobacco: Never Used  Substance and Sexual Activity  . Alcohol use: No    Alcohol/week: 0.0 standard drinks  . Drug use: No  . Sexual activity: Not on file  Other Topics Concern  . Not on file  Social History Narrative   Some caffeine use    Social Determinants of Health   Financial Resource Strain:   . Difficulty of Paying Living Expenses: Not on file  Food Insecurity:   . Worried About Charity fundraiser in the Last Year: Not on file  . Ran Out of Food in the Last Year: Not on file   Transportation Needs:   . Lack of Transportation (Medical): Not on file  . Lack of Transportation (Non-Medical): Not on file  Physical Activity:   . Days of Exercise per Week: Not on file  . Minutes of Exercise per Session: Not on file  Stress:   . Feeling of Stress : Not on file  Social Connections:   . Frequency of Communication with Friends and Family: Not on file  . Frequency of Social Gatherings with Friends and Family: Not on file  . Attends Religious Services: Not on file  . Active Member of Clubs or Organizations: Not on file  . Attends Archivist Meetings: Not on file  . Marital Status: Not on file   Past Surgical History:  Procedure Laterality Date  . HERNIA REPAIR     laparoscopic ventral   . SPLENECTOMY, TOTAL     Past Surgical History:  Procedure Laterality Date  . HERNIA REPAIR     laparoscopic ventral   . SPLENECTOMY, TOTAL     Past Medical History:  Diagnosis Date  . Cervical radiculopathy   . GERD (gastroesophageal reflux disease)   . Hairy cell leukemia ([place]) 2005    Last chemo 2006  . Hepatitis B infection   . Incisional hernia   .  Lumbar radiculopathy   . Memory impairment   . Myopathy    BP (!) 156/96   Pulse 67   Temp 97.9 F (36.6 C)   Ht 5\' 8"  (1.727 m)   Wt 186 lb 9 oz (84.6 kg)   SpO2 97%   BMI 28.37 kg/m   Opioid Risk Score:   Fall Risk Score:  `1  Depression screen PHQ 2/9  Depression screen [facility] 2/9 04/08/2016 10/06/2015  Decreased Interest 3 3  Down, Depressed, Hopeless 0 0  PHQ - 2 Score 3 3  Altered sleeping - 0  Tired, decreased energy - 1  Change in appetite - 1  Feeling bad or failure about yourself  - 1  Trouble concentrating - 1  Moving slowly or fidgety/restless - 0  Suicidal thoughts - 0  PHQ-9 Score - 7    Review of Systems  Musculoskeletal: Positive for back pain.       Leg pain  Neurological: Positive for weakness and numbness.  All other systems reviewed and are negative.      Objective:    Physical Exam Vitals and nursing note reviewed.  Constitutional:      Appearance: Normal appearance.  Cardiovascular:     Rate and Rhythm: Normal rate and regular rhythm.     Pulses: Normal pulses.     Heart sounds: Normal heart sounds.  Pulmonary:     Effort: Pulmonary effort is normal.     Breath sounds: Normal breath sounds.  Musculoskeletal:     Cervical back: Normal range of motion and neck supple.     Comments: Normal Muscle Bulk and Muscle Testing Reveals:  Upper Extremities: Full ROM and Muscle Strength 5/5 Lumbar Paraspinal Tenderness: L-3-L-5 Lower Extremities: Full ROM and Muscle Strength 5/5 Arises from chair with ease Narrow Based  Gait   Skin:    General: Skin is warm and dry.  Neurological:     Mental Status: He is alert and oriented to person, place, and time.  Psychiatric:        Mood and Affect: Mood normal.        Behavior: Behavior normal.           Assessment & Plan:  1. Lumbar Degenerative Disc with Lumbar Radiculitis: Continue HEP as Tolerated. Refilled:Tramadol 50 mg take two tablets twice a day as needed for pain #120.  We will continue the opioid monitoring program, this consists of regular clinic visits, examinations, urine drug screen, pill counts as well as use of [place] Controlled Substance Reporting system. A 12 month History has been reviewed on the [place] on 03/28/2020. 2. Chronic Right Shoulder Pain: No complaints Today.  Continue HEP as Tolerated. Continue to Monitor.   Return in 6 months

## 2020-07-01 DIAGNOSIS — F17200 Nicotine dependence, unspecified, uncomplicated: Secondary | ICD-10-CM | POA: Diagnosis not present

## 2020-07-01 DIAGNOSIS — B181 Chronic viral hepatitis B without delta-agent: Secondary | ICD-10-CM | POA: Diagnosis not present

## 2020-07-01 DIAGNOSIS — M545 Low back pain, unspecified: Secondary | ICD-10-CM | POA: Diagnosis not present

## 2020-07-01 DIAGNOSIS — R202 Paresthesia of skin: Secondary | ICD-10-CM | POA: Diagnosis not present

## 2020-07-01 DIAGNOSIS — R7989 Other specified abnormal findings of blood chemistry: Secondary | ICD-10-CM | POA: Diagnosis not present

## 2020-07-01 DIAGNOSIS — E538 Deficiency of other specified B group vitamins: Secondary | ICD-10-CM | POA: Diagnosis not present

## 2020-07-01 DIAGNOSIS — M5126 Other intervertebral disc displacement, lumbar region: Secondary | ICD-10-CM | POA: Diagnosis not present

## 2020-07-28 ENCOUNTER — Other Ambulatory Visit: Payer: Self-pay | Admitting: Physical Medicine & Rehabilitation

## 2020-08-22 ENCOUNTER — Other Ambulatory Visit: Payer: Self-pay | Admitting: Registered Nurse

## 2020-09-23 ENCOUNTER — Telehealth: Payer: Self-pay | Admitting: Hematology

## 2020-09-23 NOTE — Telephone Encounter (Signed)
Received a new pt referral from Dr. Jacelyn Grip for hx of hairy cell leukemia. [name] returned my call and has been scheduled to see Dr. Irene Limbo on 4/28 at 11am. Pt aware to arrive 20 minutes early.

## 2020-09-26 ENCOUNTER — Other Ambulatory Visit: Payer: Self-pay

## 2020-09-26 ENCOUNTER — Encounter: Payer: Self-pay | Admitting: Registered Nurse

## 2020-09-26 ENCOUNTER — Encounter: Payer: Medicare (Managed Care) | Attending: Registered Nurse | Admitting: Registered Nurse

## 2020-09-26 VITALS — BP 114/76 | HR 70 | Temp 98.3°F | Ht 68.0 in | Wt 181.8 lb

## 2020-09-26 DIAGNOSIS — Z5181 Encounter for therapeutic drug level monitoring: Secondary | ICD-10-CM | POA: Insufficient documentation

## 2020-09-26 DIAGNOSIS — G894 Chronic pain syndrome: Secondary | ICD-10-CM | POA: Diagnosis present

## 2020-09-26 DIAGNOSIS — Z79899 Other long term (current) drug therapy: Secondary | ICD-10-CM | POA: Insufficient documentation

## 2020-09-26 DIAGNOSIS — M5416 Radiculopathy, lumbar region: Secondary | ICD-10-CM | POA: Diagnosis present

## 2020-09-26 DIAGNOSIS — M48061 Spinal stenosis, lumbar region without neurogenic claudication: Secondary | ICD-10-CM | POA: Diagnosis present

## 2020-09-26 MED ORDER — TRAMADOL HCL 50 MG PO TABS: 100.0000 mg | ORAL_TABLET | Freq: Two times a day (BID) | ORAL | 5 refills | Status: DC

## 2020-09-26 NOTE — Progress Notes (Signed)
Subjective:    Patient ID: [name], male    DOB: 12-Feb-1956, 65 y.o.   MRN: [id number]  HPI: [name] is a 66 y.o. male who returns for follow up appointment for chronic pain and medication refill. He state his pain is located in his lower back radiating into his right lower extremities. He rates his pain 2. His current exercise regime is walking and performing stretching exercises.  [name] forgot his medication, he was educated on the narcotic policy, he verbalizes understanding. He is aware he must bring his medication bottle with him to all appointments, he verbalizes understanding.   [name] Morphine equivalent is 20.00 MME.  Last UDS was Performed on 09/28/2019, it was consistent.   Pain Inventory Average Pain 6 Pain Right Now 2 My pain is sharp and tingling  In the last 24 hours, has pain interfered with the following? General activity 5 Relation with others 4 Enjoyment of life 5 What TIME of day is your pain at its worst? daytime and night Sleep (in general) Fair  Pain is worse with: walking and sitting Pain improves with: rest Relief from Meds: 3  Family History  Problem Relation Age of Onset  . Liver cancer Mother   . Diabetes Father   . Liver cancer Brother   . Heart attack Brother 75  . Heart attack Sister 66   Social History   Socioeconomic History  . Marital status: Married    Spouse name: Not on file  . Number of children: 3  . Years of education: Not on file  . Highest education level: Not on file  Occupational History  . Occupation: Unemployed  Tobacco Use  . Smoking status: Current Some Day Smoker    Packs/day: 0.50    Types: Cigarettes  . Smokeless tobacco: Never Used  Substance and Sexual Activity  . Alcohol use: No    Alcohol/week: 0.0 standard drinks  . Drug use: No  . Sexual activity: Not on file  Other Topics Concern  . Not on file  Social History Narrative   Some caffeine use    Social  Determinants of Health   Financial Resource Strain: Not on file  Food Insecurity: Not on file  Transportation Needs: Not on file  Physical Activity: Not on file  Stress: Not on file  Social Connections: Not on file   Past Surgical History:  Procedure Laterality Date  . HERNIA REPAIR     laparoscopic ventral   . SPLENECTOMY, TOTAL     Past Surgical History:  Procedure Laterality Date  . HERNIA REPAIR     laparoscopic ventral   . SPLENECTOMY, TOTAL     Past Medical History:  Diagnosis Date  . Cervical radiculopathy   . GERD (gastroesophageal reflux disease)   . Hairy cell leukemia ([place]) 2005    Last chemo 2006  . Hepatitis B infection   . Incisional hernia   . Lumbar radiculopathy   . Memory impairment   . Myopathy    BP 114/76   Pulse 70   Temp 98.3 F (36.8 C)   Ht 5\' 8"  (1.727 m)   Wt 181 lb 12.8 oz (82.5 kg)   SpO2 94%   BMI 27.64 kg/m   Opioid Risk Score:   Fall Risk Score:  `1  Depression screen PHQ 2/9  Depression screen [facility] 2/9 04/08/2016 10/06/2015  Decreased Interest 3 3  Down, Depressed, Hopeless 0 0  PHQ - 2 Score 3 3  Altered sleeping - 0  Tired, decreased energy - 1  Change in appetite - 1  Feeling bad or failure about yourself  - 1  Trouble concentrating - 1  Moving slowly or fidgety/restless - 0  Suicidal thoughts - 0  PHQ-9 Score - 7      Review of Systems  Constitutional: Negative.   HENT: Negative.   Eyes: Negative.   Respiratory: Negative.   Cardiovascular: Negative.   Gastrointestinal: Negative.   Endocrine: Negative.   Genitourinary: Negative.   Musculoskeletal:       RIGHT SHOULDER , RIGHT ARM, RIGHT LEG  Skin: Negative.   Allergic/Immunologic: Negative.   Neurological: Negative.   Hematological: Negative.   Psychiatric/Behavioral: Negative.        Objective:   Physical Exam Vitals and nursing note reviewed.  Constitutional:      Appearance: Normal appearance.  Cardiovascular:     Rate and Rhythm: Normal rate  and regular rhythm.     Pulses: Normal pulses.     Heart sounds: Normal heart sounds.  Pulmonary:     Effort: Pulmonary effort is normal.     Breath sounds: Normal breath sounds.  Musculoskeletal:     Cervical back: Normal range of motion and neck supple.     Comments: Normal Muscle Bulk and Muscle Testing Reveals:  Upper Extremities: Full ROM and Muscle Strength 5/5  Lumbar Paraspinal Tenderness: L-4-L-5 Mainly Right Side Lower Extremities: Full ROM and Muscle Strength 5/5 Arises from chair with ease Narrow Based Gait   Skin:    General: Skin is warm and dry.  Neurological:     Mental Status: He is alert and oriented to person, place, and time.  Psychiatric:        Mood and Affect: Mood normal.        Behavior: Behavior normal.           Assessment & Plan:  1. Lumbar Degenerative Disc with Lumbar Radiculitis: Continue HEP as Tolerated.09/26/20 Refilled:Tramadol 50 mg take two tablets twice a day as needed for pain #120. We will continue the opioid monitoring program, this consists of regular clinic visits, examinations, urine drug screen, pill counts as well as use of [place] Controlled Substance Reporting system. A 12 month History has been reviewed on the [place] on 09/26/2020. 2. Chronic Right Shoulder Pain: No complaints Today.  Continue HEP as Tolerated. Continue to Monitor.09/26/2020  Return in58months

## 2020-10-01 NOTE — Progress Notes (Incomplete)
HEMATOLOGY/ONCOLOGY CONSULTATION NOTE  Date of Service: 10/01/2020  Patient Care Team: Marty Heck, MD as PCP - General (Gastroenterology)  CHIEF COMPLAINTS/PURPOSE OF CONSULTATION:  Hx of Leukemia  HISTORY OF PRESENTING ILLNESS:  [name] is a wonderful 65 y.o. male who has been referred to [place] by Dr. Marty Heck, MD for evaluation and management of hx of leukemia. The pt reports that he is doing well overall.  The pt reports ***  Lab results *** of CBC w/diff and CMP is as follows: all values are WNL except for ***  On review of systems, pt reports *** and denies *** and any other symptoms.   MEDICAL HISTORY:  Past Medical History:  Diagnosis Date  . Cervical radiculopathy   . GERD (gastroesophageal reflux disease)   . Hairy cell leukemia ([place]) 2005    Last chemo 2006  . Hepatitis B infection   . Incisional hernia   . Lumbar radiculopathy   . Memory impairment   . Myopathy     SURGICAL HISTORY: Past Surgical History:  Procedure Laterality Date  . HERNIA REPAIR     laparoscopic ventral   . SPLENECTOMY, TOTAL      SOCIAL HISTORY: Social History   Socioeconomic History  . Marital status: Married    Spouse name: Not on file  . Number of children: 3  . Years of education: Not on file  . Highest education level: Not on file  Occupational History  . Occupation: Unemployed  Tobacco Use  . Smoking status: Current Some Day Smoker    Packs/day: 0.50    Types: Cigarettes  . Smokeless tobacco: Never Used  Substance and Sexual Activity  . Alcohol use: No    Alcohol/week: 0.0 standard drinks  . Drug use: No  . Sexual activity: Not on file  Other Topics Concern  . Not on file  Social History Narrative   Some caffeine use    Social Determinants of Health   Financial Resource Strain: Not on file  Food Insecurity: Not on file  Transportation Needs: Not on file  Physical Activity: Not on file  Stress: Not on file  Social Connections: Not on  file  Intimate Partner Violence: Not on file    FAMILY HISTORY: Family History  Problem Relation Age of Onset  . Liver cancer Mother   . Diabetes Father   . Liver cancer Brother   . Heart attack Brother 6  . Heart attack Sister 84    ALLERGIES:  is allergic to metrizamide and iodinated diagnostic agents.  MEDICATIONS:  Current Outpatient Medications  Medication Sig Dispense Refill  . aspirin EC 81 MG tablet Take 1 tablet (81 mg total) by mouth daily. 90 tablet 3  . traMADol (ULTRAM) 50 MG tablet Take 2 tablets (100 mg total) by mouth 2 (two) times daily. 120 tablet 5  . VIREAD 300 MG tablet Take 300 mg by mouth daily.   11   No current facility-administered medications for this visit.    REVIEW OF SYSTEMS:   10 Point review of Systems was done is negative except as noted above.  PHYSICAL EXAMINATION: ECOG PERFORMANCE STATUS: {CHL ONC ECOG ZM:[id number]}  .There were no vitals filed for this visit. There were no vitals filed for this visit. .There is no height or weight on file to calculate BMI.  *** GENERAL:alert, in no acute distress and comfortable SKIN: no acute rashes, no significant lesions EYES: conjunctiva are pink and non-injected, sclera anicteric OROPHARYNX: MMM, no exudates,  no oropharyngeal erythema or ulceration NECK: supple, no JVD LYMPH:  no palpable lymphadenopathy in the cervical, axillary or inguinal regions LUNGS: clear to auscultation b/l with normal respiratory effort HEART: regular rate & rhythm ABDOMEN:  normoactive bowel sounds , non tender, not distended. Extremity: no pedal edema PSYCH: alert & oriented x 3 with fluent speech NEURO: no focal motor/sensory deficits  LABORATORY DATA:  I have reviewed the data as listed  . CBC Latest Ref Rng & Units 03/12/2016 05/06/2013 05/27/2011  WBC 4.0 - 10.5 K/uL 6.5 9.0 7.0  Hemoglobin 13.0 - 17.0 g/dL 13.4 13.7 14.7  Hematocrit 39.0 - 52.0 % 37.1(L) 36.7(L) 42.6  Platelets 150 - 400 K/uL 325 278  291    . CMP Latest Ref Rng & Units 03/12/2016 05/06/2013 12/02/2011  Glucose 65 - 99 mg/dL 110(H) 122(H) -  BUN 6 - 20 mg/dL 20 12 -  Creatinine 0.61 - 1.24 mg/dL 0.64 0.60 -  Sodium 135 - 145 mmol/L 136 122(L) -  Potassium 3.5 - 5.1 mmol/L 3.8 3.9 -  Chloride 101 - 111 mmol/L 106 87(L) -  CO2 22 - 32 mmol/L 25 21 -  Calcium 8.9 - 10.3 mg/dL 8.8(L) 8.8 -  Total Protein 6.5 - 8.1 g/dL 7.6 - 6.7  Total Bilirubin 0.3 - 1.2 mg/dL 12.1(H) - 0.4  Alkaline Phos 38 - 126 U/L 284(H) - 83  AST 15 - 41 U/L 533(H) - 48(H)  ALT 17 - 63 U/L 444(H) - 28     RADIOGRAPHIC STUDIES: I have personally reviewed the radiological images as listed and agreed with the findings in the report. No results found.  ASSESSMENT & PLAN:    PLAN: -***   -Will see back ***    FOLLOW UP: ***   All of the patients questions were answered with apparent satisfaction. The patient knows to call the clinic with any problems, questions or concerns.  I spent {CHL ONC TIME VISIT - JYNWG:[id number]} counseling the patient face to face. The total time spent in the appointment was {CHL ONC TIME VISIT - HQION:[id number]} and more than 50% was on counseling and direct patient cares.    Sullivan Lone MD [place] AAHIVMS [facility] Hematology/Oncology Physician [facility]  (Office):       [phone] (Work cell):  [phone] (Fax):           [phone]  10/01/2020 9:00 PM  I, Reinaldo Raddle, am acting as scribe for Dr. Sullivan Lone, MD.

## 2020-10-02 ENCOUNTER — Inpatient Hospital Stay: Payer: Medicare (Managed Care) | Admitting: Hematology

## 2020-10-02 ENCOUNTER — Telehealth: Payer: Self-pay | Admitting: Hematology

## 2020-10-02 NOTE — Telephone Encounter (Signed)
Received a call from [name] to reschedule his new pt appt to 5/3 at 11am.

## 2020-10-03 LAB — TOXASSURE SELECT,+ANTIDEPR,UR

## 2020-10-06 ENCOUNTER — Telehealth: Payer: Self-pay | Admitting: *Deleted

## 2020-10-06 NOTE — Telephone Encounter (Signed)
Urine drug screen for this encounter is consistent for prescribed medication 

## 2020-10-06 NOTE — Progress Notes (Signed)
HEMATOLOGY/ONCOLOGY CONSULTATION NOTE  Date of Service: 10/07/2020  Patient Care Team: Marty Heck, MD as PCP - General (Gastroenterology)  CHIEF COMPLAINTS/PURPOSE OF CONSULTATION:  Hx of Hairy cell Leukemia  HISTORY OF PRESENTING ILLNESS:   [name] is a wonderful 65 y.o. male who has been referred to [place] by Dr. Jacelyn Grip for evaluation and management of hx of hairy cell leukemia. The pt reports that he is doing well overall.  The pt reports that he saw Dr. Humphrey Rolls here in 2005 and was diagnosed with hairy cell leukemia. The pt has been in remission since 2006.  The pt notes that he was able to tolerate the Cladribine inpatient chemotherapy treatment as well as possible. The pt is not sure why he was referred here and that his Hgb was elevated is potentially why. The pt notes he was referred by his PCP Dr. Jacelyn Grip at [place]. The pt notes that he is still a current everyday smoker at around one pack each day or just under this. The pt notes that he was diagnosed with Hepatitis B in 2005. He notes that to his knowledge he was not diagnosed until after he had started treatment and needing blood transfusions. The pt denies any history of blood transfusions in the past prior to this. The pt currently sees his doctor and gets an ultrasound regarding his liver cirrhosis every six months. This has been very stable. The pt notes that he was not told why his B12 was low. The pt notes he is unable to follow Ramadan as he has to eat and drink something due to his medical exceptions. The pt notes that he does not eat much meat, denying any red meat. The pt notes he has been on B12 replacement for the last two months. According to the pt, he does not feel any different now than he did six months or one year ago. The pt notes that he currently does not work and notes no allergies. The pt notes his mother passed away from liver cancer, denying her having Hepatitis. The pt notes his brother also passed away from  liver cancer, not sure if he had Hepatitis or not. The pt notes that they were living back home in [place] and not receiving very good medical care.  The pt notes no other questions or concerns at this time. The pt denies any exposure to chemicals. The pt used to work in [employer] and notes no specific radiation exposure. The pt notes he currently sees Dr. Alessandra Bevels for his Viread management, but he missed two appointments and is no longer allowed to go there. The pt is currently looking for another a doctor to manage his Viread.  Lab results 06/03/2020 of CBC w/diff and CMP is as follows: all values are WNL except for Hgb of 17.2, MCV of 98.4, Mch of 33.4, Lymph Abs of 3.40K. 06/03/2020 Vitamin B12 of 142.  On review of systems, pt denies fevers, chills, night sweats, sudden weight loss, decreased appetite, leg swelling, and any other symptoms.  MEDICAL HISTORY:  Past Medical History:  Diagnosis Date  . Cervical radiculopathy   . GERD (gastroesophageal reflux disease)   . Hairy cell leukemia ([place]) 2005    Last chemo 2006  . Hepatitis B infection   . Incisional hernia   . Lumbar radiculopathy   . Memory impairment   . Myopathy     SURGICAL HISTORY: Past Surgical History:  Procedure Laterality Date  . HERNIA REPAIR  laparoscopic ventral   . SPLENECTOMY, TOTAL      SOCIAL HISTORY: Social History   Socioeconomic History  . Marital status: Married    Spouse name: Not on file  . Number of children: 3  . Years of education: Not on file  . Highest education level: Not on file  Occupational History  . Occupation: Unemployed  Tobacco Use  . Smoking status: Current Some Day Smoker    Packs/day: 0.50    Types: Cigarettes  . Smokeless tobacco: Never Used  Substance and Sexual Activity  . Alcohol use: No    Alcohol/week: 0.0 standard drinks  . Drug use: No  . Sexual activity: Not on file  Other Topics Concern  . Not on file  Social History Narrative   Some caffeine use     Social Determinants of Health   Financial Resource Strain: Not on file  Food Insecurity: Not on file  Transportation Needs: Not on file  Physical Activity: Not on file  Stress: Not on file  Social Connections: Not on file  Intimate Partner Violence: Not on file    FAMILY HISTORY: Family History  Problem Relation Age of Onset  . Liver cancer Mother   . Diabetes Father   . Liver cancer Brother   . Heart attack Brother 44  . Heart attack Sister 37    ALLERGIES:  is allergic to metrizamide and iodinated diagnostic agents.  MEDICATIONS:  Current Outpatient Medications  Medication Sig Dispense Refill  . aspirin EC 81 MG tablet Take 1 tablet (81 mg total) by mouth daily. 90 tablet 3  . traMADol (ULTRAM) 50 MG tablet Take 2 tablets (100 mg total) by mouth 2 (two) times daily. 120 tablet 5  . VIREAD 300 MG tablet Take 300 mg by mouth daily.   11   No current facility-administered medications for this visit.    REVIEW OF SYSTEMS:    10 Point review of Systems was done is negative except as noted above.  PHYSICAL EXAMINATION: ECOG PERFORMANCE STATUS: 0 - Asymptomatic  . Vitals:   10/07/20 1104  BP: (!) 154/97  Pulse: 70  Resp: 17  Temp: 98.2 F (36.8 C)  SpO2: 100%   Filed Weights   10/07/20 1104  Weight: 179 lb 12.8 oz (81.6 kg)   .Body mass index is 27.34 kg/m.   GENERAL:alert, in no acute distress and comfortable SKIN: no acute rashes, no significant lesions EYES: conjunctiva are pink and non-injected, sclera anicteric OROPHARYNX: MMM, no exudates, no oropharyngeal erythema or ulceration NECK: supple, no JVD LYMPH:  no palpable lymphadenopathy in the cervical, axillary or inguinal regions LUNGS: clear to auscultation b/l with normal respiratory effort HEART: regular rate & rhythm ABDOMEN:  normoactive bowel sounds , non tender, not distended. Extremity: no pedal edema PSYCH: alert & oriented x 3 with fluent speech NEURO: no focal motor/sensory  deficits  LABORATORY DATA:  I have reviewed the data as listed  . CBC Latest Ref Rng & Units 10/07/2020 03/12/2016 05/06/2013  WBC 4.0 - 10.5 K/uL 7.1 6.5 9.0  Hemoglobin 13.0 - 17.0 g/dL 16.7 13.4 13.7  Hematocrit 39.0 - 52.0 % 48.1 37.1(L) 36.7(L)  Platelets 150 - 400 K/uL 336 325 278   . CBC    Component Value Date/Time   WBC 7.1 10/07/2020 1207   RBC 5.01 10/07/2020 1207   HGB 16.7 10/07/2020 1207   HGB 16.1 04/09/2009 1024   HGB 14.8 09/16/2005 0822   HCT 48.1 10/07/2020 1207   HCT 47.9  04/09/2009 1024   HCT 43.4 09/16/2005 0822   PLT 336 10/07/2020 1207   PLT 265 04/09/2009 1024   PLT 330 09/16/2005 0822   MCV 96.0 10/07/2020 1207   MCV 99 (H) 04/09/2009 1024   MCV 94.8 09/16/2005 0822   MCH 33.3 10/07/2020 1207   MCHC 34.7 10/07/2020 1207   RDW 15.3 10/07/2020 1207   RDW 11.7 04/09/2009 1024   RDW 15.7 (H) 09/16/2005 0822   LYMPHSABS 2.7 10/07/2020 1207   LYMPHSABS 2.6 04/09/2009 1024   LYMPHSABS 1.9 09/16/2005 0822   MONOABS 0.5 10/07/2020 1207   MONOABS 0.9 09/16/2005 0822   EOSABS 0.1 10/07/2020 1207   EOSABS 0.2 04/09/2009 1024   BASOSABS 0.1 10/07/2020 1207   BASOSABS 0.1 04/09/2009 1024   BASOSABS 0.0 09/16/2005 0822    . CMP Latest Ref Rng & Units 10/07/2020 03/12/2016 05/06/2013  Glucose 70 - 99 mg/dL 89 110(H) 122(H)  BUN 8 - 23 mg/dL 13 20 12   Creatinine 0.61 - 1.24 mg/dL 0.81 0.64 0.60  Sodium 135 - 145 mmol/L 140 136 122(L)  Potassium 3.5 - 5.1 mmol/L 3.7 3.8 3.9  Chloride 98 - 111 mmol/L 104 106 87(L)  CO2 22 - 32 mmol/L 27 25 21   Calcium 8.9 - 10.3 mg/dL 9.9 8.8(L) 8.8  Total Protein 6.5 - 8.1 g/dL 8.8(H) 7.6 -  Total Bilirubin 0.3 - 1.2 mg/dL 0.6 12.1(H) -  Alkaline Phos 38 - 126 U/L 119 284(H) -  AST 15 - 41 U/L 25 533(H) -  ALT 0 - 44 U/L 26 444(H) -     RADIOGRAPHIC STUDIES: I have personally reviewed the radiological images as listed and agreed with the findings in the report. No results found.  ASSESSMENT & PLAN:   65 yo with    1) H/o Hairy cell leukemia 2) B12 deficiency 3) h/o Hepatitis B  PLAN: -Advised pt that smoking can increase the Hgb and lymphocyte counts.  -Advised pt we will send out labs today to ensure there is no bone marrow abnormality causing this elevated blood counts. -Advised pt that liver cirrhosis and Hepatitis B can cause changes in signaling, but the elevated blood counts is most likely due to smoking. -Recommended pt work towards smoking cessation. The pt notes that his smoking is driven more by habit as opposed to stress. -Will get labs today. Will get parietal cell and intrinsic cell antibodies to observe for potential causes underlying Vitamin B12 deficiency. -Will see back in 3 weeks via phone.    FOLLOW UP: Labs today Phone visit with Dr Irene Limbo in 2-3 weeks  . Orders Placed This Encounter  Procedures  . CBC with Differential/Platelet    Standing Status:   Future    Number of Occurrences:   1    Standing Expiration Date:   10/07/2021  . CMP ([place] only)    Standing Status:   Future    Number of Occurrences:   1    Standing Expiration Date:   10/07/2021  . Vitamin B12    Standing Status:   Future    Number of Occurrences:   1    Standing Expiration Date:   10/07/2021  . Anti-parietal antibody    Standing Status:   Future    Number of Occurrences:   1    Standing Expiration Date:   10/07/2021  . Intrinsic factor antibodies    Standing Status:   Future    Number of Occurrences:   1    Standing  Expiration Date:   10/07/2021  . JAK2 (including V617F and Exon 12), MPL, and CALR-Next Generation Sequencing    Standing Status:   Future    Number of Occurrences:   1    Standing Expiration Date:   10/07/2021  . Flow Cytometry, Peripheral Blood (Oncology)    Plz evaluate for possible recurrence of hairy cell leukemia.    Standing Status:   Future    Number of Occurrences:   1    Standing Expiration Date:   10/07/2021     All of the patients questions were answered with  apparent satisfaction. The patient knows to call the clinic with any problems, questions or concerns.  I spent 40 minutes counseling the patient face to face. The total time spent in the appointment was 60 minutes and more than 50% was on counseling and direct patient cares.    Sullivan Lone MD [place] AAHIVMS [facility] Hematology/Oncology Physician [facility]  (Office):       [phone] (Work cell):  [phone] (Fax):           [phone]  10/07/2020 12:05 PM  I, Reinaldo Raddle, am acting as scribe for Dr. Sullivan Lone, MD.   .I have reviewed the above documentation for accuracy and completeness, and I agree with the above. Brunetta Genera MD

## 2020-10-07 ENCOUNTER — Other Ambulatory Visit: Payer: Self-pay

## 2020-10-07 ENCOUNTER — Inpatient Hospital Stay: Payer: Medicare (Managed Care) | Attending: Hematology | Admitting: Hematology

## 2020-10-07 ENCOUNTER — Inpatient Hospital Stay: Payer: Medicare (Managed Care)

## 2020-10-07 VITALS — BP 154/97 | HR 70 | Temp 98.2°F | Resp 17 | Wt 179.8 lb

## 2020-10-07 DIAGNOSIS — Z800 Family history of malignant neoplasm of digestive organs: Secondary | ICD-10-CM | POA: Insufficient documentation

## 2020-10-07 DIAGNOSIS — C9140 Hairy cell leukemia not having achieved remission: Secondary | ICD-10-CM | POA: Insufficient documentation

## 2020-10-07 DIAGNOSIS — C9141 Hairy cell leukemia, in remission: Secondary | ICD-10-CM | POA: Diagnosis not present

## 2020-10-07 DIAGNOSIS — Z8619 Personal history of other infectious and parasitic diseases: Secondary | ICD-10-CM | POA: Insufficient documentation

## 2020-10-07 DIAGNOSIS — D751 Secondary polycythemia: Secondary | ICD-10-CM

## 2020-10-07 DIAGNOSIS — E538 Deficiency of other specified B group vitamins: Secondary | ICD-10-CM

## 2020-10-07 DIAGNOSIS — F17210 Nicotine dependence, cigarettes, uncomplicated: Secondary | ICD-10-CM | POA: Diagnosis not present

## 2020-10-07 DIAGNOSIS — Z7982 Long term (current) use of aspirin: Secondary | ICD-10-CM | POA: Insufficient documentation

## 2020-10-07 DIAGNOSIS — Z79899 Other long term (current) drug therapy: Secondary | ICD-10-CM | POA: Insufficient documentation

## 2020-10-07 DIAGNOSIS — Z833 Family history of diabetes mellitus: Secondary | ICD-10-CM | POA: Insufficient documentation

## 2020-10-07 DIAGNOSIS — Z8249 Family history of ischemic heart disease and other diseases of the circulatory system: Secondary | ICD-10-CM | POA: Insufficient documentation

## 2020-10-07 LAB — CMP (CANCER CENTER ONLY)
ALT: 26 U/L (ref 0–44)
AST: 25 U/L (ref 15–41)
Albumin: 4.6 g/dL (ref 3.5–5.0)
Alkaline Phosphatase: 119 U/L (ref 38–126)
Anion gap: 9 (ref 5–15)
BUN: 13 mg/dL (ref 8–23)
CO2: 27 mmol/L (ref 22–32)
Calcium: 9.9 mg/dL (ref 8.9–10.3)
Chloride: 104 mmol/L (ref 98–111)
Creatinine: 0.81 mg/dL (ref 0.61–1.24)
GFR, Estimated: >60 mL/min (ref >60)
Glucose, Bld: 89 mg/dL (ref 70–99)
Potassium: 3.7 mmol/L (ref 3.5–5.1)
Sodium: 140 mmol/L (ref 135–145)
Total Bilirubin: 0.6 mg/dL (ref 0.3–1.2)
Total Protein: 8.8 g/dL — ABNORMAL HIGH (ref 6.5–8.1)

## 2020-10-07 LAB — VITAMIN B12: Vitamin B-12: 124 pg/mL — ABNORMAL LOW (ref 180–914)

## 2020-10-07 LAB — CBC WITH DIFFERENTIAL/PLATELET
Abs Immature Granulocytes: 0.01 10*3/uL (ref 0.00–0.07)
Basophils Absolute: 0.1 10*3/uL (ref 0.0–0.1)
Basophils Relative: 1 %
Eosinophils Absolute: 0.1 10*3/uL (ref 0.0–0.5)
Eosinophils Relative: 1 %
HCT: 48.1 % (ref 39.0–52.0)
Hemoglobin: 16.7 g/dL (ref 13.0–17.0)
Immature Granulocytes: 0 %
Lymphocytes Relative: 38 %
Lymphs Abs: 2.7 10*3/uL (ref 0.7–4.0)
MCH: 33.3 pg (ref 26.0–34.0)
MCHC: 34.7 g/dL (ref 30.0–36.0)
MCV: 96 fL (ref 80.0–100.0)
Monocytes Absolute: 0.5 10*3/uL (ref 0.1–1.0)
Monocytes Relative: 7 %
Neutro Abs: 3.8 10*3/uL (ref 1.7–7.7)
Neutrophils Relative %: 53 %
Platelets: 336 10*3/uL (ref 150–400)
RBC: 5.01 MIL/uL (ref 4.22–5.81)
RDW: 15.3 % (ref 11.5–15.5)
WBC: 7.1 10*3/uL (ref 4.0–10.5)
nRBC: 0 % (ref 0.0–0.2)

## 2020-10-08 LAB — INTRINSIC FACTOR ANTIBODIES: Intrinsic Factor: 1 AU/mL (ref 0.0–1.1)

## 2020-10-08 LAB — SURGICAL PATHOLOGY

## 2020-10-08 LAB — ANTI-PARIETAL ANTIBODY: Parietal Cell Antibody-IgG: 0.9 Units (ref 0.0–20.0)

## 2020-10-09 LAB — FLOW CYTOMETRY

## 2020-10-14 LAB — JAK2 (INCLUDING V617F AND EXON 12), MPL,& CALR-NEXT GEN SEQ

## 2020-10-27 NOTE — Progress Notes (Signed)
HEMATOLOGY/ONCOLOGY CONSULTATION NOTE  Date of Service: 10/28/2020  Patient Care Team: Marty Heck, MD as PCP - General (Gastroenterology)  CHIEF COMPLAINTS/PURPOSE OF CONSULTATION:  Hx of Hairy cell Leukemia  HISTORY OF PRESENTING ILLNESS:   [name] is a wonderful 64 y.o. male who has been referred to [place] by Dr. Jacelyn Grip for evaluation and management of hx of hairy cell leukemia. The pt reports that he is doing well overall.  The pt reports that he saw Dr. Humphrey Rolls here in 2005 and was diagnosed with hairy cell leukemia. The pt has been in remission since 2006.  The pt notes that he was able to tolerate the Cladribine inpatient chemotherapy treatment as well as possible. The pt is not sure why he was referred here and that his Hgb was elevated is potentially why. The pt notes he was referred by his PCP Dr. Jacelyn Grip at [facility]. The pt notes that he is still a current everyday smoker at around one pack each day or just under this. The pt notes that he was diagnosed with Hepatitis B in 2005. He notes that to his knowledge he was not diagnosed until after he had started treatment and needing blood transfusions. The pt denies any history of blood transfusions in the past prior to this. The pt currently sees his doctor and gets an ultrasound regarding his liver cirrhosis every six months. This has been very stable. The pt notes that he was not told why his B12 was low. The pt notes he is unable to follow Ramadan as he has to eat and drink something due to his medical exceptions. The pt notes that he does not eat much meat, denying any red meat. The pt notes he has been on B12 replacement for the last two months. According to the pt, he does not feel any different now than he did six months or one year ago. The pt notes that he currently does not work and notes no allergies. The pt notes his mother passed away from liver cancer, denying her having Hepatitis. The pt notes his brother also passed away from  liver cancer, not sure if he had Hepatitis or not. The pt notes that they were living back home in [place] and not receiving very good medical care.  The pt notes no other questions or concerns at this time. The pt denies any exposure to chemicals. The pt used to work in [employer] and notes no specific radiation exposure. The pt notes he currently sees Dr. Alessandra Bevels for his Viread management, but he missed two appointments and is no longer allowed to go there. The pt is currently looking for another a doctor to manage his Viread.  Lab results 06/03/2020 of CBC w/diff and CMP is as follows: all values are WNL except for Hgb of 17.2, MCV of 98.4, Mch of 33.4, Lymph Abs of 3.40K. 06/03/2020 Vitamin B12 of 142.  On review of systems, pt denies fevers, chills, night sweats, sudden weight loss, decreased appetite, leg swelling, and any other symptoms.  INTERVAL HISTORY: I connected with [name] on 10/28/2020 by telephone and verified that I am speaking with the correct person using two identifiers.   I discussed the limitations of evaluation and management by telemedicine. The patient expressed understanding and agreed to proceed.   Other persons participating in the visit and their role in the encounter:                                                         -  Reinaldo Raddle, Medical Scribe     Patient's location: Home Provider's location: [facility] is here for f/u today regarding evaluation and management of hx of hairy cell leukemia. The patient's last visit with [place] was on 10/07/2020. The pt reports that he is doing well overall.  The pt reports no new symptoms or concerns.  Lab results 10/07/2020 of CBC w/diff and CMP is as follows: all values are WNL except for Total protein of 8.8. 10/07/2020 Intrinsic Factor of 1.0. 10/07/2020 Parietal Cell Antibody IgG of 0.9. 10/07/2020 Vit B12 of 124. 10/07/2020 JAK2 normal. 10/07/2020 Flow Cytometry  normal.  On review of systems, pt denies any acute symptoms.  MEDICAL HISTORY:  Past Medical History:  Diagnosis Date  . Cervical radiculopathy   . GERD (gastroesophageal reflux disease)   . Hairy cell leukemia ([place]) 2005    Last chemo 2006  . Hepatitis B infection   . Incisional hernia   . Lumbar radiculopathy   . Memory impairment   . Myopathy     SURGICAL HISTORY: Past Surgical History:  Procedure Laterality Date  . HERNIA REPAIR     laparoscopic ventral   . SPLENECTOMY, TOTAL      SOCIAL HISTORY: Social History   Socioeconomic History  . Marital status: Married    Spouse name: Not on file  . Number of children: 3  . Years of education: Not on file  . Highest education level: Not on file  Occupational History  . Occupation: Unemployed  Tobacco Use  . Smoking status: Current Some Day Smoker    Packs/day: 0.50    Types: Cigarettes  . Smokeless tobacco: Never Used  Substance and Sexual Activity  . Alcohol use: No    Alcohol/week: 0.0 standard drinks  . Drug use: No  . Sexual activity: Not on file  Other Topics Concern  . Not on file  Social History Narrative   Some caffeine use    Social Determinants of Health   Financial Resource Strain: Not on file  Food Insecurity: Not on file  Transportation Needs: Not on file  Physical Activity: Not on file  Stress: Not on file  Social Connections: Not on file  Intimate Partner Violence: Not on file    FAMILY HISTORY: Family History  Problem Relation Age of Onset  . Liver cancer Mother   . Diabetes Father   . Liver cancer Brother   . Heart attack Brother 45  . Heart attack Sister 84    ALLERGIES:  is allergic to metrizamide and iodinated diagnostic agents.  MEDICATIONS:  Current Outpatient Medications  Medication Sig Dispense Refill  . Cyanocobalamin (B-12) 1000 MCG SUBL Place 2,000 mcg under the tongue daily. 60 tablet 2  . aspirin EC 81 MG tablet Take 1 tablet (81 mg total) by mouth daily. 90  tablet 3  . traMADol (ULTRAM) 50 MG tablet Take 2 tablets (100 mg total) by mouth 2 (two) times daily. 120 tablet 5  . VIREAD 300 MG tablet Take 300 mg by mouth daily.   11   No current facility-administered medications for this visit.    REVIEW OF SYSTEMS:    10 Point review of Systems was done is negative except as noted above.  PHYSICAL EXAMINATION: ECOG PERFORMANCE STATUS: 0 - Asymptomatic  . There were no vitals filed for this visit. There were no vitals filed for this visit. .There is no height or weight on file to calculate BMI.  Telehealth Visit.  LABORATORY DATA:  I have reviewed the data as listed  . CBC Latest Ref Rng & Units 10/07/2020 03/12/2016 05/06/2013  WBC 4.0 - 10.5 K/uL 7.1 6.5 9.0  Hemoglobin 13.0 - 17.0 g/dL 16.7 13.4 13.7  Hematocrit 39.0 - 52.0 % 48.1 37.1(L) 36.7(L)  Platelets 150 - 400 K/uL 336 325 278   . CBC    Component Value Date/Time   WBC 7.1 10/07/2020 1207   RBC 5.01 10/07/2020 1207   HGB 16.7 10/07/2020 1207   HGB 16.1 04/09/2009 1024   HGB 14.8 09/16/2005 0822   HCT 48.1 10/07/2020 1207   HCT 47.9 04/09/2009 1024   HCT 43.4 09/16/2005 0822   PLT 336 10/07/2020 1207   PLT 265 04/09/2009 1024   PLT 330 09/16/2005 0822   MCV 96.0 10/07/2020 1207   MCV 99 (H) 04/09/2009 1024   MCV 94.8 09/16/2005 0822   MCH 33.3 10/07/2020 1207   MCHC 34.7 10/07/2020 1207   RDW 15.3 10/07/2020 1207   RDW 11.7 04/09/2009 1024   RDW 15.7 (H) 09/16/2005 0822   LYMPHSABS 2.7 10/07/2020 1207   LYMPHSABS 2.6 04/09/2009 1024   LYMPHSABS 1.9 09/16/2005 0822   MONOABS 0.5 10/07/2020 1207   MONOABS 0.9 09/16/2005 0822   EOSABS 0.1 10/07/2020 1207   EOSABS 0.2 04/09/2009 1024   BASOSABS 0.1 10/07/2020 1207   BASOSABS 0.1 04/09/2009 1024   BASOSABS 0.0 09/16/2005 0822    . CMP Latest Ref Rng & Units 10/07/2020 03/12/2016 05/06/2013  Glucose 70 - 99 mg/dL 89 110(H) 122(H)  BUN 8 - 23 mg/dL 13 20 12   Creatinine 0.61 - 1.24 mg/dL 0.81 0.64 0.60  Sodium  135 - 145 mmol/L 140 136 122(L)  Potassium 3.5 - 5.1 mmol/L 3.7 3.8 3.9  Chloride 98 - 111 mmol/L 104 106 87(L)  CO2 22 - 32 mmol/L 27 25 21   Calcium 8.9 - 10.3 mg/dL 9.9 8.8(L) 8.8  Total Protein 6.5 - 8.1 g/dL 8.8(H) 7.6 -  Total Bilirubin 0.3 - 1.2 mg/dL 0.6 12.1(H) -  Alkaline Phos 38 - 126 U/L 119 284(H) -  AST 15 - 41 U/L 25 533(H) -  ALT 0 - 44 U/L 26 444(H) -   10/07/2020 JAK2   10/07/2020 Flow Cytometry - No monoclonal B-cell population or abnormal T-cell phenotype  identified.    RADIOGRAPHIC STUDIES: I have personally reviewed the radiological images as listed and agreed with the findings in the report. No results found.  ASSESSMENT & PLAN:   65 yo with   1) H/o Hairy cell leukemia 2) B12 deficiency 3) h/o Hepatitis B  PLAN: -Discussed pt labwork, 10/07/2020; no concerns for returns of the Hairy Cell Leukemia. Blood counts normal, chemistries stable. -Advised pt that he is still fairly Vitamin B12 deficient.  -Recommended pt use a SL version of the Vitamin B12 and taking 2000 mcg daily. Will Rx. -Advised pt that if B12 levels get too low, this can lead to brain and nerve damage as well as fatigue. -Continue to f/u w PCP regarding Vitamin B12 levels in 3 months to recheck levels to ensure he is getting enough.  -Recommended pt work towards smoking cessation. -Will see back in 12 months with labs.    FOLLOW UP: RTC with Dr Irene Limbo with labs in 12 months  . No orders of the defined types were placed in this encounter.    All of the patients questions were answered with apparent satisfaction. The patient knows to call the clinic with any problems,  questions or concerns.   The total time spent in the appointment was 20 minutes and more than 50% was on counseling and direct patient cares.     Sullivan Lone MD [place] [facility] Hematology/Oncology Physician [facility]  (Office):       [phone] (Work cell):  [phone] (Fax):            [phone]  10/28/2020 3:24 PM  I, Reinaldo Raddle, am acting as scribe for Dr. Sullivan Lone, MD.  .I have reviewed the above documentation for accuracy and completeness, and I agree with the above. Brunetta Genera MD

## 2020-10-28 ENCOUNTER — Inpatient Hospital Stay (HOSPITAL_BASED_OUTPATIENT_CLINIC_OR_DEPARTMENT_OTHER): Payer: Medicare (Managed Care) | Admitting: Hematology

## 2020-10-28 DIAGNOSIS — C9141 Hairy cell leukemia, in remission: Secondary | ICD-10-CM

## 2020-10-28 DIAGNOSIS — E538 Deficiency of other specified B group vitamins: Secondary | ICD-10-CM | POA: Diagnosis not present

## 2020-10-28 MED ORDER — B-12 1000 MCG SL SUBL: 2000.0000 ug | SUBLINGUAL_TABLET | Freq: Every day | SUBLINGUAL | 2 refills | Status: DC

## 2020-10-29 ENCOUNTER — Telehealth: Payer: Self-pay | Admitting: Hematology

## 2020-10-29 NOTE — Telephone Encounter (Signed)
Left message with follow-up appointment per 5/24 los. Gave option to call back to reschedule if needed.

## 2020-11-20 DIAGNOSIS — R059 Cough, unspecified: Secondary | ICD-10-CM | POA: Diagnosis not present

## 2020-11-20 DIAGNOSIS — K219 Gastro-esophageal reflux disease without esophagitis: Secondary | ICD-10-CM | POA: Diagnosis not present

## 2020-11-25 DIAGNOSIS — R059 Cough, unspecified: Secondary | ICD-10-CM | POA: Diagnosis not present

## 2020-11-25 DIAGNOSIS — H9202 Otalgia, left ear: Secondary | ICD-10-CM | POA: Diagnosis not present

## 2020-12-02 ENCOUNTER — Encounter: Payer: Self-pay | Admitting: Nurse Practitioner

## 2020-12-10 DIAGNOSIS — C9111 Chronic lymphocytic leukemia of B-cell type in remission: Secondary | ICD-10-CM | POA: Insufficient documentation

## 2020-12-10 DIAGNOSIS — G64 Other disorders of peripheral nervous system: Secondary | ICD-10-CM | POA: Insufficient documentation

## 2020-12-22 DIAGNOSIS — H66005 Acute suppurative otitis media without spontaneous rupture of ear drum, recurrent, left ear: Secondary | ICD-10-CM | POA: Diagnosis not present

## 2020-12-31 ENCOUNTER — Ambulatory Visit (INDEPENDENT_AMBULATORY_CARE_PROVIDER_SITE_OTHER): Payer: Medicare Other | Admitting: Nurse Practitioner

## 2020-12-31 ENCOUNTER — Other Ambulatory Visit (INDEPENDENT_AMBULATORY_CARE_PROVIDER_SITE_OTHER): Payer: Medicare Other

## 2020-12-31 ENCOUNTER — Encounter: Payer: Self-pay | Admitting: Nurse Practitioner

## 2020-12-31 VITALS — BP 114/70 | HR 82 | Ht 68.0 in | Wt 181.0 lb

## 2020-12-31 DIAGNOSIS — B1910 Unspecified viral hepatitis B without hepatic coma: Secondary | ICD-10-CM

## 2020-12-31 DIAGNOSIS — K7460 Unspecified cirrhosis of liver: Secondary | ICD-10-CM

## 2020-12-31 DIAGNOSIS — E538 Deficiency of other specified B group vitamins: Secondary | ICD-10-CM | POA: Diagnosis not present

## 2020-12-31 DIAGNOSIS — Z1211 Encounter for screening for malignant neoplasm of colon: Secondary | ICD-10-CM

## 2020-12-31 LAB — COMPREHENSIVE METABOLIC PANEL
ALT: 25 U/L (ref 0–53)
AST: 23 U/L (ref 0–37)
Albumin: 4.4 g/dL (ref 3.5–5.2)
Alkaline Phosphatase: 121 U/L — ABNORMAL HIGH (ref 39–117)
BUN: 20 mg/dL (ref 6–23)
CO2: 22 mEq/L (ref 19–32)
Calcium: 9.4 mg/dL (ref 8.4–10.5)
Chloride: 106 mEq/L (ref 96–112)
Creatinine, Ser: 0.93 mg/dL (ref 0.40–1.50)
GFR: 85.99 mL/min (ref >60.00)
Glucose, Bld: 98 mg/dL (ref 70–99)
Potassium: 3.6 mEq/L (ref 3.5–5.1)
Sodium: 138 mEq/L (ref 135–145)
Total Bilirubin: 0.5 mg/dL (ref 0.2–1.2)
Total Protein: 7.8 g/dL (ref 6.0–8.3)

## 2020-12-31 LAB — CBC
HCT: 45.8 % (ref 39.0–52.0)
Hemoglobin: 15.6 g/dL (ref 13.0–17.0)
MCHC: 34.1 g/dL (ref 30.0–36.0)
MCV: 97.5 fl (ref 78.0–100.0)
Platelets: 320 10*3/uL (ref 150.0–400.0)
RBC: 4.7 Mil/uL (ref 4.22–5.81)
RDW: 13.8 % (ref 11.5–15.5)
WBC: 7.6 10*3/uL (ref 4.0–10.5)

## 2020-12-31 LAB — PROTIME-INR
INR: 1.1 ratio — ABNORMAL HIGH (ref 0.8–1.0)
Prothrombin Time: 12.4 s (ref 9.6–13.1)

## 2020-12-31 MED ORDER — VIREAD 300 MG PO TABS: 300.0000 mg | ORAL_TABLET | Freq: Every day | ORAL | 0 refills | Status: DC

## 2020-12-31 NOTE — Progress Notes (Addendum)
12/31/2020 MARUICE MITMAN [id number] 07/21/1955   CHIEF COMPLAINT: Chronic hepatitis B, cirrhosis   HISTORY OF PRESENT ILLNESS:  Zuhayr J. Rodarte is a 65 year old male with a past medical history of hairy cell leukemia initially diagnosed in 2005 with chemo, cervical and lumbar radiculopathy, vitamin B12 deficiency, GERD and chronic hepatitis B with cirrhosis initially diagnosed in 2005 treated with Telbivudine 03/2006 and stopped it 02/2013 due to an increased CPK level then switched to Tenofovir (Viread). S/P splenectomy 06/14/2005 for pancytopenia.  He presents to our office today for further management for chronic hepatitis B cirrhosis.  He was previously followed by gastroenterologist Dr. Adria Dill at [facility] then by Dr. Geanie Logan with [facility] gastroenterology since 2019.  He was recently discharged from [facility] after he missed several follow-up appointments.  He was referred to our office by Dr. Alessandra Bevels for chronic hepatitis C management.  He remains on Viread 300 mg 1 p.o. daily.  He requests a refill of Viread, not generic Tenofovir.  His energy level is good.  He denies having any confusion or mental fogginess.  No history of hepatic encephalopathy.  No pruritus or jaundice.  He denies ever having ascites or lower extremity edema.  No nausea or vomiting.  No GERD symptoms.  He denies ever having a liver biopsy.  He reported undergoing an EGD in [place] 6 or 7 years ago, results are unknown, unlikely had any esophageal varices. I am unable to locate any EGD procedure documents in care everywhere.  He denies ever having a screening colonoscopy.  He underwent a Cologuard test 11/16/2018 which was negative.  He is passing normal formed brown bowel movement daily.  No rectal bleeding or black stools.  No alcohol use.  He avoids NSAIDs.  He takes Tylenol infrequently.  He smokes 1/2 pack of cigarettes daily since the age of 25.  His most recent RUQ sonogram was 09/16/2020  which showed diffuse increased echogenicity of the hepatic parenchyma is a nonspecific indicator of hepatocellular dysfunction/hepatic steatosis.  No evidence of any concerning liver lesions or hepatomas.  His mother and brother both died from liver cancer, they lived in [place] and he is unaware if they had hepatitis B or not.   His hairy cell leukemia has remained in remission.  He was recently seen by hematologist/oncologist Dr. Irene Limbo confirmed his leukemia remains in remission.  He has vitamin B12 deficiency and work-up for that was negative for pernicious anemia.  He was placed on vitamin B12 supplementation with instructions to this managed by his PCP and follow-up with Dr. Irene Limbo in 1 year.   RUQ sonogram 09/16/2020: COMPARISON: Ultrasound abdomen limited 01/15/2020 and 06/29/2019 FINDINGS: Gallbladder:No gallstones or wall thickening visualized. No sonographic Murphy sign noted by sonographer. Common bile duct:Diameter: 4 mm Liver:Diffuse increased parenchymal echogenicity. 8 mm anechoic structure in the left hepatic lobe demonstrates through transmission and is favored to be a simple cyst. Portal vein is patent on color Doppler imaging with normal direction of blood flow towards the liver. IMPRESSION: Diffuse increased echogenicity of the hepatic parenchyma is a nonspecific indicator of hepatocellular dysfunction, most commonly steatosis.   Past Medical History:  Diagnosis Date   Cervical radiculopathy    Cirrhosis ([place])    GERD (gastroesophageal reflux disease)    Hairy cell leukemia ([place]) 2005   Last chemo 2006   Hepatitis B infection    Incisional hernia    Lumbar radiculopathy    Memory impairment    Myopathy  Vitamin D deficiency    Past Surgical History:  Procedure Laterality Date   HERNIA REPAIR     laparoscopic ventral    SPLENECTOMY, TOTAL     Social History: He smokes cigarettes 1 pack/day x 30+ years. No alcohol use. No drug use.   Family History: Father with  history of diabetes.  Mother and brother with history of liver cancer, he is unsure if they had hepatitis or not.  Sister with history of MI.    Allergies  Allergen Reactions   Metrizamide Swelling    Itching and swelling with pre meds in 2005; non ionic cm   Iodinated Diagnostic Agents Swelling and Other (See Comments)    Itching and swelling with pre meds in 2005; non ionic cm      Outpatient Encounter Medications as of 12/31/2020  Medication Sig   aspirin EC 81 MG tablet Take 1 tablet (81 mg total) by mouth daily.   Cyanocobalamin (B-12) 1000 MCG SUBL Place 2,000 mcg under the tongue daily.   traMADol (ULTRAM) 50 MG tablet Take 2 tablets (100 mg total) by mouth 2 (two) times daily.   VIREAD 300 MG tablet Take 300 mg by mouth daily.    No facility-administered encounter medications on file as of 12/31/2020.   REVIEW OF SYSTEMS: Gen: Denies fever, sweats or chills. No weight loss.  CV: Denies chest pain, palpitations or edema. Resp: Denies cough, shortness of breath of hemoptysis.  GI: See HPI.   GU : Denies urinary burning, blood in urine, increased urinary frequency or incontinence. MS: Denies joint pain, muscles aches or weakness. Derm: Denies rash, itchiness, skin lesions or unhealing ulcers. Psych: Denies depression, anxiety or memory loss. Heme: Denies bruising, bleeding. Neuro:  Denies headaches, dizziness or paresthesias. Endo:  Denies any problems with DM, thyroid or adrenal function.  PHYSICAL EXAM: BP 114/70   Pulse 82   Ht '5\' 8"'$  (1.727 m)   Wt 181 lb (82.1 kg)   BMI 27.52 kg/m   General: 65 year old male in no acute distress. Head: Normocephalic and atraumatic. Eyes:  Sclerae non-icteric, conjunctive pink. Ears: Normal auditory acuity. Mouth: Dentition intact. No ulcers or lesions.  Neck: Supple, no lymphadenopathy or thyromegaly.  Lungs: Clear bilaterally to auscultation without wheezes, crackles or rhonchi. Heart: Regular rate and rhythm. No murmur, rub or  gallop appreciated.  Abdomen: Soft, nontender, non distended. No masses.  No ascites no hepatosplenomegaly. Normoactive bowel sounds x 4 quadrants.  Rectal: Deferred. Musculoskeletal: Symmetrical with no gross deformities. Skin: Warm and dry. No rash or lesions on visible extremities.  No jaundice. Extremities: No edema. Neurological: Alert oriented x 4, no focal deficits.  Psychological:  Alert and cooperative. Normal mood and affect.  ASSESSMENT AND PLAN:  67. 65 year old male with chronic hepatitis B with compensated cirrhosis on Viread '300mg'$  po QD. Never had a liver biopsy. Likely had autoimmune liver serologies done during his initial cirrhosis work up but results not accessible in [employer] everywhere. RUQ sono 09/2020 without evidence of any concerning lesions/hepatoma.  -CBC, CMP, INR, Hep B surface antigen. Hep B surface antibody. Hep Be antigen. Hep Be antibody. Hep B DNA quant. Hep C antibody. ANA, AMA, SMA and IgG. AFP. -EGD to survey for esophageal/gastric varices and to assess for atrophic gastritis secondary to vitamin B12 deficiency. EGD benefits and risks discussed including risk with sedation, risk of bleeding, perforation and infection  -Surveillance abdominal imaging due Oct. 2022, to consider an abdominal MRI w/wo contrast to survey for hepatoma  as he is at a higher risk for [facility] due to having chronic hepatitis B and positive family history (mother and brother) with [place]. If MRI unremarkable then RUQ sono Q 6 months thereafter. -Continue Viread '300mg'$  po QD, consider changing to Vemlidy at some point.  Patient is aware to continue Viread daily without interruption.  -Update MELD score once the above laboratory results reviewed  2. Vitamin B 12 deficiency prescribed vitamin B12 2000 mcg daily by hematologist Dr. Irene Limbo with recommendations to have his vitamin B12 levels checked every 3 months by his PCP  3. Colon cancer screening  -Colonoscopy benefits and risks discussed including  risk with sedation, risk of bleeding, perforation and infection   5. History of hairy cell leukemia in remission  6. Chronic smoker -Recommended smoking cessation   Further recommendation to be determined after the above evaluation completed   CC:  Marty Heck, MD

## 2020-12-31 NOTE — Patient Instructions (Addendum)
Your provider has requested that you go to the basement level for lab work before leaving today. Press "B" on the elevator. The lab is located at the first door on the left as you exit the elevator.  Due to recent changes in healthcare laws, you may see the results of your imaging and laboratory studies on MyChart before your provider has had a chance to review them.  We understand that in some cases there may be results that are confusing or concerning to you. Not all laboratory results come back in the same time frame and the provider may be waiting for multiple results in order to interpret others.  Please give [place] 48 hours in order for your provider to thoroughly review all the results before contacting the office for clarification of your results.   It has been recommended to you by your physician that you have a(n) Colonoscopy/EGD completed. Per your request, we did not schedule the procedure(s) today. Please contact our office at [phone] should you decide to have the procedure completed. You will be scheduled for a pre-visit and procedure at that time.  We have sent the following medications to your pharmacy for you to pick up at your convenience: Viread  Take no NSAIDS.  You are due for liver imaging to be done in October 2022.  Please follow up with Dr Wilfrid Lund in 6 months.  I appreciate the opportunity to care for you. Carl Best, CRNP

## 2021-01-01 NOTE — Progress Notes (Signed)
____________________________________________________________  Attending physician addendum:  Thank you for sending this case to me. I have reviewed the entire note and agree with the plan.  Agree with MRI for next [place] screening later this year. EGD for variceal screening. Depending on lab results, we can determine if change to another form of tenofovir and/or hepatology evaluation warranted.  Wilfrid Lund, MD  ____________________________________________________________

## 2021-01-04 LAB — HEPATITIS B SURFACE ANTIBODY,QUALITATIVE: Hep B S Ab: NONREACTIVE

## 2021-01-04 LAB — HEPATITIS B DNA, ULTRAQUANTITATIVE, PCR
Hepatitis B DNA (Calc): <1 Log IU/mL
Hepatitis B DNA: <10 IU/mL

## 2021-01-04 LAB — AFP TUMOR MARKER: AFP-Tumor Marker: 2.2 ng/mL (ref <6.1)

## 2021-01-04 LAB — IGG: IgG (Immunoglobin G), Serum: 1096 mg/dL (ref 600–1540)

## 2021-01-04 LAB — HEPATITIS C ANTIBODY
Hepatitis C Ab: NONREACTIVE
SIGNAL TO CUT-OFF: 0.01 (ref <1.00)

## 2021-01-04 LAB — HEPATITIS A ANTIBODY, TOTAL: Hepatitis A AB,Total: REACTIVE — AB

## 2021-01-04 LAB — ANTI-SMOOTH MUSCLE ANTIBODY, IGG: Actin (Smooth Muscle) Antibody (IGG): <20 U (ref <20)

## 2021-01-04 LAB — HEPATITIS B E ANTIBODY: Hep B E Ab: REACTIVE — AB

## 2021-01-04 LAB — ANA: Anti Nuclear Antibody (ANA): NEGATIVE

## 2021-01-04 LAB — HEPATITIS B SURFACE ANTIGEN: Hepatitis B Surface Ag: REACTIVE — AB

## 2021-01-04 LAB — MITOCHONDRIAL ANTIBODIES: Mitochondrial M2 Ab, IgG: <=20 U

## 2021-01-19 ENCOUNTER — Other Ambulatory Visit: Payer: Self-pay | Admitting: Nurse Practitioner

## 2021-03-02 ENCOUNTER — Telehealth: Payer: Self-pay | Admitting: Nurse Practitioner

## 2021-03-02 NOTE — Telephone Encounter (Signed)
-----   Message from Patti E [place], [place] sent at 12/31/2020  5:53 PM EDT ----- Patient due in October 2022 for abdominal sonogram verses MRI.  Just a friendly reminder. I was with you the day you saw this man. Dx-Hep B cirrhosis

## 2021-03-02 NOTE — Telephone Encounter (Signed)
PJ, excellent follow up, much appreciated.   Beth, can you contact the patient and schedule him for an abdominal MRI with and without IV contrast. He is due for surveillance imaging 03/2021 due to hx of hepatitis B cirrhosis. Pls send him to the lab to have a BMP done 2 to 3 days prior to proceeding with the MRI.   Pls schedule him for a follow up appointment with Dr. Loletha Carrow 2 to 3 weeks after MRI completed. He was also previously advised to schedule an EGD and colonoscopy which he has not done. Dr. Loletha Carrow can review his abd MRI results and further discuss scheduling an EGD and colonoscopy at the time of his follow up appt.  Refer to 12/31/2020 office consult.

## 2021-03-03 ENCOUNTER — Other Ambulatory Visit: Payer: Self-pay

## 2021-03-03 DIAGNOSIS — B181 Chronic viral hepatitis B without delta-agent: Secondary | ICD-10-CM

## 2021-03-03 DIAGNOSIS — B1910 Unspecified viral hepatitis B without hepatic coma: Secondary | ICD-10-CM

## 2021-03-03 DIAGNOSIS — R945 Abnormal results of liver function studies: Secondary | ICD-10-CM

## 2021-03-03 DIAGNOSIS — K7460 Unspecified cirrhosis of liver: Secondary | ICD-10-CM

## 2021-03-03 NOTE — Telephone Encounter (Signed)
[name] The patient refuses MRI due to allergy. He states he would like to have the abdominal u/s again.

## 2021-03-04 ENCOUNTER — Other Ambulatory Visit: Payer: Self-pay

## 2021-03-04 DIAGNOSIS — K7030 Alcoholic cirrhosis of liver without ascites: Secondary | ICD-10-CM

## 2021-03-04 DIAGNOSIS — K7460 Unspecified cirrhosis of liver: Secondary | ICD-10-CM

## 2021-03-04 DIAGNOSIS — B181 Chronic viral hepatitis B without delta-agent: Secondary | ICD-10-CM

## 2021-03-04 DIAGNOSIS — B1910 Unspecified viral hepatitis B without hepatic coma: Secondary | ICD-10-CM

## 2021-03-04 NOTE — Telephone Encounter (Signed)
Spoke with the patient. He will wait for the call to schedule the abdominal u/s complete. His appointment for follow up with Dr Loletha Carrow is scheduled.  We are not having a study with contrast done for the patient. Do you still need the BMP? I have not asked him to come for that assuming it was for the kidney functions a contrasted study would require.. Thanks

## 2021-03-04 NOTE — Telephone Encounter (Signed)
Spoke with the patient. He reports he had a reaction to contrast with a CT and an MRI when he had leukemia. He had rash and swelling. He again declines the imaging with any contrast.

## 2021-03-04 NOTE — Telephone Encounter (Signed)
[name], the patient has listed allergy to Iodinated diagnostic agents used for CT imaging. Pls let him know MRI uses a noniodinated contrast (Gadolinium). Pls verify if he's ever had an MRI study in the past and re-evaluate if he's ever had an adverse reaction to MRI contrast. If not, then I would recommend scheduling him for a surveillance abdominal MRI with and without Gadolinium to thoroughly evaluate his liver. Let me know outcome. Thx

## 2021-03-04 NOTE — Telephone Encounter (Signed)
Beth, thank you for the clarification. Ok to order a surveillance complete abdominal sonogram.

## 2021-03-06 NOTE — Telephone Encounter (Signed)
Understood and agree  - HD

## 2021-03-10 ENCOUNTER — Ambulatory Visit (HOSPITAL_BASED_OUTPATIENT_CLINIC_OR_DEPARTMENT_OTHER): Payer: Medicare Other

## 2021-03-17 ENCOUNTER — Ambulatory Visit (HOSPITAL_BASED_OUTPATIENT_CLINIC_OR_DEPARTMENT_OTHER)
Admission: RE | Admit: 2021-03-17 | Discharge: 2021-03-17 | Disposition: A | Discharge status: Home / Self Care | Payer: Medicare Other | Source: Ambulatory Visit | Attending: Nurse Practitioner | Admitting: Nurse Practitioner

## 2021-03-17 ENCOUNTER — Other Ambulatory Visit: Payer: Self-pay

## 2021-03-17 DIAGNOSIS — Z9081 Acquired absence of spleen: Secondary | ICD-10-CM | POA: Diagnosis not present

## 2021-03-17 DIAGNOSIS — K7460 Unspecified cirrhosis of liver: Secondary | ICD-10-CM | POA: Diagnosis not present

## 2021-03-17 DIAGNOSIS — N281 Cyst of kidney, acquired: Secondary | ICD-10-CM | POA: Diagnosis not present

## 2021-03-17 DIAGNOSIS — B1910 Unspecified viral hepatitis B without hepatic coma: Secondary | ICD-10-CM | POA: Diagnosis not present

## 2021-03-24 ENCOUNTER — Encounter: Payer: Medicare Other | Attending: Registered Nurse | Admitting: Registered Nurse

## 2021-03-24 ENCOUNTER — Other Ambulatory Visit: Payer: Self-pay

## 2021-03-24 ENCOUNTER — Encounter: Payer: Self-pay | Admitting: Registered Nurse

## 2021-03-24 ENCOUNTER — Other Ambulatory Visit: Payer: Self-pay | Admitting: Physical Medicine & Rehabilitation

## 2021-03-24 VITALS — BP 130/86 | HR 69 | Temp 98.0°F | Wt 183.4 lb

## 2021-03-24 DIAGNOSIS — Z5181 Encounter for therapeutic drug level monitoring: Secondary | ICD-10-CM | POA: Diagnosis not present

## 2021-03-24 DIAGNOSIS — M48061 Spinal stenosis, lumbar region without neurogenic claudication: Secondary | ICD-10-CM | POA: Diagnosis not present

## 2021-03-24 DIAGNOSIS — G8929 Other chronic pain: Secondary | ICD-10-CM

## 2021-03-24 DIAGNOSIS — M5416 Radiculopathy, lumbar region: Secondary | ICD-10-CM

## 2021-03-24 DIAGNOSIS — G894 Chronic pain syndrome: Secondary | ICD-10-CM | POA: Diagnosis not present

## 2021-03-24 DIAGNOSIS — Z79899 Other long term (current) drug therapy: Secondary | ICD-10-CM

## 2021-03-24 DIAGNOSIS — M25511 Pain in right shoulder: Secondary | ICD-10-CM

## 2021-03-24 MED ORDER — TRAMADOL HCL 50 MG PO TABS: 100.0000 mg | ORAL_TABLET | Freq: Two times a day (BID) | ORAL | 5 refills | Status: DC

## 2021-03-24 NOTE — Progress Notes (Signed)
Subjective:    Patient ID: [name], male    DOB: August 12, 1955, 65 y.o.   MRN: [id number]  HPI: [name] is a 65 y.o. male who returns for follow up appointment for chronic pain and medication refill. He states his  pain is located in his right shoulder and lower back pain  radiating into his right lower extremity.He rates his pain 5. His current exercise regime is walking and performing stretching exercises.  [name] Morphine equivalent is 20.00 MME.   Last UDS was Performed on 09/26/2020,it was consistent.     Pain Inventory Average Pain 6 Pain Right Now 5 My pain is intermittent and sharp  In the last 24 hours, has pain interfered with the following? General activity 5 Relation with others 4 Enjoyment of life 9 What TIME of day is your pain at its worst? evening Sleep (in general) Fair  Pain is worse with: walking and standing Pain improves with: rest and medication Relief from Meds: 5  Family History  Problem Relation Age of Onset   Liver cancer Mother    Diabetes Father    Liver cancer Brother    Heart attack Brother 17   Heart attack Sister 4   Social History   Socioeconomic History   Marital status: Married    Spouse name: Not on file   Number of children: 3   Years of education: Not on file   Highest education level: Not on file  Occupational History   Occupation: Unemployed  Tobacco Use   Smoking status: Some Days    Packs/day: 0.50    Types: Cigarettes   Smokeless tobacco: Never  Vaping Use   Vaping Use: Never used  Substance and Sexual Activity   Alcohol use: No    Alcohol/week: 0.0 standard drinks   Drug use: No   Sexual activity: Not on file  Other Topics Concern   Not on file  Social History Narrative   Some caffeine use    Social Determinants of Health   Financial Resource Strain: Not on file  Food Insecurity: Not on file  Transportation Needs: Not on file  Physical Activity: Not on file  Stress: Not on file   Social Connections: Not on file   Past Surgical History:  Procedure Laterality Date   HERNIA REPAIR     laparoscopic ventral    SPLENECTOMY, TOTAL     Past Surgical History:  Procedure Laterality Date   HERNIA REPAIR     laparoscopic ventral    SPLENECTOMY, TOTAL     Past Medical History:  Diagnosis Date   Cervical radiculopathy    Cirrhosis ([place])    GERD (gastroesophageal reflux disease)    Hairy cell leukemia ([place]) 2005   Last chemo 2006   Hepatitis B infection    Incisional hernia    Lumbar radiculopathy    Memory impairment    Myopathy    Vitamin D deficiency    Temp 98 F (36.7 C)   Wt 183 lb 6.4 oz (83.2 kg)   BMI 27.89 kg/m   Opioid Risk Score:   Fall Risk Score:  `1  Depression screen PHQ 2/9  Depression screen [facility] 2/9 03/24/2021 04/08/2016 10/06/2015  Decreased Interest 0 3 3  Down, Depressed, Hopeless 0 0 0  PHQ - 2 Score 0 3 3  Altered sleeping - - 0  Tired, decreased energy - - 1  Change in appetite - - 1  Feeling bad or failure about yourself  - -  1  Trouble concentrating - - 1  Moving slowly or fidgety/restless - - 0  Suicidal thoughts - - 0  PHQ-9 Score - - 7     Review of Systems  Constitutional: Negative.   HENT: Negative.    Eyes: Negative.   Respiratory: Negative.    Cardiovascular: Negative.   Gastrointestinal: Negative.   Endocrine: Negative.   Genitourinary: Negative.   Musculoskeletal: Negative.   Skin: Negative.   Allergic/Immunologic: Negative.   Neurological: Negative.   Hematological: Negative.   Psychiatric/Behavioral: Negative.        Objective:   Physical Exam Vitals and nursing note reviewed.  Constitutional:      Appearance: Normal appearance.  Cardiovascular:     Rate and Rhythm: Normal rate and regular rhythm.     Pulses: Normal pulses.     Heart sounds: Normal heart sounds.  Pulmonary:     Effort: Pulmonary effort is normal.     Breath sounds: Normal breath sounds.  Musculoskeletal:     Cervical back:  Normal range of motion and neck supple.     Comments: Normal Muscle Bulk and Muscle Testing Reveals:  Upper Extremities: Full ROM and Muscle Strength 5/5 Lumbar Paraspinal Tenderness: L-3-L-5 Lower Extremities: Full ROM and Muscle Strength 5/5 Arises from Table with ease Narrow Based  Gait     Skin:    General: Skin is warm and dry.  Neurological:     Mental Status: He is alert and oriented to person, place, and time.  Psychiatric:        Mood and Affect: Mood normal.        Behavior: Behavior normal.         Assessment & Plan:  1. Lumbar Degenerative Disc with Lumbar Radiculitis: Continue HEP as Tolerated. 03/24/21 Refilled:Tramadol 50 mg take two tablets twice a day as needed for pain #120.  We will continue the opioid monitoring program, this consists of regular clinic visits, examinations, urine drug screen, pill counts as well as use of [place] Controlled Substance Reporting system. A 12 month History has been reviewed on the [place] on 03/24/2021. 2. Chronic Right Shoulder Pain:  Continue HEP as Tolerated. Continue to Monitor. 03/24/2021   Return in 6 months

## 2021-04-07 ENCOUNTER — Other Ambulatory Visit: Payer: Self-pay | Admitting: Nurse Practitioner

## 2021-04-08 ENCOUNTER — Telehealth: Payer: Self-pay

## 2021-04-08 NOTE — Telephone Encounter (Signed)
Patient insurance does not cover Viread, plan preferred alternative is Tenofovir tab 300 MG.  Please advise if ok to change or need to do PA.

## 2021-04-09 NOTE — Telephone Encounter (Signed)
Viread is a brand name for tenofovir.  They are the same medicine.  So the patient can be prescribed tenofovir 300 mg once daily.  - HD

## 2021-04-10 MED ORDER — TENOFOVIR DISOPROXIL FUMARATE 300 MG PO TABS: 300.0000 mg | ORAL_TABLET | Freq: Every day | ORAL | 3 refills | Status: DC

## 2021-04-10 NOTE — Telephone Encounter (Signed)
RX changed to generic and resent.

## 2021-04-10 NOTE — Telephone Encounter (Signed)
[facility] called wanting to know if we received a faxed authorization for the Vired, 300 mg.  Please call [phone], Ref # [id number]

## 2021-04-17 ENCOUNTER — Encounter: Payer: Self-pay | Admitting: Gastroenterology

## 2021-04-17 ENCOUNTER — Ambulatory Visit (INDEPENDENT_AMBULATORY_CARE_PROVIDER_SITE_OTHER): Payer: Medicare Other | Admitting: Gastroenterology

## 2021-04-17 VITALS — BP 120/64 | HR 64 | Ht 68.0 in | Wt 184.4 lb

## 2021-04-17 DIAGNOSIS — K7460 Unspecified cirrhosis of liver: Secondary | ICD-10-CM

## 2021-04-17 DIAGNOSIS — B181 Chronic viral hepatitis B without delta-agent: Secondary | ICD-10-CM

## 2021-04-17 DIAGNOSIS — B1910 Unspecified viral hepatitis B without hepatic coma: Secondary | ICD-10-CM

## 2021-04-17 DIAGNOSIS — Z800 Family history of malignant neoplasm of digestive organs: Secondary | ICD-10-CM | POA: Diagnosis not present

## 2021-04-17 NOTE — Patient Instructions (Signed)
You have been scheduled for an endoscopy. Please follow written instructions given to you at your visit today. If you use inhalers (even only as needed), please bring them with you on the day of your procedure. ______________________________________________________  You have been scheduled for an MRI at [facility] on 04-24-2021. Your appointment time is 9am. Please arrive to admitting (at main entrance of the hospital) 30 minutes prior to your appointment time for registration purposes. Please make certain not to have anything to eat or drink 6 hours prior to your test. In addition, if you have any metal in your body, have a pacemaker or defibrillator, please be sure to let your ordering physician know. This test typically takes 45 minutes to 1 hour to complete. Should you need to reschedule, please call [phone] to do so.  If you are age 58 or older, your body mass index should be between 23-30. Your Body mass index is 28.04 kg/m. If this is out of the aforementioned range listed, please consider follow up with your Primary Care Provider.  If you are age 38 or younger, your body mass index should be between 19-25. Your Body mass index is 28.04 kg/m. If this is out of the aformentioned range listed, please consider follow up with your Primary Care Provider.   ________________________________________________________  The [facility] providers would like to encourage you to use [facility] to communicate with providers for non-urgent requests or questions.  Due to long hold times on the telephone, sending your provider a message by [facility] may be a faster and more efficient way to get a response.  Please allow 48 business hours for a response.  Please remember that this is for non-urgent requests.  _______________________________________________________  Due to recent changes in healthcare laws, you may see the results of your imaging and laboratory studies on MyChart before your provider has had a  chance to review them.  We understand that in some cases there may be results that are confusing or concerning to you. Not all laboratory results come back in the same time frame and the provider may be waiting for multiple results in order to interpret others.  Please give [place] 48 hours in order for your provider to thoroughly review all the results before contacting the office for clarification of your results.  _______________________________________________________  It was a pleasure to see you today!  Thank you for trusting me with your gastrointestinal care!

## 2021-04-17 NOTE — Progress Notes (Addendum)
[facility] Progress Note  Chief Complaint: Chronic hepatitis B virus infection  Subjective  History: [name] was here for initial office consult in July and seen by one of our APPs after previously being seen by and discharged from the [facility] (extensive clinical details included in office consult note). He reportedly has cirrhosis from chronic hepatitis B virus infection on long-term tenofovir, he also need an upper endoscopy for variceal screening as well as hepatic MRI for hepatoma screening (family history of hepatoma as well).  Negative Cologuard June 2020 He was insurance company recently denied the brand name Viread he had been taking, so I changed it to generic tenofovir or 300 mg once daily.  [name] has not yet picked up his new prescription because he did not know it was ready, and he had a few tablets of the old prescription left.  He has been feeling well since his last clinic visit here, denies abdominal pain, nausea vomiting or new digestive symptoms. He thinks that at some point in the past he may have taken a generic form of one HBV medication but it gave him digestive upset.  He is willing to try the generic tenofovir and see how he tolerates it and let me know if otherwise.  ROS: Cardiovascular:  no chest pain Respiratory: no dyspnea Remainder of systems negative except as above The patient's Past Medical, Family and Social History were reviewed and are on file in the EMR.  Objective:  Med list reviewed  Current Outpatient Medications:    tenofovir (VIREAD) 300 MG tablet, Take 1 tablet (300 mg total) by mouth daily., Disp: 30 tablet, Rfl: 3   traMADol (ULTRAM) 50 MG tablet, Take 2 tablets (100 mg total) by mouth 2 (two) times daily., Disp: 120 tablet, Rfl: 5 Social history: No alcohol use  Originally from [place].  He believes his mother may have had hepatitis, and both she and her brother died from liver cancer.  Vital signs in last 24 hrs: Vitals:    04/17/21 0928  BP: 120/64  Pulse: 64  SpO2: 99%   Wt Readings from Last 3 Encounters:  04/17/21 184 lb 6.4 oz (83.6 kg)  03/24/21 183 lb 6.4 oz (83.2 kg)  12/31/20 181 lb (82.1 kg)    Physical Exam  Well-appearing HEENT: sclera anicteric, oral mucosa moist without lesions Neck: supple, no thyromegaly, JVD or lymphadenopathy Cardiac: RRR without murmurs, S1S2 heard, no peripheral edema Pulm: clear to auscultation bilaterally, normal RR and effort noted Abdomen: soft, no tenderness, with active bowel sounds. No guarding or palpable hepatosplenomegaly.  Long midline scar (from previous splenectomy when diagnosed with lymphoma) he seems to have a left upper quadrant abdominal wall hernia Skin; warm and dry, no jaundice or rash  Labs:  CBC Latest Ref Rng & Units 12/31/2020 10/07/2020 03/12/2016  WBC 4.0 - 10.5 K/uL 7.6 7.1 6.5  Hemoglobin 13.0 - 17.0 g/dL 15.6 16.7 13.4  Hematocrit 39.0 - 52.0 % 45.8 48.1 37.1(L)  Platelets 150.0 - 400.0 K/uL 320.0 336 325   CMP Latest Ref Rng & Units 12/31/2020 10/07/2020 03/12/2016  Glucose 70 - 99 mg/dL 98 89 110(H)  BUN 6 - 23 mg/dL 20 13 20   Creatinine 0.40 - 1.50 mg/dL 0.93 0.81 0.64  Sodium 135 - 145 mEq/L 138 140 136  Potassium 3.5 - 5.1 mEq/L 3.6 3.7 3.8  Chloride 96 - 112 mEq/L 106 104 106  CO2 19 - 32 mEq/L 22 27 25   Calcium 8.4 - 10.5  mg/dL 9.4 9.9 8.8(L)  Total Protein 6.0 - 8.3 g/dL 7.8 8.8(H) 7.6  Total Bilirubin 0.2 - 1.2 mg/dL 0.5 0.6 12.1(H)  Alkaline Phos 39 - 117 U/L 121(H) 119 284(H)  AST 0 - 37 U/L 23 25 533(H)  ALT 0 - 53 U/L 25 26 444(H)   Lab Results  Component Value Date   INR 1.1 (H) 12/31/2020   INR 1.1 11/06/2008   AFP 2.2 on 12/31/2020  He is hepatitis A immune  Hepatitis B surface antigen positive, surface antibody negative, aAb positive, and undetected viral load when seen here in July  (2013 labs showed e antibody positive, e antigen negative) Hepatitis C antibody  negative  ___________________________________________ Radiologic studies:   ____________________________________________ Other:   _____________________________________________ Assessment & Plan  Assessment: Encounter Diagnoses  Name Primary?   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 Cirrhosis from hepatitis B virus evidenced on previous biopsy and imaging.  Last AFP normal, needs up-to-date imaging.  Given his increased risk with family history, I recommend MRI of the liver.  (He has a documented reaction to iodinated CT contrast dye). Does not affect his management, and there is no way to be certain, but based on his personal and family history, I wonder if he may have had vertical transmission with latent hepatitis B that was reactivated by lymphoma treatment.  He needs an upper endoscopy to screen for varices, last having been done 6 to 7 years ago in [place] by his report.  He was agreeable after discussion of procedure and risks.   34 minutes were spent on this encounter (including chart review, history/exam, counseling/coordination of care, and documentation) > 50% of that time was spent on counseling and coordination of care.   Nelida Meuse III

## 2021-04-24 ENCOUNTER — Ambulatory Visit (HOSPITAL_COMMUNITY): Admission: RE | Admit: 2021-04-24 | Payer: Medicare Other | Source: Ambulatory Visit

## 2021-05-05 ENCOUNTER — Telehealth: Payer: Self-pay | Admitting: Gastroenterology

## 2021-05-05 ENCOUNTER — Other Ambulatory Visit: Payer: Self-pay

## 2021-05-05 MED ORDER — VIREAD 150 MG PO TABS: ORAL_TABLET | ORAL | 3 refills | Status: DC

## 2021-05-05 NOTE — Telephone Encounter (Signed)
Inbound call from patient states the medication Viread is making him sick and is requesting an alternative.

## 2021-05-05 NOTE — Telephone Encounter (Signed)
He was recently changed to generic tenofovir for insurance reasons.  He was tolerating name brand Viread before the change to generic, so he needs a prior authorization done for Viread at the same dose.  I need to know if that is declined.  - HD

## 2021-05-05 NOTE — Telephone Encounter (Signed)
Left message to return call 

## 2021-05-05 NOTE — Telephone Encounter (Signed)
Returning call. I asked him what his symptoms are and he said that it makes him dizzy and his stomach gets upset.

## 2021-05-05 NOTE — Telephone Encounter (Signed)
The Name brand Viread was not covered at 300 mg tablets. [name] says the generic just doesn't settle well with him.  We have sent in the 150 mg tablets (2 once a day to equal 300mg ) at name brand and patient aware of this. He will let me know if he has trouble getting this RX Filled.

## 2021-05-12 NOTE — Telephone Encounter (Signed)
[facility] need PA for Viread. Best contact number [phone]

## 2021-05-13 ENCOUNTER — Telehealth: Payer: Self-pay | Admitting: Gastroenterology

## 2021-05-13 NOTE — Telephone Encounter (Signed)
Inbound call from [facility], spoke with Helene Kelp. Calling to check on pre authorization on Viread. Please advise   [phone]

## 2021-05-13 NOTE — Telephone Encounter (Signed)
Please see other phone communication for detailed notes

## 2021-05-13 NOTE — Telephone Encounter (Signed)
Local pharmacy changed the RX to 300mg  tablets of name brand Viread and this is not covered. But, the 150 mg tablets are covered. So they will change the Rx

## 2021-05-14 ENCOUNTER — Telehealth: Payer: Self-pay | Admitting: Gastroenterology

## 2021-05-14 NOTE — Telephone Encounter (Signed)
Understood - thank you for the note.  - HD

## 2021-05-14 NOTE — Telephone Encounter (Signed)
Good Morning Dr. Loletha Carrow,  Patient called and canceled his EGD for 12/14 at 10:00 due to a [place] emergency [place] of [place]. Will call back at a later time to reschedule.

## 2021-05-19 NOTE — Telephone Encounter (Signed)
Viread has been approved from 05-11-2021 until further notice.

## 2021-05-20 ENCOUNTER — Encounter: Payer: Medicare Other | Admitting: Gastroenterology

## 2021-09-14 ENCOUNTER — Encounter: Payer: Self-pay | Admitting: Registered Nurse

## 2021-09-14 ENCOUNTER — Encounter: Payer: Medicare Other | Attending: Registered Nurse | Admitting: Registered Nurse

## 2021-09-14 VITALS — Ht 68.0 in | Wt 182.0 lb

## 2021-09-14 DIAGNOSIS — Z79891 Long term (current) use of opiate analgesic: Secondary | ICD-10-CM | POA: Diagnosis not present

## 2021-09-14 DIAGNOSIS — G8929 Other chronic pain: Secondary | ICD-10-CM | POA: Diagnosis not present

## 2021-09-14 DIAGNOSIS — G894 Chronic pain syndrome: Secondary | ICD-10-CM | POA: Insufficient documentation

## 2021-09-14 DIAGNOSIS — M25511 Pain in right shoulder: Secondary | ICD-10-CM | POA: Insufficient documentation

## 2021-09-14 DIAGNOSIS — M5416 Radiculopathy, lumbar region: Secondary | ICD-10-CM | POA: Insufficient documentation

## 2021-09-14 DIAGNOSIS — Z5181 Encounter for therapeutic drug level monitoring: Secondary | ICD-10-CM | POA: Insufficient documentation

## 2021-09-14 MED ORDER — TRAMADOL HCL 50 MG PO TABS: 100.0000 mg | ORAL_TABLET | Freq: Two times a day (BID) | ORAL | 5 refills | Status: DC

## 2021-09-14 NOTE — Progress Notes (Signed)
? ?Subjective:  ? ? Patient ID: [name], male    DOB: 1955-11-28, 66 y.o.   MRN: [id number] ? ?HPI: [name] is a 66 y.o. male who returns for follow up appointment for chronic pain and medication refill. He states his pain is located in his right shoulder and lower back pain radiating into her right lower extremity. He rates his pain 4. His current exercise regime is walking and performing stretching exercises. ? ?Mr. Schoof Morphine equivalent is 20.00 MME.   UDS ordered today.  ? ?Vitals: 139/90 P 67 Oxygen Saturation: 98%  ? ?Pain Inventory ?Average Pain 6 ?Pain Right Now 4 ?My pain is sharp ? ?In the last 24 hours, has pain interfered with the following? ?General activity 2 ?Relation with others 3 ?Enjoyment of life 5 ?What TIME of day is your pain at its worst? night ?Sleep (in general) Fair ? ?Pain is worse with: walking and standing ?Pain improves with: rest ?Relief from Meds: 7 ? ?Family History  ?Problem Relation Age of Onset  ? Liver cancer Mother   ? Diabetes Father   ? Liver cancer Brother   ? Heart attack Brother 89  ? Heart attack Sister 77  ? ?Social History  ? ?Socioeconomic History  ? Marital status: Married  ?  Spouse name: Not on file  ? Number of children: 3  ? Years of education: Not on file  ? Highest education level: Not on file  ?Occupational History  ? Occupation: Unemployed  ?Tobacco Use  ? Smoking status: Some Days  ?  Packs/day: 0.50  ?  Types: Cigarettes  ? Smokeless tobacco: Never  ?Vaping Use  ? Vaping Use: Never used  ?Substance and Sexual Activity  ? Alcohol use: No  ?  Alcohol/week: 0.0 standard drinks  ? Drug use: No  ? Sexual activity: Not Currently  ?Other Topics Concern  ? Not on file  ?Social History Narrative  ? Some caffeine use   ? ?Social Determinants of Health  ? ?Financial Resource Strain: Not on file  ?Food Insecurity: Not on file  ?Transportation Needs: Not on file  ?Physical Activity: Not on file  ?Stress: Not on file  ?Social Connections: Not  on file  ? ?Past Surgical History:  ?Procedure Laterality Date  ? HERNIA REPAIR    ? laparoscopic ventral   ? SPLENECTOMY, TOTAL    ? ?Past Surgical History:  ?Procedure Laterality Date  ? HERNIA REPAIR    ? laparoscopic ventral   ? SPLENECTOMY, TOTAL    ? ?Past Medical History:  ?Diagnosis Date  ? Cervical radiculopathy   ? Cirrhosis ([place])   ? GERD (gastroesophageal reflux disease)   ? Hairy cell leukemia ([place]) 2005  ? Last chemo 2006  ? Hepatitis B infection   ? Incisional hernia   ? Lumbar radiculopathy   ? Memory impairment   ? Myopathy   ? Vitamin D deficiency   ? ?There were no vitals taken for this visit. ? ?Opioid Risk Score:   ?Fall Risk Score:  `1 ? ?Depression screen PHQ 2/9 ? ? ?  03/24/2021  ? 10:17 AM 04/08/2016  ? 10:17 AM 10/06/2015  ?  2:10 PM  ?Depression screen PHQ 2/9  ?Decreased Interest 0 3 3  ?Down, Depressed, Hopeless 0 0 0  ?PHQ - 2 Score 0 3 3  ?Altered sleeping   0  ?Tired, decreased energy   1  ?Change in appetite   1  ?Feeling bad or failure about  yourself    1  ?Trouble concentrating   1  ?Moving slowly or fidgety/restless   0  ?Suicidal thoughts   0  ?PHQ-9 Score   7  ?  ? ?Review of Systems  ?Musculoskeletal:   ?     Whole right side pain  ?All other systems reviewed and are negative. ? ?   ?Objective:  ? Physical Exam ?Vitals and nursing note reviewed.  ?Constitutional:   ?   Appearance: Normal appearance.  ?Cardiovascular:  ?   Rate and Rhythm: Normal rate and regular rhythm.  ?   Pulses: Normal pulses.  ?   Heart sounds: Normal heart sounds.  ?Pulmonary:  ?   Effort: Pulmonary effort is normal.  ?   Breath sounds: Normal breath sounds.  ?Musculoskeletal:  ?   Cervical back: Normal range of motion and neck supple.  ?   Comments: Normal Muscle Bulk and Muscle Testing Reveals:  ?Upper Extremities: Full ROM and Muscle Strength  5/5 ?Lumbar Paraspinal Tenderness: L-4-L-5 ?Lower Extremities: Full ROM and Muscle Strength 5/5 ?Arises from chair with ease  ?Narrow Based Gait  ?   ?Skin: ?    General: Skin is warm and dry.  ?Neurological:  ?   Mental Status: He is alert and oriented to person, place, and time.  ?Psychiatric:     ?   Mood and Affect: Mood normal.     ?   Behavior: Behavior normal.  ? ? ? ? ?   ?Assessment & Plan:  ?1. Lumbar Degenerative Disc with Lumbar Radiculitis: Continue HEP as Tolerated. 09/14/2021 ?Refilled:Tramadol 50 mg take two tablets twice a day as needed for pain #120.  ?We will continue the opioid monitoring program, this consists of regular clinic visits, examinations, urine drug screen, pill counts as well as use of [place] Controlled Substance Reporting system. A 12 month History has been reviewed on the [place] Controlled Substance Reporting System on 09/14/2021. ?2. Chronic Right Shoulder Pain: Continue HEP as Tolerated. Continue to Monitor. 09/14/2021 ?  ?Return in 6 months  ? ?

## 2021-09-18 LAB — TOXASSURE SELECT,+ANTIDEPR,UR

## 2021-09-24 ENCOUNTER — Telehealth: Payer: Self-pay | Admitting: *Deleted

## 2021-09-24 ENCOUNTER — Telehealth: Payer: Self-pay | Admitting: Hematology

## 2021-09-24 NOTE — Telephone Encounter (Signed)
Urine drug screen for this encounter is consistent for prescribed medication 

## 2021-09-24 NOTE — Telephone Encounter (Signed)
Per provider reschedule called and spoke to pt about appointment change.  Pt confirmed appointment  ?

## 2021-10-06 DIAGNOSIS — H5712 Ocular pain, left eye: Secondary | ICD-10-CM | POA: Diagnosis not present

## 2021-10-06 DIAGNOSIS — H5789 Other specified disorders of eye and adnexa: Secondary | ICD-10-CM | POA: Diagnosis not present

## 2021-10-16 ENCOUNTER — Telehealth: Payer: Self-pay

## 2021-10-16 NOTE — Telephone Encounter (Signed)
PA Case: [id number], Status: Approved, Coverage Starts on: 06/07/2021 12:00:00 AM, Coverage Ends on: 06/06/2022 12:00:00 AM. Questions? Contact [phone]. ?

## 2021-10-16 NOTE — Telephone Encounter (Signed)
PA for Tramadol sent to insurance through CoverMyMeds 

## 2021-10-28 ENCOUNTER — Other Ambulatory Visit: Payer: Medicare (Managed Care)

## 2021-10-28 ENCOUNTER — Other Ambulatory Visit: Payer: Self-pay

## 2021-10-28 ENCOUNTER — Ambulatory Visit: Payer: Medicare (Managed Care) | Admitting: Hematology

## 2021-10-28 DIAGNOSIS — C9141 Hairy cell leukemia, in remission: Secondary | ICD-10-CM

## 2021-10-29 ENCOUNTER — Inpatient Hospital Stay: Payer: Medicare HMO | Attending: Hematology

## 2021-10-29 ENCOUNTER — Inpatient Hospital Stay (HOSPITAL_BASED_OUTPATIENT_CLINIC_OR_DEPARTMENT_OTHER): Payer: Medicare HMO | Admitting: Hematology

## 2021-10-29 ENCOUNTER — Other Ambulatory Visit: Payer: Self-pay

## 2021-10-29 VITALS — BP 135/76 | HR 74 | Temp 97.5°F | Resp 20 | Wt 182.0 lb

## 2021-10-29 DIAGNOSIS — F17210 Nicotine dependence, cigarettes, uncomplicated: Secondary | ICD-10-CM | POA: Insufficient documentation

## 2021-10-29 DIAGNOSIS — K7460 Unspecified cirrhosis of liver: Secondary | ICD-10-CM | POA: Diagnosis not present

## 2021-10-29 DIAGNOSIS — C9141 Hairy cell leukemia, in remission: Secondary | ICD-10-CM

## 2021-10-29 DIAGNOSIS — E538 Deficiency of other specified B group vitamins: Secondary | ICD-10-CM | POA: Diagnosis not present

## 2021-10-29 DIAGNOSIS — Z808 Family history of malignant neoplasm of other organs or systems: Secondary | ICD-10-CM | POA: Insufficient documentation

## 2021-10-29 DIAGNOSIS — B1910 Unspecified viral hepatitis B without hepatic coma: Secondary | ICD-10-CM | POA: Insufficient documentation

## 2021-10-29 LAB — CBC WITH DIFFERENTIAL (CANCER CENTER ONLY)
Abs Immature Granulocytes: 0.01 10*3/uL (ref 0.00–0.07)
Basophils Absolute: 0.1 10*3/uL (ref 0.0–0.1)
Basophils Relative: 1 %
Eosinophils Absolute: 0.1 10*3/uL (ref 0.0–0.5)
Eosinophils Relative: 2 %
HCT: 47.5 % (ref 39.0–52.0)
Hemoglobin: 16.8 g/dL (ref 13.0–17.0)
Immature Granulocytes: 0 %
Lymphocytes Relative: 40 %
Lymphs Abs: 2.7 10*3/uL (ref 0.7–4.0)
MCH: 34.3 pg — ABNORMAL HIGH (ref 26.0–34.0)
MCHC: 35.4 g/dL (ref 30.0–36.0)
MCV: 96.9 fL (ref 80.0–100.0)
Monocytes Absolute: 0.5 10*3/uL (ref 0.1–1.0)
Monocytes Relative: 7 %
Neutro Abs: 3.3 10*3/uL (ref 1.7–7.7)
Neutrophils Relative %: 50 %
Platelet Count: 330 10*3/uL (ref 150–400)
RBC: 4.9 MIL/uL (ref 4.22–5.81)
RDW: 14.9 % (ref 11.5–15.5)
WBC Count: 6.6 10*3/uL (ref 4.0–10.5)
nRBC: 0 % (ref 0.0–0.2)

## 2021-10-29 LAB — CMP (CANCER CENTER ONLY)
ALT: 22 U/L (ref 0–44)
AST: 23 U/L (ref 15–41)
Albumin: 4.6 g/dL (ref 3.5–5.0)
Alkaline Phosphatase: 130 U/L — ABNORMAL HIGH (ref 38–126)
Anion gap: 6 (ref 5–15)
BUN: 17 mg/dL (ref 8–23)
CO2: 28 mmol/L (ref 22–32)
Calcium: 9.5 mg/dL (ref 8.9–10.3)
Chloride: 105 mmol/L (ref 98–111)
Creatinine: 0.89 mg/dL (ref 0.61–1.24)
GFR, Estimated: >60 mL/min (ref >60)
Glucose, Bld: 91 mg/dL (ref 70–99)
Potassium: 3.8 mmol/L (ref 3.5–5.1)
Sodium: 139 mmol/L (ref 135–145)
Total Bilirubin: 0.4 mg/dL (ref 0.3–1.2)
Total Protein: 8 g/dL (ref 6.5–8.1)

## 2021-10-29 LAB — LACTATE DEHYDROGENASE: LDH: 144 U/L (ref 98–192)

## 2021-10-30 ENCOUNTER — Other Ambulatory Visit (INDEPENDENT_AMBULATORY_CARE_PROVIDER_SITE_OTHER): Payer: Medicare HMO

## 2021-10-30 ENCOUNTER — Encounter: Payer: Self-pay | Admitting: Gastroenterology

## 2021-10-30 ENCOUNTER — Ambulatory Visit (INDEPENDENT_AMBULATORY_CARE_PROVIDER_SITE_OTHER): Payer: Medicare HMO | Admitting: Gastroenterology

## 2021-10-30 VITALS — BP 126/80 | HR 72 | Ht 68.0 in | Wt 181.0 lb

## 2021-10-30 DIAGNOSIS — B181 Chronic viral hepatitis B without delta-agent: Secondary | ICD-10-CM | POA: Diagnosis not present

## 2021-10-30 DIAGNOSIS — K7460 Unspecified cirrhosis of liver: Secondary | ICD-10-CM | POA: Diagnosis not present

## 2021-10-30 DIAGNOSIS — Z800 Family history of malignant neoplasm of digestive organs: Secondary | ICD-10-CM

## 2021-10-30 DIAGNOSIS — R945 Abnormal results of liver function studies: Secondary | ICD-10-CM

## 2021-10-30 DIAGNOSIS — B1910 Unspecified viral hepatitis B without hepatic coma: Secondary | ICD-10-CM | POA: Diagnosis not present

## 2021-10-30 LAB — PROTIME-INR
INR: 1 ratio (ref 0.8–1.0)
Prothrombin Time: 11.5 s (ref 9.6–13.1)

## 2021-10-30 NOTE — Progress Notes (Signed)
[facility] Progress Note  Chief Complaint: Chronic hepatitis B virus infection and cirrhosis  Subjective  History: [name] follows up for his HBV and cirrhosis.  He is on stable dosing of Viread milligrams daily for years.  After I last saw him his insurance denied namebrand, so I prescribed generic tenofovir.  This gave him digestive upset so it was changed back to namebrand, which was approved at the 150 mg dose with prior authorization, so he takes 2 tablets once daily. When I saw him in November 2022 plans were for screening EGD, which was scheduled on 05/20/2021.  He called and canceled due to a family emergency with no subsequent call to reschedule.  [name] says he is feeling well with no particular complaints today.  He tells me the pharmacy changed his Viread back to 300 mg tablets, so he takes 1 daily.  He has no digestive upset or other apparent side effects from that medicine at that dose.  ROS: Cardiovascular:  no chest pain Respiratory: no dyspnea  The patient's Past Medical, Family and Social History were reviewed and are on file in the EMR.  Objective:  Med list reviewed  Current Outpatient Medications:    Cyanocobalamin (VITAMIN B12) 1000 MCG TBCR, Take 1 tablet by mouth daily., Disp: , Rfl:    Tenofovir Disoproxil Fumarate (VIREAD) 150 MG TABS, Take two '150mg'$  tablets once a day, Disp: 60 tablet, Rfl: 3   traMADol (ULTRAM) 50 MG tablet, Take 2 tablets (100 mg total) by mouth 2 (two) times daily., Disp: 120 tablet, Rfl: 5   Vital signs in last 24 hrs: Vitals:   10/30/21 1429  BP: 126/80  Pulse: 72   Wt Readings from Last 3 Encounters:  10/30/21 181 lb (82.1 kg)  10/29/21 182 lb (82.6 kg)  09/14/21 182 lb (82.6 kg)    Physical Exam  Well-appearing HEENT: sclera anicteric, oral mucosa moist without lesions Neck: supple, no thyromegaly, JVD or lymphadenopathy Cardiac: Regular, no murmur,  no peripheral edema Pulm: clear to auscultation bilaterally,  normal RR and effort noted Abdomen: soft, no tenderness, with active bowel sounds. No guarding or palpable hepatosplenomegaly. Skin; warm and dry, no jaundice or rash  Labs:     Latest Ref Rng & Units 10/29/2021   10:29 AM 12/31/2020    3:52 PM 10/07/2020   12:07 PM  CBC  WBC 4.0 - 10.5 K/uL 6.6   7.6   7.1    Hemoglobin 13.0 - 17.0 g/dL 16.8   15.6   16.7    Hematocrit 39.0 - 52.0 % 47.5   45.8   48.1    Platelets 150 - 400 K/uL 330   320.0   336        Latest Ref Rng & Units 10/29/2021   10:29 AM 12/31/2020    3:52 PM 10/07/2020   12:07 PM  CMP  Glucose 70 - 99 mg/dL 91   98   89    BUN 8 - 23 mg/dL '17   20   13    '$ Creatinine 0.61 - 1.24 mg/dL 0.89   0.93   0.81    Sodium 135 - 145 mmol/L 139   138   140    Potassium 3.5 - 5.1 mmol/L 3.8   3.6   3.7    Chloride 98 - 111 mmol/L 105   106   104    CO2 22 - 32 mmol/L 28   22   27  Calcium 8.9 - 10.3 mg/dL 9.5   9.4   9.9    Total Protein 6.5 - 8.1 g/dL 8.0   7.8   8.8    Total Bilirubin 0.3 - 1.2 mg/dL 0.4   0.5   0.6    Alkaline Phos 38 - 126 U/L 130   121   119    AST 15 - 41 U/L '23   23   25    '$ ALT 0 - 44 U/L '22   25   26      '$ ___________________________________________ Radiologic studies:   ____________________________________________ Other:   _____________________________________________ Assessment & Plan  Assessment: Encounter Diagnoses  Name Primary?   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  Family history of liver cancer, mild elevation alkaline phosphatase from cirrhosis.  Plan: Hepatitis viral load, AFP, INR today Abdominal ultrasound for hepatoma screening and assessment of ascites  EGD to screen for varices and its rationale discussed again, and he declines at this time.  RTO 6 months  Nelida Meuse III

## 2021-10-30 NOTE — Patient Instructions (Signed)
If you are age 67 or older, your body mass index should be between 23-30. Your Body mass index is 27.52 kg/m. If this is out of the aforementioned range listed, please consider follow up with your Primary Care Provider.  If you are age 26 or younger, your body mass index should be between 19-25. Your Body mass index is 27.52 kg/m. If this is out of the aformentioned range listed, please consider follow up with your Primary Care Provider.   ________________________________________________________  The [facility] providers would like to encourage you to use [facility] to communicate with providers for non-urgent requests or questions.  Due to long hold times on the telephone, sending your provider a message by [facility] may be a faster and more efficient way to get a response.  Please allow 48 business hours for a response.  Please remember that this is for non-urgent requests.  _______________________________________________________  Your provider has requested that you go to the basement level for lab work before leaving today. Press "B" on the elevator. The lab is located at the first door on the left as you exit the elevator.  You have been scheduled for an abdominal ultrasound at [facility] (1st floor of hospital) on [id number] at 10:30am. Please arrive 15 minutes prior to your appointment for registration. Make certain not to have anything to eat or drink 6 hours prior to your appointment. Should you need to reschedule your appointment, please contact radiology at [phone]. This test typically takes about 30 minutes to perform.

## 2021-11-03 ENCOUNTER — Other Ambulatory Visit (HOSPITAL_COMMUNITY): Payer: Self-pay | Admitting: Gastroenterology

## 2021-11-03 DIAGNOSIS — B181 Chronic viral hepatitis B without delta-agent: Secondary | ICD-10-CM

## 2021-11-03 DIAGNOSIS — Z800 Family history of malignant neoplasm of digestive organs: Secondary | ICD-10-CM

## 2021-11-03 DIAGNOSIS — R945 Abnormal results of liver function studies: Secondary | ICD-10-CM

## 2021-11-03 DIAGNOSIS — B1910 Unspecified viral hepatitis B without hepatic coma: Secondary | ICD-10-CM

## 2021-11-04 NOTE — Progress Notes (Signed)
HEMATOLOGY/ONCOLOGY CLINIC NOTE  Date of Service: .10/29/2021   Patient Care Team: Marty Heck, MD as PCP - General (Gastroenterology)  CHIEF COMPLAINTS/PURPOSE OF CONSULTATION:  Follow-up for Hairy cell leukemia  HISTORY OF PRESENTING ILLNESS:   [name] is a wonderful 66 y.o. male who has been referred to [place] by Dr. Jacelyn Grip for evaluation and management of hx of hairy cell leukemia. The pt reports that he is doing well overall.  The pt reports that he saw Dr. Humphrey Rolls here in 2005 and was diagnosed with hairy cell leukemia. The pt has been in remission since 2006.  The pt notes that he was able to tolerate the Cladribine inpatient chemotherapy treatment as well as possible. The pt is not sure why he was referred here and that his Hgb was elevated is potentially why. The pt notes he was referred by his PCP Dr. Jacelyn Grip at [facility]. The pt notes that he is still a current everyday smoker at around one pack each day or just under this. The pt notes that he was diagnosed with Hepatitis B in 2005. He notes that to his knowledge he was not diagnosed until after he had started treatment and needing blood transfusions. The pt denies any history of blood transfusions in the past prior to this. The pt currently sees his doctor and gets an ultrasound regarding his liver cirrhosis every six months. This has been very stable. The pt notes that he was not told why his B12 was low. The pt notes he is unable to follow Ramadan as he has to eat and drink something due to his medical exceptions. The pt notes that he does not eat much meat, denying any red meat. The pt notes he has been on B12 replacement for the last two months. According to the pt, he does not feel any different now than he did six months or one year ago. The pt notes that he currently does not work and notes no allergies. The pt notes his mother passed away from liver cancer, denying her having Hepatitis. The pt notes his brother also passed away  from liver cancer, not sure if he had Hepatitis or not. The pt notes that they were living back home in [place] and not receiving very good medical care.  The pt notes no other questions or concerns at this time. The pt denies any exposure to chemicals. The pt used to work in [employer] and notes no specific radiation exposure. The pt notes he currently sees Dr. Alessandra Bevels for his Viread management, but he missed two appointments and is no longer allowed to go there. The pt is currently looking for another a doctor to manage his Viread.  Lab results 06/03/2020 of CBC w/diff and CMP is as follows: all values are WNL except for Hgb of 17.2, MCV of 98.4, Mch of 33.4, Lymph Abs of 3.40K. 06/03/2020 Vitamin B12 of 142.  On review of systems, pt denies fevers, chills, night sweats, sudden weight loss, decreased appetite, leg swelling, and any other symptoms.  INTERVAL HISTORY:  [name] is here for evaluation of hairy cell leukemia.  He notes no acute new symptoms since his last clinic visit about 1 year ago. No fevers no chills no night sweats. No new lumps or bumps. No infection issues. No abdominal pain or distention. Labs done today were discussed in detail with the patient  MEDICAL HISTORY:  Past Medical History:  Diagnosis Date   Cervical radiculopathy  Cirrhosis ([place])    GERD (gastroesophageal reflux disease)    Hairy cell leukemia ([place]) 2005   Last chemo 2006   Hepatitis B infection    Incisional hernia    Lumbar radiculopathy    Memory impairment    Myopathy    Vitamin D deficiency     SURGICAL HISTORY: Past Surgical History:  Procedure Laterality Date   HERNIA REPAIR     laparoscopic ventral    SPLENECTOMY, TOTAL      SOCIAL HISTORY: Social History   Socioeconomic History   Marital status: Married    Spouse name: Not on file   Number of children: 3   Years of education: Not on file   Highest education level: Not on file  Occupational History    Occupation: Unemployed  Tobacco Use   Smoking status: Some Days    Packs/day: 0.50    Types: Cigarettes   Smokeless tobacco: Never  Vaping Use   Vaping Use: Never used  Substance and Sexual Activity   Alcohol use: No    Alcohol/week: 0.0 standard drinks   Drug use: No   Sexual activity: Not Currently  Other Topics Concern   Not on file  Social History Narrative   Some caffeine use    Social Determinants of Health   Financial Resource Strain: Not on file  Food Insecurity: Not on file  Transportation Needs: Not on file  Physical Activity: Not on file  Stress: Not on file  Social Connections: Not on file  Intimate Partner Violence: Not on file    FAMILY HISTORY: Family History  Problem Relation Age of Onset   Liver cancer Mother    Diabetes Father    Liver cancer Brother    Heart attack Brother 46   Heart attack Sister 78    ALLERGIES:  is allergic to metrizamide and iodinated contrast media.  MEDICATIONS:  Current Outpatient Medications  Medication Sig Dispense Refill   Cyanocobalamin (VITAMIN B12) 1000 MCG TBCR Take 1 tablet by mouth daily.     Tenofovir Disoproxil Fumarate (VIREAD) 150 MG TABS Take two '150mg'$  tablets once a day 60 tablet 3   traMADol (ULTRAM) 50 MG tablet Take 2 tablets (100 mg total) by mouth 2 (two) times daily. 120 tablet 5   No current facility-administered medications for this visit.    REVIEW OF SYSTEMS:    10 Point review of Systems was done is negative except as noted above.  PHYSICAL EXAMINATION: ECOG PERFORMANCE STATUS: 0 - Asymptomatic  . Vitals:   10/29/21 1109  BP: 135/76  Pulse: 74  Resp: 20  Temp: (!) 97.5 F (36.4 C)  SpO2: 97%   Filed Weights   10/29/21 1109  Weight: 182 lb (82.6 kg)   .Body mass index is 27.67 kg/m.  NAD GENERAL:alert, in no acute distress and comfortable SKIN: no acute rashes, no significant lesions EYES: conjunctiva are pink and non-injected, sclera anicteric OROPHARYNX: MMM, no  exudates, no oropharyngeal erythema or ulceration NECK: supple, no JVD LYMPH:  no palpable lymphadenopathy in the cervical, axillary or inguinal regions LUNGS: clear to auscultation b/l with normal respiratory effort HEART: regular rate & rhythm ABDOMEN:  normoactive bowel sounds , non tender, not distended. Extremity: no pedal edema PSYCH: alert & oriented x 3 with fluent speech NEURO: no focal motor/sensory deficits   LABORATORY DATA:  I have reviewed the data as listed  .    Latest Ref Rng & Units 10/29/2021   10:29 AM 12/31/2020  3:52 PM 10/07/2020   12:07 PM  CBC  WBC 4.0 - 10.5 K/uL 6.6   7.6   7.1    Hemoglobin 13.0 - 17.0 g/dL 16.8   15.6   16.7    Hematocrit 39.0 - 52.0 % 47.5   45.8   48.1    Platelets 150 - 400 K/uL 330   320.0   336     . CBC    Component Value Date/Time   WBC 6.6 10/29/2021 1029   WBC 7.6 12/31/2020 1552   RBC 4.90 10/29/2021 1029   HGB 16.8 10/29/2021 1029   HGB 16.1 04/09/2009 1024   HGB 14.8 09/16/2005 0822   HCT 47.5 10/29/2021 1029   HCT 47.9 04/09/2009 1024   HCT 43.4 09/16/2005 0822   PLT 330 10/29/2021 1029   PLT 265 04/09/2009 1024   PLT 330 09/16/2005 0822   MCV 96.9 10/29/2021 1029   MCV 99 (H) 04/09/2009 1024   MCV 94.8 09/16/2005 0822   MCH 34.3 (H) 10/29/2021 1029   MCHC 35.4 10/29/2021 1029   RDW 14.9 10/29/2021 1029   RDW 11.7 04/09/2009 1024   RDW 15.7 (H) 09/16/2005 0822   LYMPHSABS 2.7 10/29/2021 1029   LYMPHSABS 2.6 04/09/2009 1024   LYMPHSABS 1.9 09/16/2005 0822   MONOABS 0.5 10/29/2021 1029   MONOABS 0.9 09/16/2005 0822   EOSABS 0.1 10/29/2021 1029   EOSABS 0.2 04/09/2009 1024   BASOSABS 0.1 10/29/2021 1029   BASOSABS 0.1 04/09/2009 1024   BASOSABS 0.0 09/16/2005 0822    .    Latest Ref Rng & Units 10/29/2021   10:29 AM 12/31/2020    3:52 PM 10/07/2020   12:07 PM  CMP  Glucose 70 - 99 mg/dL 91   98   89    BUN 8 - 23 mg/dL '17   20   13    '$ Creatinine 0.61 - 1.24 mg/dL 0.89   0.93   0.81    Sodium 135  - 145 mmol/L 139   138   140    Potassium 3.5 - 5.1 mmol/L 3.8   3.6   3.7    Chloride 98 - 111 mmol/L 105   106   104    CO2 22 - 32 mmol/L '28   22   27    '$ Calcium 8.9 - 10.3 mg/dL 9.5   9.4   9.9    Total Protein 6.5 - 8.1 g/dL 8.0   7.8   8.8    Total Bilirubin 0.3 - 1.2 mg/dL 0.4   0.5   0.6    Alkaline Phos 38 - 126 U/L 130   121   119    AST 15 - 41 U/L '23   23   25    '$ ALT 0 - 44 U/L '22   25   26     '$ . Lab Results  Component Value Date   LDH 144 10/29/2021    10/07/2020 JAK2   10/07/2020 Flow Cytometry - No monoclonal B-cell population or abnormal T-cell phenotype  identified.    RADIOGRAPHIC STUDIES: I have personally reviewed the radiological images as listed and agreed with the findings in the report. No results found.  ASSESSMENT & PLAN:   65 yo with   1) H/o Hairy cell leukemia 2) B12 deficiency 3) h/o Hepatitis B  PLAN: -Discussed patient's lab work from today in detail CBC is within normal limits with normal WBC counts of 6.6k hemoglobin of 16.8 and platelets  of 330k with normal lymphocytes of 2.7k LDH within normal limits at 144 CMP within normal limits. Patient has no clinical or lab findings suggestive of a T-cell leukemia recurrence/progression at this time.  -Continue current vitamin B12 replacement with PCP.  FOLLOW UP: RTC with Dr Irene Limbo with labs in 12 months  .The total time spent in the appointment was 20 minutes* .  All of the patient's questions were answered with apparent satisfaction. The patient knows to call the clinic with any problems, questions or concerns.   Sullivan Lone MD MS AAHIVMS [facility] Hematology/Oncology Physician [facility]  .*Total Encounter Time as defined by the Centers for Medicare and Medicaid Services includes, in addition to the face-to-face time of a patient visit (documented in the note above) non-face-to-face time: obtaining and reviewing outside history, ordering and reviewing medications, tests  or procedures, care coordination (communications with other health care professionals or caregivers) and documentation in the medical record.

## 2021-11-05 LAB — HEPATITIS B DNA, ULTRAQUANTITATIVE, PCR
Hepatitis B DNA (Calc): <1 Log IU/mL
Hepatitis B DNA: <10 IU/mL

## 2021-11-05 LAB — AFP TUMOR MARKER: AFP-Tumor Marker: 2.5 ng/mL (ref <6.1)

## 2021-11-06 ENCOUNTER — Ambulatory Visit (HOSPITAL_COMMUNITY)
Admission: RE | Admit: 2021-11-06 | Discharge: 2021-11-06 | Disposition: A | Discharge status: Home / Self Care | Payer: Medicare HMO | Source: Ambulatory Visit | Attending: Gastroenterology | Admitting: Gastroenterology

## 2021-11-06 DIAGNOSIS — R945 Abnormal results of liver function studies: Secondary | ICD-10-CM

## 2021-11-06 DIAGNOSIS — B181 Chronic viral hepatitis B without delta-agent: Secondary | ICD-10-CM

## 2021-11-06 DIAGNOSIS — K7460 Unspecified cirrhosis of liver: Secondary | ICD-10-CM | POA: Diagnosis not present

## 2021-11-06 DIAGNOSIS — Z800 Family history of malignant neoplasm of digestive organs: Secondary | ICD-10-CM | POA: Diagnosis not present

## 2021-11-06 DIAGNOSIS — B1910 Unspecified viral hepatitis B without hepatic coma: Secondary | ICD-10-CM | POA: Diagnosis not present

## 2021-11-11 ENCOUNTER — Other Ambulatory Visit: Payer: Self-pay

## 2021-11-11 MED ORDER — VIREAD 150 MG PO TABS: ORAL_TABLET | ORAL | 3 refills | Status: DC

## 2021-12-15 DIAGNOSIS — N281 Cyst of kidney, acquired: Secondary | ICD-10-CM | POA: Diagnosis not present

## 2021-12-15 DIAGNOSIS — Z125 Encounter for screening for malignant neoplasm of prostate: Secondary | ICD-10-CM | POA: Diagnosis not present

## 2021-12-17 ENCOUNTER — Telehealth: Payer: Self-pay | Admitting: Gastroenterology

## 2021-12-30 ENCOUNTER — Telehealth: Payer: Self-pay | Admitting: Gastroenterology

## 2021-12-31 NOTE — Telephone Encounter (Signed)
Called and spoke to the pharmacy. Rx was changed back to the Viread '300mg'$  tablets from the '150mg'$  tabs due to insurance coverage.

## 2022-01-04 ENCOUNTER — Other Ambulatory Visit (HOSPITAL_COMMUNITY): Payer: Self-pay

## 2022-01-12 ENCOUNTER — Telehealth: Payer: Self-pay

## 2022-01-12 ENCOUNTER — Telehealth: Payer: Self-pay | Admitting: Pharmacy Technician

## 2022-01-12 ENCOUNTER — Other Ambulatory Visit (HOSPITAL_COMMUNITY): Payer: Self-pay

## 2022-01-12 NOTE — Telephone Encounter (Signed)
Spoke to the pharmacy. Medication is needing new PA for Viread '300mg'$ . PA has been sent to the PA team

## 2022-01-12 NOTE — Telephone Encounter (Signed)
[place] called regarding [place]. Please call to advise.

## 2022-01-12 NOTE — Telephone Encounter (Signed)
In process of submitting PA for Viread '300mg'$  1poqd. Will have to call as CoverMyMeds is not accepting BRAND dosing of '300mg'$ . Telephone Encounter to follow to show updates.

## 2022-01-12 NOTE — Telephone Encounter (Signed)
Patient called regarding Viread '300mg'$  medication. Please call to advise.

## 2022-01-12 NOTE — Telephone Encounter (Signed)
PA needed for Viread '300mg'$  tablets, 1 a day.

## 2022-01-12 NOTE — Telephone Encounter (Signed)
Patient Advocate Encounter  Received notification from [place] that prior authorization for [facility] '300MG'$  is required.   PA submitted on 8.8.23  VIA PHONE [phone] Reference #[id number] Status is pending. Please allow 24-72hrs   Luciano Cutter, CPhT Patient Advocate Phone: [phone]

## 2022-01-13 ENCOUNTER — Telehealth: Payer: Self-pay | Admitting: Hematology

## 2022-01-13 ENCOUNTER — Other Ambulatory Visit (HOSPITAL_COMMUNITY): Payer: Self-pay

## 2022-01-13 NOTE — Telephone Encounter (Signed)
Patient Advocate Encounter  Prior Authorization for Veva Holes '300MG'$  has been approved.    PA# [id number] Effective dates: 8.8.23 through 12.31.23  Shadasia Oldfield B. CPhT P: [phone] F: [phone]

## 2022-01-13 NOTE — Telephone Encounter (Signed)
Patient has been notified and aware.  

## 2022-01-13 NOTE — Telephone Encounter (Signed)
Called patient regarding 2024 appointment, patient is notified. 

## 2022-01-13 NOTE — Telephone Encounter (Signed)
PA has been approved and patient is aware.

## 2022-01-15 ENCOUNTER — Other Ambulatory Visit: Payer: Self-pay | Admitting: Registered Nurse

## 2022-01-18 NOTE — Telephone Encounter (Signed)
PMP was Reviewed Tramadol e-scribed today. [name] has a scheduled appointment in October.

## 2022-03-11 ENCOUNTER — Encounter: Payer: Medicare HMO | Attending: Registered Nurse | Admitting: Registered Nurse

## 2022-03-11 ENCOUNTER — Encounter: Payer: Self-pay | Admitting: Registered Nurse

## 2022-03-11 VITALS — BP 140/86 | HR 62 | Ht 68.0 in | Wt 175.6 lb

## 2022-03-11 DIAGNOSIS — G894 Chronic pain syndrome: Secondary | ICD-10-CM | POA: Diagnosis not present

## 2022-03-11 DIAGNOSIS — Z5181 Encounter for therapeutic drug level monitoring: Secondary | ICD-10-CM | POA: Diagnosis not present

## 2022-03-11 DIAGNOSIS — Z79891 Long term (current) use of opiate analgesic: Secondary | ICD-10-CM | POA: Insufficient documentation

## 2022-03-11 DIAGNOSIS — M5416 Radiculopathy, lumbar region: Secondary | ICD-10-CM | POA: Diagnosis not present

## 2022-03-11 MED ORDER — TRAMADOL HCL 50 MG PO TABS: ORAL_TABLET | ORAL | 5 refills | Status: DC

## 2022-03-11 NOTE — Progress Notes (Signed)
Subjective:    Patient ID: [name], male    DOB: 1955/12/15, 66 y.o.   MRN: [id number]  HPI: [name] is a 66 y.o. male who returns for follow up appointment for chronic pain and medication refill. He states his pain is located in his lower back radiating into his right lower extremity.He  rates his pain 5. His current exercise regime is walking and performing stretching exercises.  [name] Morphine equivalent is 40.00 MME.   UDS ordered today.     Pain Inventory Average Pain 5 Pain Right Now 5 My pain is sharp  In the last 24 hours, has pain interfered with the following? General activity 7 Relation with others 3 Enjoyment of life 6 What TIME of day is your pain at its worst? daytime Sleep (in general) Fair  Pain is worse with: walking, bending, sitting, standing, and some activites Pain improves with: medication Relief from Meds: 5  Family History  Problem Relation Age of Onset   Liver cancer Mother    Diabetes Father    Liver cancer Brother    Heart attack Brother 66   Heart attack Sister 81   Social History   Socioeconomic History   Marital status: Married    Spouse name: Not on file   Number of children: 3   Years of education: Not on file   Highest education level: Not on file  Occupational History   Occupation: Unemployed  Tobacco Use   Smoking status: Some Days    Packs/day: 0.50    Types: Cigarettes   Smokeless tobacco: Never  Vaping Use   Vaping Use: Never used  Substance and Sexual Activity   Alcohol use: No    Alcohol/week: 0.0 standard drinks of alcohol   Drug use: No   Sexual activity: Not Currently  Other Topics Concern   Not on file  Social History Narrative   Some caffeine use    Social Determinants of Health   Financial Resource Strain: Not on file  Food Insecurity: Not on file  Transportation Needs: Not on file  Physical Activity: Not on file  Stress: Not on file  Social Connections: Not on file    Past Surgical History:  Procedure Laterality Date   HERNIA REPAIR     laparoscopic ventral    SPLENECTOMY, TOTAL     Past Surgical History:  Procedure Laterality Date   HERNIA REPAIR     laparoscopic ventral    SPLENECTOMY, TOTAL     Past Medical History:  Diagnosis Date   Cervical radiculopathy    Cirrhosis ([place])    GERD (gastroesophageal reflux disease)    Hairy cell leukemia ([place]) 2005   Last chemo 2006   Hepatitis B infection    Incisional hernia    Lumbar radiculopathy    Memory impairment    Myopathy    Vitamin D deficiency    BP (!) 140/86   Pulse 62   Ht '5\' 8"'$  (1.727 m)   Wt 175 lb 9.6 oz (79.7 kg)   SpO2 98%   BMI 26.70 kg/m   Opioid Risk Score:   Fall Risk Score:  `1  Depression screen [facility] 2/9     03/24/2021   10:17 AM 04/08/2016   10:17 AM 10/06/2015    2:10 PM  Depression screen PHQ 2/9  Decreased Interest 0 3 3  Down, Depressed, Hopeless 0 0 0  PHQ - 2 Score 0 3 3  Altered sleeping   0  Tired, decreased energy   1  Change in appetite   1  Feeling bad or failure about yourself    1  Trouble concentrating   1  Moving slowly or fidgety/restless   0  Suicidal thoughts   0  PHQ-9 Score   7     Review of Systems  Musculoskeletal:        Whole right sided pain  All other systems reviewed and are negative.     Objective:   Physical Exam Vitals and nursing note reviewed.  Constitutional:      Appearance: Normal appearance.  Cardiovascular:     Rate and Rhythm: Normal rate and regular rhythm.     Pulses: Normal pulses.     Heart sounds: Normal heart sounds.  Pulmonary:     Effort: Pulmonary effort is normal.     Breath sounds: Normal breath sounds.  Musculoskeletal:     Cervical back: Normal range of motion and neck supple.     Comments: Normal Muscle Bulk and Muscle Testing Reveals:  Upper Extremities: Full ROM and Muscle Strength 5/5 Lumbar Paraspinal Tenderness: L-3-L-5 Lower Extremities: Full ROM and Muscle Strength 5/5 Arises  from Chair with ease Narrow Based  Gait     Skin:    General: Skin is warm and dry.  Neurological:     Mental Status: He is alert and oriented to person, place, and time.  Psychiatric:        Mood and Affect: Mood normal.        Behavior: Behavior normal.         Assessment & Plan:  1. Lumbar Degenerative Disc with Lumbar Radiculitis: Continue HEP as Tolerated. 03/11/2022 Refilled:Tramadol 50 mg take two tablets twice a day as needed for pain #120.  We will continue the opioid monitoring program, this consists of regular clinic visits, examinations, urine drug screen, pill counts as well as use of [place] Controlled Substance Reporting system. A 12 month History has been reviewed on the [place] on 10/105/2023. 2. Chronic Right Shoulder Pain: No complaints today. Continue HEP as Tolerated. Continue to Monitor. 09/14/2021   Return in 6 months

## 2022-03-13 LAB — TOXASSURE SELECT,+ANTIDEPR,UR

## 2022-03-17 ENCOUNTER — Telehealth: Payer: Self-pay | Admitting: *Deleted

## 2022-03-17 NOTE — Telephone Encounter (Signed)
Urine drug screen for this encounter is consistent for prescribed medication 

## 2022-03-25 DIAGNOSIS — H9201 Otalgia, right ear: Secondary | ICD-10-CM | POA: Diagnosis not present

## 2022-04-09 ENCOUNTER — Other Ambulatory Visit: Payer: Self-pay

## 2022-04-09 MED ORDER — VIREAD 150 MG PO TABS: ORAL_TABLET | ORAL | 3 refills | Status: DC

## 2022-04-13 ENCOUNTER — Other Ambulatory Visit (HOSPITAL_COMMUNITY): Payer: Self-pay

## 2022-04-13 ENCOUNTER — Telehealth: Payer: Self-pay

## 2022-04-13 NOTE — Telephone Encounter (Signed)
Patient Teacher, English as a foreign language completed.    PA not required

## 2022-04-14 MED ORDER — TENOFOVIR DISOPROXIL FUMARATE 300 MG PO TABS: 300.0000 mg | ORAL_TABLET | Freq: Every day | ORAL | 6 refills | Status: DC

## 2022-04-14 NOTE — Telephone Encounter (Signed)
Thank you for your help.The insurance keeps changing what they will cover. I sent a new rx for the '300mg'$  tablets, so lets see what happens.

## 2022-05-13 ENCOUNTER — Other Ambulatory Visit (INDEPENDENT_AMBULATORY_CARE_PROVIDER_SITE_OTHER): Payer: Medicare HMO

## 2022-05-13 ENCOUNTER — Ambulatory Visit (INDEPENDENT_AMBULATORY_CARE_PROVIDER_SITE_OTHER): Payer: Medicare HMO | Admitting: Gastroenterology

## 2022-05-13 ENCOUNTER — Encounter: Payer: Self-pay | Admitting: Gastroenterology

## 2022-05-13 VITALS — BP 126/70 | HR 82 | Ht 69.0 in | Wt 174.0 lb

## 2022-05-13 DIAGNOSIS — Z1211 Encounter for screening for malignant neoplasm of colon: Secondary | ICD-10-CM

## 2022-05-13 DIAGNOSIS — Z800 Family history of malignant neoplasm of digestive organs: Secondary | ICD-10-CM

## 2022-05-13 DIAGNOSIS — K7460 Unspecified cirrhosis of liver: Secondary | ICD-10-CM

## 2022-05-13 DIAGNOSIS — B181 Chronic viral hepatitis B without delta-agent: Secondary | ICD-10-CM | POA: Diagnosis not present

## 2022-05-13 DIAGNOSIS — B1910 Unspecified viral hepatitis B without hepatic coma: Secondary | ICD-10-CM

## 2022-05-13 LAB — PROTIME-INR
INR: 1.1 ratio — ABNORMAL HIGH (ref 0.8–1.0)
Prothrombin Time: 12.5 s (ref 9.6–13.1)

## 2022-05-13 LAB — COMPREHENSIVE METABOLIC PANEL
ALT: 15 U/L (ref 0–53)
AST: 18 U/L (ref 0–37)
Albumin: 4.5 g/dL (ref 3.5–5.2)
Alkaline Phosphatase: 104 U/L (ref 39–117)
BUN: 21 mg/dL (ref 6–23)
CO2: 27 mEq/L (ref 19–32)
Calcium: 9.3 mg/dL (ref 8.4–10.5)
Chloride: 105 mEq/L (ref 96–112)
Creatinine, Ser: 0.82 mg/dL (ref 0.40–1.50)
GFR: 91.11 mL/min (ref >60.00)
Glucose, Bld: 89 mg/dL (ref 70–99)
Potassium: 4 mEq/L (ref 3.5–5.1)
Sodium: 139 mEq/L (ref 135–145)
Total Bilirubin: 0.4 mg/dL (ref 0.2–1.2)
Total Protein: 7.7 g/dL (ref 6.0–8.3)

## 2022-05-13 LAB — CBC WITH DIFFERENTIAL/PLATELET
Basophils Absolute: 0.1 10*3/uL (ref 0.0–0.1)
Basophils Relative: 0.8 % (ref 0.0–3.0)
Eosinophils Absolute: 0.1 10*3/uL (ref 0.0–0.7)
Eosinophils Relative: 1.5 % (ref 0.0–5.0)
HCT: 48 % (ref 39.0–52.0)
Hemoglobin: 16.6 g/dL (ref 13.0–17.0)
Lymphocytes Relative: 43.7 % (ref 12.0–46.0)
Lymphs Abs: 3.3 10*3/uL (ref 0.7–4.0)
MCHC: 34.5 g/dL (ref 30.0–36.0)
MCV: 98.5 fl (ref 78.0–100.0)
Monocytes Absolute: 0.7 10*3/uL (ref 0.1–1.0)
Monocytes Relative: 9.3 % (ref 3.0–12.0)
Neutro Abs: 3.4 10*3/uL (ref 1.4–7.7)
Neutrophils Relative %: 44.7 % (ref 43.0–77.0)
Platelets: 316 10*3/uL (ref 150.0–400.0)
RBC: 4.87 Mil/uL (ref 4.22–5.81)
RDW: 13.5 % (ref 11.5–15.5)
WBC: 7.6 10*3/uL (ref 4.0–10.5)

## 2022-05-13 NOTE — Progress Notes (Addendum)
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  When I last saw him on 10/29/2021, labs and imaging for [facility] screening and updated MELD score ordered.  He declined an upper endoscopy for variceal screening at that time (a test which he had also canceled 2022).  [name] follows up for his HBV and cirrhosis.  He has never physical complaints at this point.  Denies abdominal pain, nausea or vomiting, change in bowel habits or rectal bleeding.  Takes his antiviral therapy daily, frustrated that his insurance sometimes changes preference for name brand versus generic  Negative Cologuard June 2020 while under the care of [facility].  Patient previously offered a screening colonoscopy upon establish care at our practice in 2022.  ROS:  Review of Systems  Constitutional:  Negative for appetite change and unexpected weight change.  HENT:  Negative for mouth sores and voice change.   Eyes:  Negative for pain and redness.  Respiratory:  Negative for cough and shortness of breath.   Cardiovascular:  Negative for chest pain and palpitations.  Genitourinary:  Negative for dysuria and hematuria.  Musculoskeletal:  Positive for arthralgias. Negative for myalgias.  Skin:  Negative for pallor and rash.  Neurological:  Negative for weakness and headaches.  Hematological:  Negative for adenopathy.     Past Medical History: Past Medical History:  Diagnosis Date   Cervical radiculopathy    Cirrhosis (HCC)    GERD (gastroesophageal reflux disease)    Hairy cell leukemia (HCC) 2005   Last chemo 2006   Hepatitis B infection    Incisional hernia    Lumbar  radiculopathy    Memory impairment    Myopathy    Vitamin D deficiency      Past Surgical History: Past Surgical History:  Procedure Laterality Date   HERNIA REPAIR     laparoscopic ventral    SPLENECTOMY, TOTAL       Family History: Family History  Problem Relation Age of Onset   Liver cancer Mother    Diabetes Father    Liver cancer Brother    Heart attack Brother 54   Heart attack Sister 60    Social History: Social History   Socioeconomic History   Marital status: Married    Spouse name: Not on file   Number of children: 3   Years of education: Not on file   Highest education level: Not on file  Occupational History   Occupation: Unemployed  Tobacco Use   Smoking status: Some Days    Packs/day: 0.50    Types: Cigarettes   Smokeless tobacco: Never  Vaping Use   Vaping Use: Never used  Substance and Sexual Activity   Alcohol use: No    Alcohol/week: 0.0 standard drinks of alcohol   Drug use: No   Sexual activity: Not Currently  Other Topics Concern   Not on file  Social History Narrative   Some caffeine use    Social Determinants of Health   Financial Resource Strain: Not on file  Food Insecurity: Not on file  Transportation Needs: Not on file  Physical Activity: Not on file  Stress: Not on file  Social Connections: Not on file  Allergies: Allergies  Allergen Reactions   Metrizamide Swelling    Itching and swelling with pre meds in 2005; non ionic cm   Iodinated Contrast Media Swelling and Other (See Comments)    Itching and swelling with pre meds in 2005; non ionic cm    Outpatient Meds: Current Outpatient Medications  Medication Sig Dispense Refill   Cyanocobalamin (VITAMIN B12) 1000 MCG TBCR Take 1 tablet by mouth daily.     tenofovir (VIREAD) 300 MG tablet Take 1 tablet (300 mg total) by mouth daily. 30 tablet 6   traMADol (ULTRAM) 50 MG tablet TAKE 2 TABLETS(100 MG) BY MOUTH TWICE DAILY 120 tablet 5   No current  facility-administered medications for this visit.      ___________________________________________________________________ Objective   Exam:  BP 126/70   Pulse 82   Ht 5\' 9"  (1.753 m)   Wt 174 lb (78.9 kg)   BMI 25.70 kg/m  Wt Readings from Last 3 Encounters:  05/13/22 174 lb (78.9 kg)  03/11/22 175 lb 9.6 oz (79.7 kg)  10/30/21 181 lb (82.1 kg)    General: Well-appearing Eyes: sclera anicteric, no redness ENT: oral mucosa moist without lesions, no cervical or supraclavicular lymphadenopathy CV: Regular without murmur, no JVD, no peripheral edema Resp: clear to auscultation bilaterally, normal RR and effort noted GI: soft, no tenderness, with active bowel sounds. No guarding or palpable organomegaly noted. Skin; warm and dry, no rash or jaundice noted.  No spider nevi Neuro: awake, alert and oriented x 3. Normal gross motor function and fluent speech  Labs:  HBV viral load undetected 10/29/2021     Latest Ref Rng & Units 10/29/2021   10:29 AM 12/31/2020    3:52 PM 10/07/2020   12:07 PM  CBC  WBC 4.0 - 10.5 K/uL 6.6  7.6  7.1   Hemoglobin 13.0 - 17.0 g/dL 16.1  09.6  04.5   Hematocrit 39.0 - 52.0 % 47.5  45.8  48.1   Platelets 150 - 400 K/uL 330  320.0  336       Latest Ref Rng & Units 10/29/2021   10:29 AM 12/31/2020    3:52 PM 10/07/2020   12:07 PM  CMP  Glucose 70 - 99 mg/dL 91  98  89   BUN 8 - 23 mg/dL 17  20  13    Creatinine 0.61 - 1.24 mg/dL 4.09  8.11  9.14   Sodium 135 - 145 mmol/L 139  138  140   Potassium 3.5 - 5.1 mmol/L 3.8  3.6  3.7   Chloride 98 - 111 mmol/L 105  106  104   CO2 22 - 32 mmol/L 28  22  27    Calcium 8.9 - 10.3 mg/dL 9.5  9.4  9.9   Total Protein 6.5 - 8.1 g/dL 8.0  7.8  8.8   Total Bilirubin 0.3 - 1.2 mg/dL 0.4  0.5  0.6   Alkaline Phos 38 - 126 U/L 130  121  119   AST 15 - 41 U/L 23  23  25    ALT 0 - 44 U/L 22  25  26     AFP 2.5 on 10/29/2021  Radiologic Studies:  CLINICAL DATA:  Abnormal LFTs. Cirrhosis due to chronic  hepatitis-B. Family history of liver cancer. Evaluate hepatic vasculature.   EXAM: DUPLEX ULTRASOUND OF LIVER   TECHNIQUE: Color and duplex Doppler ultrasound was performed to evaluate the hepatic in-flow and out-flow vessels.   COMPARISON:  Abdominal ultrasound-earlier same day; 03/17/2021;  right upper quadrant abdominal ultrasound-09/16/2020   FINDINGS: Portal Vein Velocities (normal hepatopetal directional flow)   Main:  22 cm/sec   Right:  17 cm/sec   Left:  20 cm/sec   Hepatic Vein Velocities (normal hepatofugal directional flow).   Right:  20 cm/sec   Middle:  25 cm/sec   Left:  20 cm/sec   Hepatic Artery Velocity:  50 cm/sec   Splenic Vein Velocity:  13 cm/sec   Varices: None visualized   Ascites: None visualized   The spleen is surgically absent.   IMPRESSION: Patent hepatic vasculature with normal velocities and directional flow.     Electronically Signed   By: Simonne Come M.D.   On: 11/07/2021 10:01  ________________________  CLINICAL DATA:  Abnormal LFTs. Cirrhosis of the liver due to hepatitis B. Family history of hepatocellular carcinoma.   EXAM: ABDOMEN ULTRASOUND COMPLETE   COMPARISON:  None Available.   FINDINGS: Gallbladder: Normal sonographic appearance of the gallbladder. No gallbladder wall thickening or pericholecystic stranding. No echogenic gallstones or biliary sludge. Negative sonographic Murphy sign.   Common bile duct: Diameter: Normal in size measuring 3.8 mm in diameter.   Liver: Mild nodularity of the hepatic contour is suspected (representative images 17 and through 19). No discrete hepatic lesions are identified though the liver is suboptimally visualized secondary to poor sonographic window. Portal vein is patent on color Doppler imaging with normal direction of blood flow towards the liver.   IVC: No abnormality visualized.   Pancreas: Visualized portion unremarkable.   Spleen: Surgically absent.   Right  Kidney: Normal cortical thickness, echogenicity and size, measuring 12.0 cm in length. Note is made of an approximately 6.1 x 5.6 x 5.3 cm partially exophytic anechoic cyst arising from the mid aspect the right kidney (image 73) as well as an approximately 4.8 x 4.5 x 4.2 cm exophytic cyst arising from the superior pole the right kidney (image 76). No echogenic renal stones. No urinary obstruction.   Left Kidney: Normal cortical thickness, echogenicity and size, measuring 11.4 cm in length. Note is made of an approximately 1.4 x 1.4 x 1.4 cm anechoic partially exophytic cyst arising from the inferior pole of the left kidney (image 93), an approximately 1.9 x 1.6 x 1.3 cm exophytic cyst arising from the inferior pole of the left kidney (image 96), an approximately 2.2 x 1.9 x 1.8 cm exophytic cyst arising from the superior pole of the left kidney (image 99) as well as an approximately 2.4 x 2.0 x 2.0 cm hypoechoic cyst involving the superior pole of the left kidney (image 102). No echogenic renal stones. No urinary obstruction.   Abdominal aorta: No aneurysm visualized.   Other findings: None.   IMPRESSION: 1. Mild nodularity of the hepatic contour compatible with provided history of cirrhosis. 2. No discrete hepatic lesions though further evaluation with abdominal MRI could be performed as indicated. 3. Incidentally noted bilateral renal cysts.     Electronically Signed   By: Simonne Come M.D.   On: 11/07/2021 13:43     Assessment: Encounter Diagnoses  Name Primary?   Cirrhosis of liver due to hepatitis B (HCC) Yes   Chronic hepatitis B virus infection (HCC)    Family history of liver cancer    Special screening for malignant neoplasms, colon     Chronic stable hepatitis B virus infection for years on antiviral therapy.  Compliant with treatment. Chronic stable compensated cirrhosis from hepatitis B virus infection.  He remains uninterested in upper endoscopy  to screen  for esophageal varices.  Hepatitis B and family history of liver cancer requires HCC screening.  Limited sonographic windows on prior ultrasounds, so we will do it MRI liver with and without contrast for screening this time.  (Has allergy to IV CT scan dye)  He was not agreeable to a screening colonoscopy at this time, but was agreeable to a Cologuard.  He was agreeable to a colonoscopy if the Cologuard test is positive.  Plan: In addition to the above, following labs today: CBC, CMP, INR, AFP, hepatitis B quantitative viral load   Thank you for the courtesy of this consult.  Please call me with any questions or concerns.  Charlie Pitter III  CC: Referring provider noted above

## 2022-05-13 NOTE — Patient Instructions (Addendum)
_______________________________________________________  If you are age 67 or older, your body mass index should be between 23-30. Your Body mass index is 25.7 kg/m. If this is out of the aforementioned range listed, please consider follow up with your Primary Care Provider.  If you are age 54 or younger, your body mass index should be between 19-25. Your Body mass index is 25.7 kg/m. If this is out of the aformentioned range listed, please consider follow up with your Primary Care Provider.   ________________________________________________________  The [facility] providers would like to encourage you to use [facility] to communicate with providers for non-urgent requests or questions.  Due to long hold times on the telephone, sending your provider a message by [facility] may be a faster and more efficient way to get a response.  Please allow 48 business hours for a response.  Please remember that this is for non-urgent requests.  _______________________________________________________  [name] have been scheduled for an MRI at [facility] on 05-19-2022. Your appointment time is 7pm. Please arrive to admitting (at main entrance of the hospital) 30 minutes prior to your appointment time for registration purposes. Please make certain not to have anything to eat or drink 4 hours prior to your test. In addition, if you have any metal in your body, have a pacemaker or defibrillator, please be sure to let your ordering physician know. This test typically takes 45 minutes to 1 hour to complete. Should you need to reschedule, please call [phone] to do so.   Your provider has requested that you go to the basement level for lab work before leaving today. Press "B" on the elevator. The lab is located at the first door on the left as you exit the elevator.  Due to recent changes in healthcare laws, you may see the results of your imaging and laboratory studies on MyChart before your provider has had a  chance to review them.  We understand that in some cases there may be results that are confusing or concerning to you. Not all laboratory results come back in the same time frame and the provider may be waiting for multiple results in order to interpret others.  Please give [place] 48 hours in order for your provider to thoroughly review all the results before contacting the office for clarification of your results.   Your provider has ordered Cologuard testing as an option for colon cancer screening. This is performed by [employer] and may be out of network with your insurance. PRIOR to completing the test, it is YOUR responsibility to contact your insurance about covered benefits for this test. Your out of pocket expense could be anywhere from $0.00 to $649.00.   When you call to check coverage with your insurer, please provide the following information:   -The ONLY provider of Cologuard is [place] code for Cologuard is [phone].  [employer] NPI # [id number]  -[employer] Tax ID # [id number]   We have already sent your demographic and insurance information to [employer] (phone number [phone]) and they should contact you within the next week regarding your test. If you have not heard from them within the next week, please call our office at [phone].    It was a pleasure to see you today!  Thank you for trusting me with your gastrointestinal care!

## 2022-05-16 LAB — HEPATITIS B DNA, ULTRAQUANTITATIVE, PCR
Hepatitis B DNA: NOT DETECTED IU/mL
Hepatitis B virus DNA: NOT DETECTED Log IU/mL

## 2022-05-16 LAB — AFP TUMOR MARKER: AFP-Tumor Marker: 2.5 ng/mL (ref <6.1)

## 2022-05-19 ENCOUNTER — Ambulatory Visit (HOSPITAL_COMMUNITY): Admission: RE | Admit: 2022-05-19 | Payer: Medicare HMO | Source: Ambulatory Visit

## 2022-05-26 DIAGNOSIS — Z1211 Encounter for screening for malignant neoplasm of colon: Secondary | ICD-10-CM | POA: Diagnosis not present

## 2022-06-03 LAB — COLOGUARD: COLOGUARD: POSITIVE — AB

## 2022-08-09 ENCOUNTER — Encounter: Payer: Self-pay | Admitting: Gastroenterology

## 2022-08-16 ENCOUNTER — Telehealth: Payer: Self-pay | Admitting: Gastroenterology

## 2022-08-16 NOTE — Telephone Encounter (Signed)
Patient is not ready at this time to proceed with a colonoscopy and will call when he is ready.

## 2022-08-16 NOTE — Telephone Encounter (Signed)
This patient had a positive Cologuard test January 2024.  As reflected in that result note, the patient declined a colonoscopy at that point.  I put him in for a 5-monthrecall to check back with him and I just reviewed his chart.  Please contact this patient and ask if he is ready to schedule his colonoscopy.  If he is, please make arrangements with [facility] for previsit.  If not, please inform him that we will remove him from the recall list and will not contact him further about it, but that he can still contact [place] he changes his mind and wishes to proceed.  -HD

## 2022-09-07 ENCOUNTER — Encounter: Payer: Self-pay | Admitting: Registered Nurse

## 2022-09-07 ENCOUNTER — Encounter: Payer: Medicare HMO | Attending: Registered Nurse | Admitting: Registered Nurse

## 2022-09-07 VITALS — BP 127/83 | HR 63 | Ht 69.0 in | Wt 182.0 lb

## 2022-09-07 DIAGNOSIS — M48061 Spinal stenosis, lumbar region without neurogenic claudication: Secondary | ICD-10-CM | POA: Diagnosis not present

## 2022-09-07 DIAGNOSIS — M5416 Radiculopathy, lumbar region: Secondary | ICD-10-CM | POA: Diagnosis not present

## 2022-09-07 DIAGNOSIS — G894 Chronic pain syndrome: Secondary | ICD-10-CM | POA: Insufficient documentation

## 2022-09-07 DIAGNOSIS — Z79891 Long term (current) use of opiate analgesic: Secondary | ICD-10-CM | POA: Diagnosis not present

## 2022-09-07 DIAGNOSIS — Z5181 Encounter for therapeutic drug level monitoring: Secondary | ICD-10-CM | POA: Insufficient documentation

## 2022-09-07 MED ORDER — TRAMADOL HCL 50 MG PO TABS: ORAL_TABLET | ORAL | 5 refills | Status: DC

## 2022-09-07 NOTE — Progress Notes (Signed)
Subjective:    Patient ID: [name], male    DOB: 1955/09/25, 67 y.o.   MRN: [id number]  HPI: [name] is a 67 y.o. male who returns for follow up appointment for chronic pain and medication refill. He states his pain is located in his lower back radiating into his right lower extremity. He rates his pain 5. His current exercise regime is walking and performing stretching exercises.  Mr. Hoerl Morphine equivalent is 40.00 MME.   Last UDS was Performed on 03/11/2022, it was consistent.     Pain Inventory Average Pain 6 Pain Right Now 5 My pain is intermittent and sharp  In the last 24 hours, has pain interfered with the following? General activity 5 Relation with others 5 Enjoyment of life 7 What TIME of day is your pain at its worst? evening and night Sleep (in general) Fair  Pain is worse with: walking, sitting, and some activites Pain improves with: rest and medication Relief from Meds: 8  Family History  Problem Relation Age of Onset   Liver cancer Mother    Diabetes Father    Liver cancer Brother    Heart attack Brother 14   Heart attack Sister 37   Social History   Socioeconomic History   Marital status: Married    Spouse name: Not on file   Number of children: 3   Years of education: Not on file   Highest education level: Not on file  Occupational History   Occupation: Unemployed  Tobacco Use   Smoking status: Some Days    Packs/day: .5    Types: Cigarettes   Smokeless tobacco: Never  Vaping Use   Vaping Use: Never used  Substance and Sexual Activity   Alcohol use: No    Alcohol/week: 0.0 standard drinks of alcohol   Drug use: No   Sexual activity: Not Currently  Other Topics Concern   Not on file  Social History Narrative   Some caffeine use    Social Determinants of Health   Financial Resource Strain: Not on file  Food Insecurity: Not on file  Transportation Needs: Not on file  Physical Activity: Not on file  Stress:  Not on file  Social Connections: Not on file   Past Surgical History:  Procedure Laterality Date   HERNIA REPAIR     laparoscopic ventral    SPLENECTOMY, TOTAL     Past Surgical History:  Procedure Laterality Date   HERNIA REPAIR     laparoscopic ventral    SPLENECTOMY, TOTAL     Past Medical History:  Diagnosis Date   Cervical radiculopathy    Cirrhosis ([place])    GERD (gastroesophageal reflux disease)    Hairy cell leukemia ([place]) 2005   Last chemo 2006   Hepatitis B infection    Incisional hernia    Lumbar radiculopathy    Memory impairment    Myopathy    Vitamin D deficiency    There were no vitals taken for this visit.  Opioid Risk Score:   Fall Risk Score:  `1  Depression screen [facility] 2/9     03/24/2021   10:17 AM 04/08/2016   10:17 AM 10/06/2015    2:10 PM  Depression screen PHQ 2/9  Decreased Interest 0 3 3  Down, Depressed, Hopeless 0 0 0  PHQ - 2 Score 0 3 3  Altered sleeping   0  Tired, decreased energy   1  Change in appetite   1  Feeling bad or failure about yourself    1  Trouble concentrating   1  Moving slowly or fidgety/restless   0  Suicidal thoughts   0  PHQ-9 Score   7    Review of Systems  Musculoskeletal:  Positive for arthralgias, back pain and neck pain.       Pain only on the right side of the body from shoulder to foot  All other systems reviewed and are negative.      Objective:   Physical Exam Vitals and nursing note reviewed.  Constitutional:      Appearance: Normal appearance.  Cardiovascular:     Rate and Rhythm: Normal rate and regular rhythm.     Pulses: Normal pulses.     Heart sounds: Normal heart sounds.  Pulmonary:     Effort: Pulmonary effort is normal.     Breath sounds: Normal breath sounds.  Musculoskeletal:     Cervical back: Normal range of motion and neck supple.     Comments: Normal Muscle Bulk and Muscle Testing Reveals:  Upper Extremities: Full ROM and Muscle Strength 5/5  Lumbar Paraspinal Tenderness:  L-4-L-5 Mainly Right Side Lower Extremities: Full ROM and Muscle Strength 5/5 Arises from chair with ease Narrow Based  Gait     Skin:    General: Skin is warm and dry.  Neurological:     Mental Status: He is alert and oriented to person, place, and time.  Psychiatric:        Mood and Affect: Mood normal.        Behavior: Behavior normal.         Assessment & Plan:  1. Lumbar Degenerative Disc with Lumbar Radiculitis: Continue HEP as Tolerated. 09/07/2022 Refilled:Tramadol 50 mg take two tablets twice a day as needed for pain #120.  We will continue the opioid monitoring program, this consists of regular clinic visits, examinations, urine drug screen, pill counts as well as use of [place] Controlled Substance Reporting system. A 12 month History has been reviewed on the [place] on 09/07/2022. 2. Chronic Right Shoulder Pain: No complaints today. Continue HEP as Tolerated. Continue to Monitor. 09/07/2022   Return in 6 months

## 2022-10-25 ENCOUNTER — Other Ambulatory Visit: Payer: Self-pay

## 2022-10-25 DIAGNOSIS — C9141 Hairy cell leukemia, in remission: Secondary | ICD-10-CM

## 2022-10-26 ENCOUNTER — Inpatient Hospital Stay: Payer: Medicare HMO | Attending: Hematology

## 2022-10-26 ENCOUNTER — Inpatient Hospital Stay (HOSPITAL_BASED_OUTPATIENT_CLINIC_OR_DEPARTMENT_OTHER): Payer: Medicare HMO | Admitting: Hematology

## 2022-10-26 ENCOUNTER — Other Ambulatory Visit: Payer: Self-pay

## 2022-10-26 VITALS — BP 122/76 | HR 76 | Temp 97.5°F | Resp 16 | Ht 69.0 in | Wt 183.9 lb

## 2022-10-26 DIAGNOSIS — F17210 Nicotine dependence, cigarettes, uncomplicated: Secondary | ICD-10-CM | POA: Diagnosis not present

## 2022-10-26 DIAGNOSIS — C9141 Hairy cell leukemia, in remission: Secondary | ICD-10-CM | POA: Diagnosis not present

## 2022-10-26 DIAGNOSIS — Z8619 Personal history of other infectious and parasitic diseases: Secondary | ICD-10-CM | POA: Diagnosis not present

## 2022-10-26 DIAGNOSIS — Z800 Family history of malignant neoplasm of digestive organs: Secondary | ICD-10-CM | POA: Insufficient documentation

## 2022-10-26 DIAGNOSIS — E538 Deficiency of other specified B group vitamins: Secondary | ICD-10-CM | POA: Diagnosis not present

## 2022-10-26 LAB — CBC WITH DIFFERENTIAL (CANCER CENTER ONLY)
Abs Immature Granulocytes: 0.02 10*3/uL (ref 0.00–0.07)
Basophils Absolute: 0.1 10*3/uL (ref 0.0–0.1)
Basophils Relative: 1 %
Eosinophils Absolute: 0.2 10*3/uL (ref 0.0–0.5)
Eosinophils Relative: 2 %
HCT: 44.2 % (ref 39.0–52.0)
Hemoglobin: 16 g/dL (ref 13.0–17.0)
Immature Granulocytes: 0 %
Lymphocytes Relative: 37 %
Lymphs Abs: 3.2 10*3/uL (ref 0.7–4.0)
MCH: 34.7 pg — ABNORMAL HIGH (ref 26.0–34.0)
MCHC: 36.2 g/dL — ABNORMAL HIGH (ref 30.0–36.0)
MCV: 95.9 fL (ref 80.0–100.0)
Monocytes Absolute: 0.9 10*3/uL (ref 0.1–1.0)
Monocytes Relative: 11 %
Neutro Abs: 4.3 10*3/uL (ref 1.7–7.7)
Neutrophils Relative %: 49 %
Platelet Count: 339 10*3/uL (ref 150–400)
RBC: 4.61 MIL/uL (ref 4.22–5.81)
RDW: 14.5 % (ref 11.5–15.5)
Smear Review: NORMAL
WBC Count: 8.6 10*3/uL (ref 4.0–10.5)
nRBC: 0 % (ref 0.0–0.2)

## 2022-10-26 LAB — CMP (CANCER CENTER ONLY)
ALT: 19 U/L (ref 0–44)
AST: 21 U/L (ref 15–41)
Albumin: 4.3 g/dL (ref 3.5–5.0)
Alkaline Phosphatase: 119 U/L (ref 38–126)
Anion gap: 6 (ref 5–15)
BUN: 19 mg/dL (ref 8–23)
CO2: 23 mmol/L (ref 22–32)
Calcium: 9.4 mg/dL (ref 8.9–10.3)
Chloride: 110 mmol/L (ref 98–111)
Creatinine: 0.78 mg/dL (ref 0.61–1.24)
GFR, Estimated: >60 mL/min (ref >60)
Glucose, Bld: 109 mg/dL — ABNORMAL HIGH (ref 70–99)
Potassium: 3.9 mmol/L (ref 3.5–5.1)
Sodium: 139 mmol/L (ref 135–145)
Total Bilirubin: 0.4 mg/dL (ref 0.3–1.2)
Total Protein: 7.4 g/dL (ref 6.5–8.1)

## 2022-10-26 LAB — LACTATE DEHYDROGENASE: LDH: 124 U/L (ref 98–192)

## 2022-10-26 NOTE — Progress Notes (Signed)
HEMATOLOGY/ONCOLOGY CLINIC NOTE  Date of Service: 10/26/22   Patient Care Team: [facility] [place] as PCP - General (Family Medicine)  CHIEF COMPLAINTS/PURPOSE OF CONSULTATION:  Follow-up for Hairy cell leukemia  HISTORY OF PRESENTING ILLNESS:   [name] is a wonderful 67 y.o. male who has been referred to [place] by Dr. Modesto Charon for evaluation and management of hx of hairy cell leukemia. The pt reports that he is doing well overall.  The pt reports that he saw Dr. Welton Flakes here in 2005 and was diagnosed with hairy cell leukemia. The pt has been in remission since 2006.  The pt notes that he was able to tolerate the Cladribine inpatient chemotherapy treatment as well as possible. The pt is not sure why he was referred here and that his Hgb was elevated is potentially why. The pt notes he was referred by his PCP Dr. Modesto Charon at [facility]. The pt notes that he is still a current everyday smoker at around one pack each day or just under this. The pt notes that he was diagnosed with Hepatitis B in 2005. He notes that to his knowledge he was not diagnosed until after he had started treatment and needing blood transfusions. The pt denies any history of blood transfusions in the past prior to this. The pt currently sees his doctor and gets an ultrasound regarding his liver cirrhosis every six months. This has been very stable. The pt notes that he was not told why his B12 was low. The pt notes he is unable to follow Ramadan as he has to eat and drink something due to his medical exceptions. The pt notes that he does not eat much meat, denying any red meat. The pt notes he has been on B12 replacement for the last two months. According to the pt, he does not feel any different now than he did six months or one year ago. The pt notes that he currently does not work and notes no allergies. The pt notes his mother passed away from liver cancer, denying her having Hepatitis. The pt notes his  brother also passed away from liver cancer, not sure if he had Hepatitis or not. The pt notes that they were living back home in [place] and not receiving very good medical care.  The pt notes no other questions or concerns at this time. The pt denies any exposure to chemicals. The pt used to work in [employer] and notes no specific radiation exposure. The pt notes he currently sees Dr. Levora Angel for his Viread management, but he missed two appointments and is no longer allowed to go there. The pt is currently looking for another a doctor to manage his Viread.  Lab results 06/03/2020 of CBC w/diff and CMP is as follows: all values are WNL except for Hgb of 17.2, MCV of 98.4, Mch of 33.4, Lymph Abs of 3.40K. 06/03/2020 Vitamin B12 of 142.  On review of systems, pt denies fevers, chills, night sweats, sudden weight loss, decreased appetite, leg swelling, and any other symptoms.  INTERVAL HISTORY:  [name] is here for evaluation of hairy cell leukemia. Patient was last seen by me on 10/29/2021 and he was doing well overall.   Patient reports he has been doing well overall without any new or severe medical concerns since our last visit. He denies fever, chills, night sweats, infection issues, new lumps/bumps, abdominal pain, chest pain, back pain, shortness of breath, or leg swelling.  He does complain of occasional feeling of passing out in the morning before eating. He notes that his symptoms gets better once he eats something.   He still smokes around 1 pack of cigarettes a day.   MEDICAL HISTORY:  Past Medical History:  Diagnosis Date   Cervical radiculopathy    Cirrhosis (HCC)    GERD (gastroesophageal reflux disease)    Hairy cell leukemia (HCC) 2005   Last chemo 2006   Hepatitis B infection    Incisional hernia    Lumbar radiculopathy    Memory impairment    Myopathy    Vitamin D deficiency     SURGICAL HISTORY: Past Surgical History:  Procedure Laterality Date    HERNIA REPAIR     laparoscopic ventral    SPLENECTOMY, TOTAL      SOCIAL HISTORY: Social History   Socioeconomic History   Marital status: Married    Spouse name: Not on file   Number of children: 3   Years of education: Not on file   Highest education level: Not on file  Occupational History   Occupation: Unemployed  Tobacco Use   Smoking status: Some Days    Packs/day: .5    Types: Cigarettes   Smokeless tobacco: Never  Vaping Use   Vaping Use: Never used  Substance and Sexual Activity   Alcohol use: No    Alcohol/week: 0.0 standard drinks of alcohol   Drug use: No   Sexual activity: Not Currently  Other Topics Concern   Not on file  Social History Narrative   Some caffeine use    Social Determinants of Health   Financial Resource Strain: Not on file  Food Insecurity: Not on file  Transportation Needs: Not on file  Physical Activity: Not on file  Stress: Not on file  Social Connections: Not on file  Intimate Partner Violence: Not on file    FAMILY HISTORY: Family History  Problem Relation Age of Onset   Liver cancer Mother    Diabetes Father    Liver cancer Brother    Heart attack Brother 46   Heart attack Sister 43    ALLERGIES:  is allergic to metrizamide and iodinated contrast media.  MEDICATIONS:  Current Outpatient Medications  Medication Sig Dispense Refill   Cyanocobalamin 1000 MCG TBCR 1 tablet Orally Once a day for 30 day(s)     famotidine (PEPCID) 20 MG tablet Take 20 mg by mouth at bedtime as needed.     fluticasone (FLONASE) 50 MCG/ACT nasal spray      tenofovir (VIREAD) 300 MG tablet Take 1 tablet (300 mg total) by mouth daily. 30 tablet 6   traMADol (ULTRAM) 50 MG tablet 1 tablet Orally every 8 hrs PRN for 14 days     traMADol (ULTRAM) 50 MG tablet TAKE 2 TABLETS(100 MG) BY MOUTH TWICE DAILY 120 tablet 5   No current facility-administered medications for this visit.    REVIEW OF SYSTEMS:    10 Point review of Systems was done  is negative except as noted above.  PHYSICAL EXAMINATION: ECOG PERFORMANCE STATUS: 0 - Asymptomatic  . Vitals:   10/26/22 1313  BP: 122/76  Pulse: 76  Resp: 16  Temp: (!) 97.5 F (36.4 C)  SpO2: 98%    Filed Weights   10/26/22 1313  Weight: 183 lb 14.4 oz (83.4 kg)    .Body mass index is 27.16 kg/m.  NAD GENERAL:alert, in no acute distress and comfortable SKIN: no acute rashes, no significant lesions  EYES: conjunctiva are pink and non-injected, sclera anicteric OROPHARYNX: MMM, no exudates, no oropharyngeal erythema or ulceration NECK: supple, no JVD LYMPH:  no palpable lymphadenopathy in the cervical, axillary or inguinal regions LUNGS: clear to auscultation b/l with normal respiratory effort HEART: regular rate & rhythm ABDOMEN:  normoactive bowel sounds , non tender, not distended. No palpable hepatosplenomegaly Extremity: no pedal edema PSYCH: alert & oriented x 3 with fluent speech NEURO: no focal motor/sensory deficits   LABORATORY DATA:  I have reviewed the data as listed  .    Latest Ref Rng & Units 10/26/2022   12:57 PM 05/13/2022    3:15 PM 10/29/2021   10:29 AM  CBC  WBC 4.0 - 10.5 K/uL 8.6  7.6  6.6   Hemoglobin 13.0 - 17.0 g/dL 16.1  09.6  04.5   Hematocrit 39.0 - 52.0 % 44.2  48.0  47.5   Platelets 150 - 400 K/uL 339  316.0  330    . CBC    Component Value Date/Time   WBC 8.6 10/26/2022 1257   WBC 7.6 05/13/2022 1515   RBC 4.61 10/26/2022 1257   HGB 16.0 10/26/2022 1257   HGB 16.1 04/09/2009 1024   HGB 14.8 09/16/2005 0822   HCT 44.2 10/26/2022 1257   HCT 47.9 04/09/2009 1024   HCT 43.4 09/16/2005 0822   PLT 339 10/26/2022 1257   PLT 265 04/09/2009 1024   PLT 330 09/16/2005 0822   MCV 95.9 10/26/2022 1257   MCV 99 (H) 04/09/2009 1024   MCV 94.8 09/16/2005 0822   MCH 34.7 (H) 10/26/2022 1257   MCHC 36.2 (H) 10/26/2022 1257   RDW 14.5 10/26/2022 1257   RDW 11.7 04/09/2009 1024   RDW 15.7 (H) 09/16/2005 0822   LYMPHSABS 3.2  10/26/2022 1257   LYMPHSABS 2.6 04/09/2009 1024   LYMPHSABS 1.9 09/16/2005 0822   MONOABS 0.9 10/26/2022 1257   MONOABS 0.9 09/16/2005 0822   EOSABS 0.2 10/26/2022 1257   EOSABS 0.2 04/09/2009 1024   BASOSABS 0.1 10/26/2022 1257   BASOSABS 0.1 04/09/2009 1024   BASOSABS 0.0 09/16/2005 0822    .    Latest Ref Rng & Units 10/26/2022   12:57 PM 05/13/2022    3:15 PM 10/29/2021   10:29 AM  CMP  Glucose 70 - 99 mg/dL 409  89  91   BUN 8 - 23 mg/dL 19  21  17    Creatinine 0.61 - 1.24 mg/dL 8.11  9.14  7.82   Sodium 135 - 145 mmol/L 139  139  139   Potassium 3.5 - 5.1 mmol/L 3.9  4.0  3.8   Chloride 98 - 111 mmol/L 110  105  105   CO2 22 - 32 mmol/L 23  27  28    Calcium 8.9 - 10.3 mg/dL 9.4  9.3  9.5   Total Protein 6.5 - 8.1 g/dL 7.4  7.7  8.0   Total Bilirubin 0.3 - 1.2 mg/dL 0.4  0.4  0.4   Alkaline Phos 38 - 126 U/L 119  104  130   AST 15 - 41 U/L 21  18  23    ALT 0 - 44 U/L 19  15  22     . Lab Results  Component Value Date   LDH 124 10/26/2022    10/07/2020 JAK2   10/07/2020 Flow Cytometry - No monoclonal B-cell population or abnormal T-cell phenotype  identified.    RADIOGRAPHIC STUDIES: I have personally reviewed the radiological images as listed and  agreed with the findings in the report. No results found.  ASSESSMENT & PLAN:   67 yo with   1) H/o Hairy cell leukemia 2) B12 deficiency 3) h/o Hepatitis B  PLAN: -Discussed lab results from today, 10/26/2022, with the patient. CBC and CMP are stable. Patient has no clinical or lab findings suggestive of a hairy-cell leukemia recurrence/progression at this time. -Continue current vitamin B12 replacement with PCP. -Educated the patient on smoking cessation.  -Recommended to follow-up with PCP regarding all cancer screening.   FOLLOW-UP: RTC with Dr Candise Che with labs in 12 months  The total time spent in the appointment was 20 minutes* .  All of the patient's questions were answered with apparent  satisfaction. The patient knows to call the clinic with any problems, questions or concerns.   Wyvonnia Lora MD MS AAHIVMS [facility] Hematology/Oncology Physician [facility]  .*Total Encounter Time as defined by the Centers for Medicare and Medicaid Services includes, in addition to the face-to-face time of a patient visit (documented in the note above) non-face-to-face time: obtaining and reviewing outside history, ordering and reviewing medications, tests or procedures, care coordination (communications with other health care professionals or caregivers) and documentation in the medical record.   I, Ok Edwards, am acting as a Neurosurgeon for Wyvonnia Lora, MD. .I have reviewed the above documentation for accuracy and completeness, and I agree with the above. Johney Maine MD

## 2022-11-03 DIAGNOSIS — C9141 Hairy cell leukemia, in remission: Secondary | ICD-10-CM | POA: Diagnosis not present

## 2022-11-03 DIAGNOSIS — Z125 Encounter for screening for malignant neoplasm of prostate: Secondary | ICD-10-CM | POA: Diagnosis not present

## 2022-11-03 DIAGNOSIS — K7469 Other cirrhosis of liver: Secondary | ICD-10-CM | POA: Diagnosis not present

## 2022-11-03 DIAGNOSIS — F17200 Nicotine dependence, unspecified, uncomplicated: Secondary | ICD-10-CM | POA: Diagnosis not present

## 2022-11-03 DIAGNOSIS — G629 Polyneuropathy, unspecified: Secondary | ICD-10-CM | POA: Diagnosis not present

## 2022-11-03 DIAGNOSIS — B181 Chronic viral hepatitis B without delta-agent: Secondary | ICD-10-CM | POA: Diagnosis not present

## 2022-11-03 DIAGNOSIS — K7460 Unspecified cirrhosis of liver: Secondary | ICD-10-CM | POA: Diagnosis not present

## 2022-11-03 DIAGNOSIS — R799 Abnormal finding of blood chemistry, unspecified: Secondary | ICD-10-CM | POA: Diagnosis not present

## 2022-11-03 DIAGNOSIS — K219 Gastro-esophageal reflux disease without esophagitis: Secondary | ICD-10-CM | POA: Diagnosis not present

## 2022-11-03 DIAGNOSIS — Z0000 Encounter for general adult medical examination without abnormal findings: Secondary | ICD-10-CM | POA: Diagnosis not present

## 2022-11-04 ENCOUNTER — Other Ambulatory Visit: Payer: Self-pay | Admitting: Family Medicine

## 2022-11-04 DIAGNOSIS — F17200 Nicotine dependence, unspecified, uncomplicated: Secondary | ICD-10-CM

## 2022-11-10 ENCOUNTER — Other Ambulatory Visit: Payer: Self-pay

## 2022-11-10 MED ORDER — TENOFOVIR DISOPROXIL FUMARATE 300 MG PO TABS: 300.0000 mg | ORAL_TABLET | Freq: Every day | ORAL | 6 refills | Status: DC

## 2022-12-03 ENCOUNTER — Ambulatory Visit: Payer: Medicare HMO

## 2022-12-03 ENCOUNTER — Inpatient Hospital Stay: Admission: RE | Admit: 2022-12-03 | Payer: Medicare HMO | Source: Ambulatory Visit

## 2022-12-23 ENCOUNTER — Encounter: Payer: Self-pay | Admitting: Gastroenterology

## 2023-01-12 ENCOUNTER — Ambulatory Visit: Payer: Medicare HMO

## 2023-01-21 ENCOUNTER — Ambulatory Visit
Admission: RE | Admit: 2023-01-21 | Discharge: 2023-01-21 | Disposition: A | Discharge status: Home / Self Care | Payer: Medicare HMO | Source: Ambulatory Visit | Attending: Family Medicine | Admitting: Family Medicine

## 2023-01-21 DIAGNOSIS — B181 Chronic viral hepatitis B without delta-agent: Secondary | ICD-10-CM | POA: Diagnosis not present

## 2023-01-21 DIAGNOSIS — M5416 Radiculopathy, lumbar region: Secondary | ICD-10-CM | POA: Diagnosis not present

## 2023-01-21 DIAGNOSIS — F17200 Nicotine dependence, unspecified, uncomplicated: Secondary | ICD-10-CM | POA: Diagnosis not present

## 2023-01-21 DIAGNOSIS — Z136 Encounter for screening for cardiovascular disorders: Secondary | ICD-10-CM | POA: Diagnosis not present

## 2023-01-21 DIAGNOSIS — F17210 Nicotine dependence, cigarettes, uncomplicated: Secondary | ICD-10-CM | POA: Diagnosis not present

## 2023-01-21 DIAGNOSIS — R1013 Epigastric pain: Secondary | ICD-10-CM | POA: Diagnosis not present

## 2023-01-21 DIAGNOSIS — M48061 Spinal stenosis, lumbar region without neurogenic claudication: Secondary | ICD-10-CM | POA: Diagnosis not present

## 2023-01-21 DIAGNOSIS — J189 Pneumonia, unspecified organism: Secondary | ICD-10-CM | POA: Diagnosis not present

## 2023-02-08 ENCOUNTER — Ambulatory Visit
Admission: RE | Admit: 2023-02-08 | Discharge: 2023-02-08 | Disposition: A | Discharge status: Home / Self Care | Payer: Medicare HMO | Source: Ambulatory Visit | Attending: Family Medicine | Admitting: Family Medicine

## 2023-02-08 DIAGNOSIS — F17210 Nicotine dependence, cigarettes, uncomplicated: Secondary | ICD-10-CM | POA: Diagnosis not present

## 2023-02-08 DIAGNOSIS — F17200 Nicotine dependence, unspecified, uncomplicated: Secondary | ICD-10-CM

## 2023-03-03 ENCOUNTER — Other Ambulatory Visit: Payer: Self-pay | Admitting: Registered Nurse

## 2023-03-04 ENCOUNTER — Other Ambulatory Visit: Payer: Self-pay

## 2023-03-04 MED ORDER — TENOFOVIR DISOPROXIL FUMARATE 300 MG PO TABS: 300.0000 mg | ORAL_TABLET | Freq: Every day | ORAL | 1 refills | Status: DC

## 2023-03-07 NOTE — Telephone Encounter (Signed)
Patient has a follow up in October 2024. He is requesting a refill of Tramadol.  Filled  Written  ID  Drug  QTY  Days  Prescriber  RX #  Dispenser  Refill  Daily Dose*  Pymt Type  PMP  02/04/2023 09/07/2022 4  Tramadol Hcl 50 Mg Tablet 120.00 30 Eu Tho [id number] [id number] 5/5 40.00 MME Medicare [place] 01/07/2023 09/07/2022 4  Tramadol Hcl 50 Mg Tablet 120.00 30 Eu Tho [id number] [id number] 4/5 40.00 MME Medicare [place] 12/08/2022 09/07/2022 4  Tramadol Hcl 50 Mg Tablet 120.00 30 Eu Tho [id number] [facility] 3/5 40.00 MME Medicare [place]  If granted please send to [facility] on [address] (Caller aware [place] NP is out of the office today).

## 2023-03-08 ENCOUNTER — Telehealth: Payer: Self-pay | Admitting: Registered Nurse

## 2023-03-08 MED ORDER — TRAMADOL HCL 50 MG PO TABS: ORAL_TABLET | ORAL | 0 refills | Status: DC

## 2023-03-08 NOTE — Telephone Encounter (Signed)
PMP was Reviewed.  Tramadol e-scribed today, he has a scheduled appointment with this provider next week.  Call placed to [name], he verbalizes understanding.

## 2023-03-08 NOTE — Telephone Encounter (Signed)
The wife said she called and left a message twice but didn't get a response back and now he's completely out of medicine

## 2023-03-11 NOTE — Progress Notes (Unsigned)
Subjective:    Patient ID: [name], male    DOB: 12-21-1955, 67 y.o.   MRN: [id number]  HPI: [name] is a 67 y.o. male who returns for follow up appointment for chronic pain and medication refill. He states his pain is located in his lower back radiating into his right lower extremity. He rates his pain 3. His current exercise regime is walking and performing stretching exercises.  [name] Morphine equivalent is 40.00 MME.   UDS ordered today.      Pain Inventory Average Pain 6 Pain Right Now 3 My pain is intermittent, sharp, stabbing, and tingling  In the last 24 hours, has pain interfered with the following? General activity 5 Relation with others 2 Enjoyment of life 6 What TIME of day is your pain at its worst? evening Sleep (in general) Fair  Pain is worse with: walking and inactivity Pain improves with: rest and medication Relief from Meds: 8  Family History  Problem Relation Age of Onset   Liver cancer Mother    Diabetes Father    Liver cancer Brother    Heart attack Brother 33   Heart attack Sister 63   Social History   Socioeconomic History   Marital status: Married    Spouse name: Not on file   Number of children: 3   Years of education: Not on file   Highest education level: Not on file  Occupational History   Occupation: Unemployed  Tobacco Use   Smoking status: Some Days    Current packs/day: 0.50    Types: Cigarettes   Smokeless tobacco: Never  Vaping Use   Vaping status: Never Used  Substance and Sexual Activity   Alcohol use: No    Alcohol/week: 0.0 standard drinks of alcohol   Drug use: No   Sexual activity: Not Currently  Other Topics Concern   Not on file  Social History Narrative   Some caffeine use    Social Determinants of Health   Financial Resource Strain: Not on file  Food Insecurity: Not on file  Transportation Needs: Not on file  Physical Activity: Not on file  Stress: Not on file  Social  Connections: Not on file   Past Surgical History:  Procedure Laterality Date   HERNIA REPAIR     laparoscopic ventral    SPLENECTOMY, TOTAL     Past Surgical History:  Procedure Laterality Date   HERNIA REPAIR     laparoscopic ventral    SPLENECTOMY, TOTAL     Past Medical History:  Diagnosis Date   Cervical radiculopathy    Cirrhosis (HCC)    GERD (gastroesophageal reflux disease)    Hairy cell leukemia (HCC) 2005   Last chemo 2006   Hepatitis B infection    Incisional hernia    Lumbar radiculopathy    Memory impairment    Myopathy    Vitamin D deficiency    BP (!) 142/90   Pulse 65   Ht 5\' 9"  (1.753 m)   Wt 189 lb (85.7 kg)   SpO2 98%   BMI 27.91 kg/m   Opioid Risk Score:   Fall Risk Score:  `1  Depression screen [facility] 2/9     09/07/2022    9:44 AM 03/24/2021   10:17 AM 04/08/2016   10:17 AM 10/06/2015    2:10 PM  Depression screen PHQ 2/9  Decreased Interest 0 0 3 3  Down, Depressed, Hopeless 0 0 0 0  PHQ - 2  Score 0 0 3 3  Altered sleeping    0  Tired, decreased energy    1  Change in appetite    1  Feeling bad or failure about yourself     1  Trouble concentrating    1  Moving slowly or fidgety/restless    0  Suicidal thoughts    0  PHQ-9 Score    7    Review of Systems  Musculoskeletal:        RIGHT SIDE PAIN FROM SHOULDERS DOWN TO RIGHT LEG TO FEET  All other systems reviewed and are negative.      Objective:   Physical Exam Vitals and nursing note reviewed.  Constitutional:      Appearance: Normal appearance.  Cardiovascular:     Rate and Rhythm: Normal rate and regular rhythm.     Pulses: Normal pulses.     Heart sounds: Normal heart sounds.  Pulmonary:     Effort: Pulmonary effort is normal.     Breath sounds: Normal breath sounds.  Musculoskeletal:     Cervical back: Normal range of motion and neck supple.     Comments: Normal Muscle Bulk and Muscle Testing Reveals:  Upper Extremities: Full ROM and Muscle Strength 5/5  Lumbar  Paraspinal Tenderness: L-4-L-5 Lower Extremities: Full ROM and Muscle Strength 5/5 Arises from Chair with ease Narrow Based  Gait     Skin:    General: Skin is warm and dry.  Neurological:     Mental Status: He is alert and oriented to person, place, and time.  Psychiatric:        Mood and Affect: Mood normal.        Behavior: Behavior normal.         Assessment & Plan:  1. Lumbar Degenerative Disc with Lumbar Radiculitis: Continue HEP as Tolerated. 03/14/2023 Refilled:Tramadol 50 mg take two tablets twice a day as needed for pain #120.  We will continue the opioid monitoring program, this consists of regular clinic visits, examinations, urine drug screen, pill counts as well as use of [place] Controlled Substance Reporting system. A 12 month History has been reviewed on the [place] Controlled Substance Reporting System on 03/14/2023. 2. Chronic Right Shoulder Pain: No complaints today. Continue HEP as Tolerated. Continue to Monitor. 03/14/2023   Return in 6 months

## 2023-03-14 ENCOUNTER — Encounter: Payer: Self-pay | Admitting: Registered Nurse

## 2023-03-14 ENCOUNTER — Encounter: Payer: Medicare HMO | Attending: Registered Nurse | Admitting: Registered Nurse

## 2023-03-14 VITALS — BP 142/90 | HR 65 | Ht 69.0 in | Wt 189.0 lb

## 2023-03-14 DIAGNOSIS — Z79891 Long term (current) use of opiate analgesic: Secondary | ICD-10-CM | POA: Diagnosis not present

## 2023-03-14 DIAGNOSIS — Z5181 Encounter for therapeutic drug level monitoring: Secondary | ICD-10-CM | POA: Diagnosis not present

## 2023-03-14 DIAGNOSIS — M5416 Radiculopathy, lumbar region: Secondary | ICD-10-CM | POA: Diagnosis not present

## 2023-03-14 DIAGNOSIS — M48061 Spinal stenosis, lumbar region without neurogenic claudication: Secondary | ICD-10-CM | POA: Diagnosis not present

## 2023-03-14 DIAGNOSIS — G894 Chronic pain syndrome: Secondary | ICD-10-CM | POA: Diagnosis not present

## 2023-03-14 MED ORDER — TRAMADOL HCL 50 MG PO TABS: ORAL_TABLET | ORAL | 0 refills | Status: DC

## 2023-03-17 LAB — TOXASSURE SELECT,+ANTIDEPR,UR

## 2023-03-30 ENCOUNTER — Other Ambulatory Visit (INDEPENDENT_AMBULATORY_CARE_PROVIDER_SITE_OTHER): Payer: Medicare HMO

## 2023-03-30 ENCOUNTER — Encounter: Payer: Self-pay | Admitting: Gastroenterology

## 2023-03-30 ENCOUNTER — Ambulatory Visit (INDEPENDENT_AMBULATORY_CARE_PROVIDER_SITE_OTHER): Payer: Medicare HMO | Admitting: Gastroenterology

## 2023-03-30 VITALS — BP 116/72 | HR 84 | Ht 69.0 in | Wt 188.0 lb

## 2023-03-30 DIAGNOSIS — K7460 Unspecified cirrhosis of liver: Secondary | ICD-10-CM | POA: Diagnosis not present

## 2023-03-30 DIAGNOSIS — B1910 Unspecified viral hepatitis B without hepatic coma: Secondary | ICD-10-CM | POA: Diagnosis not present

## 2023-03-30 DIAGNOSIS — Z800 Family history of malignant neoplasm of digestive organs: Secondary | ICD-10-CM | POA: Diagnosis not present

## 2023-03-30 DIAGNOSIS — Z1211 Encounter for screening for malignant neoplasm of colon: Secondary | ICD-10-CM | POA: Diagnosis not present

## 2023-03-30 DIAGNOSIS — R195 Other fecal abnormalities: Secondary | ICD-10-CM

## 2023-03-30 DIAGNOSIS — B181 Chronic viral hepatitis B without delta-agent: Secondary | ICD-10-CM

## 2023-03-30 LAB — COMPREHENSIVE METABOLIC PANEL
ALT: 20 U/L (ref 0–53)
AST: 22 U/L (ref 0–37)
Albumin: 4.4 g/dL (ref 3.5–5.2)
Alkaline Phosphatase: 106 U/L (ref 39–117)
BUN: 19 mg/dL (ref 6–23)
CO2: 25 meq/L (ref 19–32)
Calcium: 9.3 mg/dL (ref 8.4–10.5)
Chloride: 106 meq/L (ref 96–112)
Creatinine, Ser: 0.82 mg/dL (ref 0.40–1.50)
GFR: 90.55 mL/min (ref >60.00)
Glucose, Bld: 91 mg/dL (ref 70–99)
Potassium: 3.9 meq/L (ref 3.5–5.1)
Sodium: 137 meq/L (ref 135–145)
Total Bilirubin: 0.3 mg/dL (ref 0.2–1.2)
Total Protein: 7.7 g/dL (ref 6.0–8.3)

## 2023-03-30 LAB — CBC WITH DIFFERENTIAL/PLATELET
Basophils Absolute: 0 10*3/uL (ref 0.0–0.1)
Basophils Relative: 0.5 % (ref 0.0–3.0)
Eosinophils Absolute: 0.1 10*3/uL (ref 0.0–0.7)
Eosinophils Relative: 1.6 % (ref 0.0–5.0)
HCT: 48.4 % (ref 39.0–52.0)
Hemoglobin: 16 g/dL (ref 13.0–17.0)
Lymphocytes Relative: 40.7 % (ref 12.0–46.0)
Lymphs Abs: 3.5 10*3/uL (ref 0.7–4.0)
MCHC: 33 g/dL (ref 30.0–36.0)
MCV: 100.9 fL — ABNORMAL HIGH (ref 78.0–100.0)
Monocytes Absolute: 1 10*3/uL (ref 0.1–1.0)
Monocytes Relative: 11.3 % (ref 3.0–12.0)
Neutro Abs: 4 10*3/uL (ref 1.4–7.7)
Neutrophils Relative %: 45.9 % (ref 43.0–77.0)
Platelets: 331 10*3/uL (ref 150.0–400.0)
RBC: 4.8 Mil/uL (ref 4.22–5.81)
RDW: 13.6 % (ref 11.5–15.5)
WBC: 8.6 10*3/uL (ref 4.0–10.5)

## 2023-03-30 LAB — PROTIME-INR
INR: 1.1 {ratio} — ABNORMAL HIGH (ref 0.8–1.0)
Prothrombin Time: 11.9 s (ref 9.6–13.1)

## 2023-03-30 NOTE — Progress Notes (Deleted)
[facility] Consult Note:  History: [name] 03/30/2023  Referring provider: Jackelyn Poling, DO  Reason for consult/chief complaint: No chief complaint on file.   Subjective  HPI: [name] follows up for his chronic hepatitis B virus infection and cirrhosis, last seen here December 2023.  He had previously declined screening colonoscopy but was agreeable to a Cologuard.  He had a positive result but then declined a colonoscopy.  He was given some time to consider it further, contacted again in March of this year and again declined a colonoscopy.  He is also previously declined upper endoscopy for variceal screening.  He has hairy cell leukemia in remission and followed by hematology. Continues daily treatment with Viread for his hepatitis B.  Negative viral load May 2023.  No liver masses seen on ultrasound May and June 2023, though somewhat limited acoustic windows.  MRI liver planned at last visit (has IV CT dye allergy), ordered but has not been done.  Last AFP normal December 2023.  Family history of liver cancer ***   ROS:  Review of Systems   Past Medical History: Past Medical History:  Diagnosis Date   Cervical radiculopathy    Cirrhosis (HCC)    GERD (gastroesophageal reflux disease)    Hairy cell leukemia (HCC) 2005   Last chemo 2006   Hepatitis B infection    Incisional hernia    Lumbar radiculopathy    Memory impairment    Myopathy    Vitamin D deficiency      Past Surgical History: Past Surgical History:  Procedure Laterality Date   HERNIA REPAIR     laparoscopic ventral    SPLENECTOMY, TOTAL       Family History: Family History  Problem Relation Age of Onset   Liver cancer Mother    Diabetes Father    Liver cancer Brother    Heart attack Brother 42   Heart attack Sister 80    Social History: Social History   Socioeconomic History   Marital status: Married    Spouse name: Not on file   Number of children: 3    Years of education: Not on file   Highest education level: Not on file  Occupational History   Occupation: Unemployed  Tobacco Use   Smoking status: Some Days    Current packs/day: 0.50    Types: Cigarettes   Smokeless tobacco: Never  Vaping Use   Vaping status: Never Used  Substance and Sexual Activity   Alcohol use: No    Alcohol/week: 0.0 standard drinks of alcohol   Drug use: No   Sexual activity: Not Currently  Other Topics Concern   Not on file  Social History Narrative   Some caffeine use    Social Determinants of Health   Financial Resource Strain: Not on file  Food Insecurity: Not on file  Transportation Needs: Not on file  Physical Activity: Not on file  Stress: Not on file  Social Connections: Not on file    Allergies: Allergies  Allergen Reactions   Metrizamide Swelling    Itching and swelling with pre meds in 2005; non ionic cm   Iodinated Contrast Media Other (See Comments) and Swelling    Itching and swelling with pre meds in 2005; non ionic cm  Other Reaction(s): rash/itching    Outpatient Meds: Current Outpatient Medications  Medication Sig Dispense Refill   Cyanocobalamin (VITAMIN B12) 1000 MCG TBCR 1 tablet Orally Once a day for 30 day(s)  famotidine (PEPCID) 20 MG tablet 1 tablet at bedtime as needed Orally Once a day for 30 days     fluticasone (FLONASE) 50 MCG/ACT nasal spray      tenofovir (VIREAD) 300 MG tablet Take 1 tablet (300 mg total) by mouth daily. 30 tablet 1   traMADol (ULTRAM) 50 MG tablet TAKE 2 TABLETS(100 MG) BY MOUTH TWICE DAILY 120 tablet 0   No current facility-administered medications for this visit.      ___________________________________________________________________ Objective   Exam:  There were no vitals taken for this visit. Wt Readings from Last 3 Encounters:  03/14/23 189 lb (85.7 kg)  10/26/22 183 lb 14.4 oz (83.4 kg)  09/07/22 182 lb (82.6 kg)    General: ***  Eyes: sclera anicteric, no  redness ENT: oral mucosa moist without lesions, no cervical or supraclavicular lymphadenopathy CV: ***, no JVD, no peripheral edema Resp: clear to auscultation bilaterally, normal RR and effort noted GI: soft, *** tenderness, with active bowel sounds. No guarding or palpable organomegaly noted. Skin; warm and dry, no rash or jaundice noted Neuro: awake, alert and oriented x 3. Normal gross motor function and fluent speech  Labs:     Latest Ref Rng & Units 10/26/2022   12:57 PM 05/13/2022    3:15 PM 10/29/2021   10:29 AM  CBC  WBC 4.0 - 10.5 K/uL 8.6  7.6  6.6   Hemoglobin 13.0 - 17.0 g/dL 46.9  62.9  52.8   Hematocrit 39.0 - 52.0 % 44.2  48.0  47.5   Platelets 150 - 400 K/uL 339  316.0  330       Latest Ref Rng & Units 10/26/2022   12:57 PM 05/13/2022    3:15 PM 10/29/2021   10:29 AM  CMP  Glucose 70 - 99 mg/dL 413  89  91   BUN 8 - 23 mg/dL 19  21  17    Creatinine 0.61 - 1.24 mg/dL 2.44  0.10  2.72   Sodium 135 - 145 mmol/L 139  139  139   Potassium 3.5 - 5.1 mmol/L 3.9  4.0  3.8   Chloride 98 - 111 mmol/L 110  105  105   CO2 22 - 32 mmol/L 23  27  28    Calcium 8.9 - 10.3 mg/dL 9.4  9.3  9.5   Total Protein 6.5 - 8.1 g/dL 7.4  7.7  8.0   Total Bilirubin 0.3 - 1.2 mg/dL 0.4  0.4  0.4   Alkaline Phos 38 - 126 U/L 119  104  130   AST 15 - 41 U/L 21  18  23    ALT 0 - 44 U/L 19  15  22     Hepatitis B viral load again undetected December 2023  Radiologic Studies: None since last visit  Assessment: No diagnosis found.  ***  Plan:  ***  Thank you for the courtesy of this consult.  Please call me with any questions or concerns.  Charlie Pitter III  CC: Referring provider noted above

## 2023-03-30 NOTE — Patient Instructions (Signed)
You have been scheduled for an abdominal ultrasound at [facility] Radiology (1st floor of hospital) on 04/06/2023 at 8:30am. Please arrive 30 minutes prior to your appointment for registration. Make certain not to have anything to eat or drink 6 hours prior to your appointment. Should you need to reschedule your appointment, please contact radiology at [phone]. This test typically takes about 30 minutes to perform.  Your provider has requested that you go to the basement level for lab work before leaving today. Press "B" on the elevator. The lab is located at the first door on the left as you exit the elevator.  _______________________________________________________  If your blood pressure at your visit was 140/90 or greater, please contact your primary care physician to follow up on this.  _______________________________________________________  If you are age 19 or older, your body mass index should be between 23-30. Your Body mass index is 27.76 kg/m. If this is out of the aforementioned range listed, please consider follow up with your Primary Care Provider.  If you are age 39 or younger, your body mass index should be between 19-25. Your Body mass index is 27.76 kg/m. If this is out of the aformentioned range listed, please consider follow up with your Primary Care Provider.   ________________________________________________________  The [facility] providers would like to encourage you to use [facility] to communicate with providers for non-urgent requests or questions.  Due to long hold times on the telephone, sending your provider a message by [facility] may be a faster and more efficient way to get a response.  Please allow 48 business hours for a response.  Please remember that this is for non-urgent requests.  _______________________________________________________ It was a pleasure to see you today!  Thank you for trusting me with your gastrointestinal care!

## 2023-03-30 NOTE — Progress Notes (Signed)
[facility] Consult Note:  History: [name] 03/30/2023  Referring provider: Jackelyn Poling, DO  Reason for consult/chief complaint: Cirrhosis (Patient denies any Sx, feeling good, not drinking alcohol, no swelling, etc. Patient did not have MRI ordered in Dec 23 due to allergy to the contrast. Last liver imaging in June 2023)   Subjective  HPI: [name] follows up for his chronic hepatitis B virus infection and cirrhosis, last seen here December 2023.  He had previously declined screening colonoscopy but was agreeable to a Cologuard.  He had a positive result but then declined a colonoscopy.  He was given some time to consider it further, contacted again in March of this year and again declined a colonoscopy.  He is also previously declined upper endoscopy for variceal screening.  He has hairy cell leukemia in remission and followed by hematology. Continues daily treatment with Viread for his hepatitis B.  Negative viral load May 2023.  No liver masses seen on ultrasound May and June 2023, though somewhat limited acoustic windows.  MRI liver planned at last visit (has IV CT dye allergy), ordered but has not been done.  Last AFP normal December 2023.  Family history of liver cancer __________________  Today, he reports feeling well overall with no GI complains. He reports compliance and good tolerance of Viread 300 mg. We discussed the recommendation for a screening colonoscopy based on recent history. He continues to express no interest in undergoing a colonoscopy despite having a positive cologuard earlier this year. We also discussed alternative testing options to CT or MRI to examine his liver. He is disagreeable to an MRI at this time but is interested in an RUQ ultra sound.  Patient denies diarrhea, constipation, nausea, blood in stool, black stool, vomiting, abdominal pain, bloating, unintentional weight loss, reflux, dysphagia.   ROS:  Review of  Systems   Past Medical History: Past Medical History:  Diagnosis Date   Cervical radiculopathy    Cirrhosis (HCC)    GERD (gastroesophageal reflux disease)    Hairy cell leukemia (HCC) 2005   Last chemo 2006   Hepatitis B infection    Incisional hernia    Lumbar radiculopathy    Memory impairment    Myopathy    Vitamin D deficiency      Past Surgical History: Past Surgical History:  Procedure Laterality Date   HERNIA REPAIR     laparoscopic ventral    SPLENECTOMY, TOTAL       Family History: Family History  Problem Relation Age of Onset   Liver cancer Mother    Diabetes Father    Heart attack Sister 37   Liver cancer Brother    Heart attack Brother 32   Colon cancer Neg Hx     Social History: Social History   Socioeconomic History   Marital status: Married    Spouse name: Not on file   Number of children: 3   Years of education: Not on file   Highest education level: Not on file  Occupational History   Occupation: Unemployed  Tobacco Use   Smoking status: Some Days    Current packs/day: 0.50    Types: Cigarettes   Smokeless tobacco: Never  Vaping Use   Vaping status: Never Used  Substance and Sexual Activity   Alcohol use: No    Alcohol/week: 0.0 standard drinks of alcohol   Drug use: No   Sexual activity: Not Currently  Other Topics Concern   Not on file  Social  History Narrative   Some caffeine use    Social Determinants of Corporate investment banker Strain: Not on file  Food Insecurity: Not on file  Transportation Needs: Not on file  Physical Activity: Not on file  Stress: Not on file  Social Connections: Not on file    Allergies: Allergies  Allergen Reactions   Metrizamide Swelling    Itching and swelling with pre meds in 2005; non ionic cm   Iodinated Contrast Media Other (See Comments) and Swelling    Itching and swelling with pre meds in 2005; non ionic cm  Other Reaction(s): rash/itching    Outpatient Meds: Current  Outpatient Medications  Medication Sig Dispense Refill   Cyanocobalamin (VITAMIN B12) 1000 MCG TBCR 1 tablet Orally Once a day for 30 day(s)     tenofovir (VIREAD) 300 MG tablet Take 1 tablet (300 mg total) by mouth daily. 30 tablet 1   traMADol (ULTRAM) 50 MG tablet TAKE 2 TABLETS(100 MG) BY MOUTH TWICE DAILY (Patient taking differently: Take 50 mg by mouth 2 (two) times daily. TAKE 2 TABLETS(100 MG) BY MOUTH TWICE DAILY prn) 120 tablet 0   No current facility-administered medications for this visit.      ___________________________________________________________________ Objective   Exam:  BP 116/72   Pulse 84   Ht 5\' 9"  (1.753 m)   Wt 188 lb (85.3 kg)   SpO2 98%   BMI 27.76 kg/m  Wt Readings from Last 3 Encounters:  03/30/23 188 lb (85.3 kg)  03/14/23 189 lb (85.7 kg)  10/26/22 183 lb 14.4 oz (83.4 kg)   General: well-appearing   Eyes: sclera anicteric, no redness ENT: oral mucosa moist without lesions, no cervical or supraclavicular lymphadenopathy CV: RRR, no JVD, no peripheral edema Resp: clear to auscultation bilaterally, normal RR and effort noted GI: soft, no tenderness, with active bowel sounds. No guarding or palpable organomegaly noted. Skin; warm and dry, no rash or jaundice noted Neuro: awake, alert and oriented x 3. Normal gross motor function and fluent speech   Labs:     Latest Ref Rng & Units 10/26/2022   12:57 PM 05/13/2022    3:15 PM 10/29/2021   10:29 AM  CBC  WBC 4.0 - 10.5 K/uL 8.6  7.6  6.6   Hemoglobin 13.0 - 17.0 g/dL 16.1  09.6  04.5   Hematocrit 39.0 - 52.0 % 44.2  48.0  47.5   Platelets 150 - 400 K/uL 339  316.0  330       Latest Ref Rng & Units 10/26/2022   12:57 PM 05/13/2022    3:15 PM 10/29/2021   10:29 AM  CMP  Glucose 70 - 99 mg/dL 409  89  91   BUN 8 - 23 mg/dL 19  21  17    Creatinine 0.61 - 1.24 mg/dL 8.11  9.14  7.82   Sodium 135 - 145 mmol/L 139  139  139   Potassium 3.5 - 5.1 mmol/L 3.9  4.0  3.8   Chloride 98 - 111  mmol/L 110  105  105   CO2 22 - 32 mmol/L 23  27  28    Calcium 8.9 - 10.3 mg/dL 9.4  9.3  9.5   Total Protein 6.5 - 8.1 g/dL 7.4  7.7  8.0   Total Bilirubin 0.3 - 1.2 mg/dL 0.4  0.4  0.4   Alkaline Phos 38 - 126 U/L 119  104  130   AST 15 - 41 U/L 21  18  23   ALT 0 - 44 U/L 19  15  22     Hepatitis B viral load again undetected December 2023  Radiologic Studies: None since last visit  Assessment: Chronic hepatitis B virus infection (HCC) - Plan: AFP tumor marker, CBC w/Diff, Comp Met (CMET), INR/PT, Hepatitis B DNA, ultraquantitative, PCR, US ABDOMEN LIMITED RUQ (LIVER/GB)  Family history of liver cancer - Plan: AFP tumor marker, CBC w/Diff, Comp Met (CMET), INR/PT, Hepatitis B DNA, ultraquantitative, PCR, US ABDOMEN LIMITED RUQ (LIVER/GB)  Positive colorectal cancer screening using Cologuard test - Plan: AFP tumor marker, CBC w/Diff, Comp Met (CMET), INR/PT, Hepatitis B DNA, ultraquantitative, PCR, US ABDOMEN LIMITED RUQ (LIVER/GB)  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 No apparent stigmata of advanced liver disease, no complications such as GI bleeding encephalopathy or ascites.  Family history of liver cancer, and he is due for [facility] screening.  He still does not want to proceed with an MRI because he is very afraid he will have a reaction to the dye because of his previous history of iodinated CT dye allergy.  I again explained to him they are completely different contrast agents, and that MRI dye (gadolinium) has no iodine.  Since it is possible a person could have an unexpected reaction to gadolinium (though not a cross-reactivity with iodinated CT dye), I was unable to "guarantee" that he would not have  a reaction from MRI dye, so he did not wish to proceed with that test. He also again declines a colonoscopy for his positive Cologuard because he says he feels well and does not want to go through the procedure.  He was agreeable to some updated lab work including a CBC, c-Met, AFP and PT/INR as well as a right upper quadrant ultrasound for HCC screening.  If he has elevated AFP and/or abnormality on ultrasound that requires cross-sectional imaging, then he says he might be agreeable to doing the MRI.  He asked if either MRI or CT could be done without contrast, and I told him I would not plan to do that because the studies would be limited.  Plan: -Continue Viread 300 mg once daily-refill when needed -CMET, CBC, AFP, Hepatitis B virus DNA viral load, PT/INR -[place] RUQ   Thank you for the courtesy of this consult.  Please call me with any questions or concerns.   I,Safa M Kadhim,acting as a scribe for Charlie Pitter III, MD.,have documented all relevant documentation on the behalf of Sherrilyn Rist, MD,as directed by  Sherrilyn Rist, MD while in the presence of Sherrilyn Rist, MD.   Marvis Repress III, MD, have reviewed all documentation for this visit. The documentation on 03/30/23 for the exam, diagnosis, procedures, and orders are all accurate and complete.    CC: Referring provider noted above

## 2023-04-02 LAB — HEPATITIS B DNA, ULTRAQUANTITATIVE, PCR
Hepatitis B DNA: NOT DETECTED [IU]/mL
Hepatitis B virus DNA: NOT DETECTED {Log_IU}/mL

## 2023-04-02 LAB — AFP TUMOR MARKER: AFP-Tumor Marker: 2.6 ng/mL (ref <6.1)

## 2023-04-05 ENCOUNTER — Ambulatory Visit (HOSPITAL_COMMUNITY)
Admission: RE | Admit: 2023-04-05 | Discharge: 2023-04-05 | Disposition: A | Discharge status: Home / Self Care | Payer: Medicare HMO | Source: Ambulatory Visit | Attending: Gastroenterology | Admitting: Gastroenterology

## 2023-04-05 DIAGNOSIS — R195 Other fecal abnormalities: Secondary | ICD-10-CM | POA: Insufficient documentation

## 2023-04-05 DIAGNOSIS — N281 Cyst of kidney, acquired: Secondary | ICD-10-CM | POA: Diagnosis not present

## 2023-04-05 DIAGNOSIS — B1910 Unspecified viral hepatitis B without hepatic coma: Secondary | ICD-10-CM | POA: Diagnosis not present

## 2023-04-05 DIAGNOSIS — Z800 Family history of malignant neoplasm of digestive organs: Secondary | ICD-10-CM | POA: Insufficient documentation

## 2023-04-05 DIAGNOSIS — B181 Chronic viral hepatitis B without delta-agent: Secondary | ICD-10-CM | POA: Diagnosis not present

## 2023-04-05 DIAGNOSIS — K7460 Unspecified cirrhosis of liver: Secondary | ICD-10-CM | POA: Diagnosis not present

## 2023-04-05 DIAGNOSIS — K7689 Other specified diseases of liver: Secondary | ICD-10-CM | POA: Diagnosis not present

## 2023-04-06 ENCOUNTER — Ambulatory Visit (HOSPITAL_COMMUNITY): Payer: Medicare HMO

## 2023-04-07 ENCOUNTER — Telehealth: Payer: Self-pay | Admitting: Registered Nurse

## 2023-04-07 MED ORDER — TRAMADOL HCL 50 MG PO TABS: ORAL_TABLET | ORAL | 5 refills | Status: DC

## 2023-04-07 NOTE — Telephone Encounter (Signed)
Please call patient about his Tramadol prescription.  She said the husband left a message yesterday about it but I didn't see anything in system.

## 2023-04-07 NOTE — Telephone Encounter (Signed)
PMP was Reviewed Tramadol e-scribed to pharmacy, call placed to Mr. Kadri regarding the above, he verbalizes understanding.

## 2023-05-03 DIAGNOSIS — J439 Emphysema, unspecified: Secondary | ICD-10-CM | POA: Diagnosis not present

## 2023-05-03 DIAGNOSIS — K7460 Unspecified cirrhosis of liver: Secondary | ICD-10-CM | POA: Diagnosis not present

## 2023-05-03 DIAGNOSIS — I700 Atherosclerosis of aorta: Secondary | ICD-10-CM | POA: Diagnosis not present

## 2023-05-03 DIAGNOSIS — F17210 Nicotine dependence, cigarettes, uncomplicated: Secondary | ICD-10-CM | POA: Diagnosis not present

## 2023-05-03 DIAGNOSIS — F17200 Nicotine dependence, unspecified, uncomplicated: Secondary | ICD-10-CM | POA: Diagnosis not present

## 2023-05-03 DIAGNOSIS — K7469 Other cirrhosis of liver: Secondary | ICD-10-CM | POA: Diagnosis not present

## 2023-05-03 DIAGNOSIS — Z716 Tobacco abuse counseling: Secondary | ICD-10-CM | POA: Diagnosis not present

## 2023-05-04 ENCOUNTER — Telehealth: Payer: Self-pay | Admitting: Registered Nurse

## 2023-05-04 ENCOUNTER — Other Ambulatory Visit: Payer: Self-pay | Admitting: Registered Nurse

## 2023-05-04 MED ORDER — TRAMADOL HCL 50 MG PO TABS: ORAL_TABLET | ORAL | 5 refills | Status: DC

## 2023-05-04 NOTE — Telephone Encounter (Signed)
PMP was Reviewed Tramadol e-scribed to pharmacy. Call placed to Mr. Pall, no answer. Left message to return the call.

## 2023-06-07 ENCOUNTER — Telehealth: Payer: Self-pay | Admitting: Gastroenterology

## 2023-06-07 ENCOUNTER — Other Ambulatory Visit: Payer: Self-pay

## 2023-06-07 ENCOUNTER — Other Ambulatory Visit (HOSPITAL_COMMUNITY): Payer: Self-pay

## 2023-06-07 ENCOUNTER — Telehealth: Payer: Self-pay | Admitting: Pharmacy Technician

## 2023-06-07 MED ORDER — TENOFOVIR DISOPROXIL FUMARATE 300 MG PO TABS: 300.0000 mg | ORAL_TABLET | Freq: Every day | ORAL | 1 refills | Status: DC

## 2023-06-07 NOTE — Telephone Encounter (Signed)
Inbound call from [facility] stating they have faxed over PA information for Viread. Please advise.

## 2023-06-07 NOTE — Telephone Encounter (Signed)
 Pharmacy Patient Advocate Encounter  Insurance verification completed-PA Not required.    The patient is insured through [facility]   Per [facility]- Plan response on 06/07/23- No PA is required for Tenofovir . Key: [id number]  Test claim processes for zero copay.  Found fax from pharmacy- They are requesting PA for Brand Viread  150mg  tabs. Called pharmacy, rep advised claim is  stuck and to call back in an hour

## 2023-06-07 NOTE — Telephone Encounter (Signed)
 Spoke to pharmacy, they do not have prescription for Tenofovir  300mg  tablets- once daily on file. Patient had previously been receiving this dose.  Can a new rx be sent? Or is there a reason patient needed to switch to the 150mg  twice daily, unable to locate in the chart?

## 2023-06-07 NOTE — Telephone Encounter (Signed)
Irving Burton,  Can you look into this please.  Thank you.

## 2023-07-13 ENCOUNTER — Other Ambulatory Visit: Payer: Self-pay

## 2023-07-13 ENCOUNTER — Encounter (HOSPITAL_COMMUNITY): Payer: Self-pay

## 2023-07-13 ENCOUNTER — Emergency Department (HOSPITAL_COMMUNITY): Payer: Medicare HMO

## 2023-07-13 ENCOUNTER — Emergency Department (HOSPITAL_COMMUNITY)
Admission: EM | Admit: 2023-07-13 | Discharge: 2023-07-13 | Disposition: A | Discharge status: Home / Self Care | Payer: Medicare HMO | Attending: Emergency Medicine | Admitting: Emergency Medicine

## 2023-07-13 DIAGNOSIS — D72829 Elevated white blood cell count, unspecified: Secondary | ICD-10-CM | POA: Diagnosis not present

## 2023-07-13 DIAGNOSIS — S299XXA Unspecified injury of thorax, initial encounter: Secondary | ICD-10-CM | POA: Diagnosis not present

## 2023-07-13 DIAGNOSIS — S161XXA Strain of muscle, fascia and tendon at neck level, initial encounter: Secondary | ICD-10-CM | POA: Diagnosis not present

## 2023-07-13 DIAGNOSIS — R718 Other abnormality of red blood cells: Secondary | ICD-10-CM | POA: Diagnosis not present

## 2023-07-13 DIAGNOSIS — S199XXA Unspecified injury of neck, initial encounter: Secondary | ICD-10-CM | POA: Diagnosis present

## 2023-07-13 DIAGNOSIS — R0789 Other chest pain: Secondary | ICD-10-CM | POA: Insufficient documentation

## 2023-07-13 DIAGNOSIS — F17200 Nicotine dependence, unspecified, uncomplicated: Secondary | ICD-10-CM | POA: Insufficient documentation

## 2023-07-13 DIAGNOSIS — Z041 Encounter for examination and observation following transport accident: Secondary | ICD-10-CM | POA: Diagnosis not present

## 2023-07-13 DIAGNOSIS — J439 Emphysema, unspecified: Secondary | ICD-10-CM | POA: Diagnosis not present

## 2023-07-13 DIAGNOSIS — Y92410 Unspecified street and highway as the place of occurrence of the external cause: Secondary | ICD-10-CM | POA: Insufficient documentation

## 2023-07-13 DIAGNOSIS — R918 Other nonspecific abnormal finding of lung field: Secondary | ICD-10-CM | POA: Diagnosis not present

## 2023-07-13 DIAGNOSIS — R519 Headache, unspecified: Secondary | ICD-10-CM | POA: Diagnosis not present

## 2023-07-13 DIAGNOSIS — I700 Atherosclerosis of aorta: Secondary | ICD-10-CM | POA: Diagnosis not present

## 2023-07-13 DIAGNOSIS — R079 Chest pain, unspecified: Secondary | ICD-10-CM | POA: Diagnosis not present

## 2023-07-13 DIAGNOSIS — I10 Essential (primary) hypertension: Secondary | ICD-10-CM | POA: Diagnosis not present

## 2023-07-13 LAB — BASIC METABOLIC PANEL
Anion gap: 11 (ref 5–15)
BUN: 15 mg/dL (ref 8–23)
CO2: 21 mmol/L — ABNORMAL LOW (ref 22–32)
Calcium: 9.4 mg/dL (ref 8.9–10.3)
Chloride: 108 mmol/L (ref 98–111)
Creatinine, Ser: 0.73 mg/dL (ref 0.61–1.24)
GFR, Estimated: >60 mL/min (ref >60)
Glucose, Bld: 95 mg/dL (ref 70–99)
Potassium: 4 mmol/L (ref 3.5–5.1)
Sodium: 140 mmol/L (ref 135–145)

## 2023-07-13 LAB — CBC WITH DIFFERENTIAL/PLATELET
Abs Immature Granulocytes: 0.06 10*3/uL (ref 0.00–0.07)
Basophils Absolute: 0.1 10*3/uL (ref 0.0–0.1)
Basophils Relative: 0 %
Eosinophils Absolute: 0.1 10*3/uL (ref 0.0–0.5)
Eosinophils Relative: 1 %
HCT: 50 % (ref 39.0–52.0)
Hemoglobin: 17.1 g/dL — ABNORMAL HIGH (ref 13.0–17.0)
Immature Granulocytes: 1 %
Lymphocytes Relative: 24 %
Lymphs Abs: 3.1 10*3/uL (ref 0.7–4.0)
MCH: 33.9 pg (ref 26.0–34.0)
MCHC: 34.2 g/dL (ref 30.0–36.0)
MCV: 99.2 fL (ref 80.0–100.0)
Monocytes Absolute: 0.8 10*3/uL (ref 0.1–1.0)
Monocytes Relative: 7 %
Neutro Abs: 8.6 10*3/uL — ABNORMAL HIGH (ref 1.7–7.7)
Neutrophils Relative %: 67 %
Platelets: 313 10*3/uL (ref 150–400)
RBC: 5.04 MIL/uL (ref 4.22–5.81)
RDW: 14.6 % (ref 11.5–15.5)
WBC: 12.7 10*3/uL — ABNORMAL HIGH (ref 4.0–10.5)
nRBC: 0 % (ref 0.0–0.2)

## 2023-07-13 LAB — TROPONIN I (HIGH SENSITIVITY): Troponin I (High Sensitivity): 2 ng/L (ref <18)

## 2023-07-13 NOTE — Discharge Instructions (Signed)
 Workup here CT head CT neck and CT chest without any acute findings.  Discomfort in the chest is probably due to chest wall pain.  Also no evidence of any cardiac contusion.  Make an appointment follow-up with your doctor.  Return for any new or worse symptoms.  Recommend extra strength Tylenol  2 tablets every 8 hours.
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
## 2023-07-19 DIAGNOSIS — Z6827 Body mass index (BMI) 27.0-27.9, adult: Secondary | ICD-10-CM | POA: Diagnosis not present

## 2023-07-19 DIAGNOSIS — S46819A Strain of other muscles, fascia and tendons at shoulder and upper arm level, unspecified arm, initial encounter: Secondary | ICD-10-CM | POA: Diagnosis not present

## 2023-07-19 DIAGNOSIS — M940 Chondrocostal junction syndrome [Tietze]: Secondary | ICD-10-CM | POA: Diagnosis not present

## 2023-07-26 DIAGNOSIS — M940 Chondrocostal junction syndrome [Tietze]: Secondary | ICD-10-CM | POA: Diagnosis not present

## 2023-07-27 ENCOUNTER — Other Ambulatory Visit (HOSPITAL_COMMUNITY): Payer: Self-pay

## 2023-07-27 ENCOUNTER — Other Ambulatory Visit: Payer: Self-pay

## 2023-07-27 MED ORDER — TENOFOVIR DISOPROXIL FUMARATE 300 MG PO TABS: 300.0000 mg | ORAL_TABLET | Freq: Every day | ORAL | 1 refills | Status: DC

## 2023-07-28 ENCOUNTER — Telehealth: Payer: Self-pay | Admitting: Gastroenterology

## 2023-07-28 NOTE — Telephone Encounter (Signed)
Inbound call from [place] with [facility]. She is calling to find out if we received the PA for [facility] Requesting call back. [phone]; Rx# [id number] [facility] store [id number]

## 2023-08-01 ENCOUNTER — Other Ambulatory Visit (HOSPITAL_COMMUNITY): Payer: Self-pay

## 2023-08-03 NOTE — Telephone Encounter (Signed)
 [facility] called to follow up on prior authorization request. Please advise.

## 2023-08-04 ENCOUNTER — Other Ambulatory Visit (HOSPITAL_COMMUNITY): Payer: Self-pay

## 2023-08-04 NOTE — Telephone Encounter (Signed)
 Per test claim Viread was last filled 07/25/23 next fill date 08/17/23

## 2023-08-05 ENCOUNTER — Telehealth: Payer: Self-pay

## 2023-08-05 NOTE — Telephone Encounter (Signed)
-----   Message from Nurse Joselyn Glassman sent at 04/06/2023 10:24 AM EDT ----- Personal reminder sent 04/06/2023  Please set a clinical reminder for him to see me in clinic in 6 months. ( Dr. Myrtie Neither)  Reminder placed for 4 months  Pt needs to be scheduled for April

## 2023-08-05 NOTE — Telephone Encounter (Signed)
 Personal reminder received in Epic. Pt made aware. Pt was scheduled to see Dr. Myrtie Neither on 10/04/2023 at 11:00 AM.  Pt made aware. Pt verbalized understanding with all questions answered.

## 2023-08-11 DIAGNOSIS — M6281 Muscle weakness (generalized): Secondary | ICD-10-CM | POA: Diagnosis not present

## 2023-08-11 DIAGNOSIS — M799 Soft tissue disorder, unspecified: Secondary | ICD-10-CM | POA: Diagnosis not present

## 2023-08-11 DIAGNOSIS — M542 Cervicalgia: Secondary | ICD-10-CM | POA: Diagnosis not present

## 2023-08-11 DIAGNOSIS — R0782 Intercostal pain: Secondary | ICD-10-CM | POA: Diagnosis not present

## 2023-08-11 DIAGNOSIS — M2569 Stiffness of other specified joint, not elsewhere classified: Secondary | ICD-10-CM | POA: Diagnosis not present

## 2023-08-16 DIAGNOSIS — M2569 Stiffness of other specified joint, not elsewhere classified: Secondary | ICD-10-CM | POA: Diagnosis not present

## 2023-08-16 DIAGNOSIS — M6281 Muscle weakness (generalized): Secondary | ICD-10-CM | POA: Diagnosis not present

## 2023-08-16 DIAGNOSIS — R0782 Intercostal pain: Secondary | ICD-10-CM | POA: Diagnosis not present

## 2023-08-16 DIAGNOSIS — M542 Cervicalgia: Secondary | ICD-10-CM | POA: Diagnosis not present

## 2023-08-16 DIAGNOSIS — M799 Soft tissue disorder, unspecified: Secondary | ICD-10-CM | POA: Diagnosis not present

## 2023-08-18 DIAGNOSIS — M6281 Muscle weakness (generalized): Secondary | ICD-10-CM | POA: Diagnosis not present

## 2023-08-18 DIAGNOSIS — R0782 Intercostal pain: Secondary | ICD-10-CM | POA: Diagnosis not present

## 2023-08-18 DIAGNOSIS — M542 Cervicalgia: Secondary | ICD-10-CM | POA: Diagnosis not present

## 2023-08-18 DIAGNOSIS — M799 Soft tissue disorder, unspecified: Secondary | ICD-10-CM | POA: Diagnosis not present

## 2023-08-18 DIAGNOSIS — M2569 Stiffness of other specified joint, not elsewhere classified: Secondary | ICD-10-CM | POA: Diagnosis not present

## 2023-08-30 DIAGNOSIS — R0782 Intercostal pain: Secondary | ICD-10-CM | POA: Diagnosis not present

## 2023-08-30 DIAGNOSIS — M799 Soft tissue disorder, unspecified: Secondary | ICD-10-CM | POA: Diagnosis not present

## 2023-08-30 DIAGNOSIS — M542 Cervicalgia: Secondary | ICD-10-CM | POA: Diagnosis not present

## 2023-08-30 DIAGNOSIS — M6281 Muscle weakness (generalized): Secondary | ICD-10-CM | POA: Diagnosis not present

## 2023-08-30 DIAGNOSIS — M2569 Stiffness of other specified joint, not elsewhere classified: Secondary | ICD-10-CM | POA: Diagnosis not present

## 2023-09-01 DIAGNOSIS — M2569 Stiffness of other specified joint, not elsewhere classified: Secondary | ICD-10-CM | POA: Diagnosis not present

## 2023-09-01 DIAGNOSIS — M6281 Muscle weakness (generalized): Secondary | ICD-10-CM | POA: Diagnosis not present

## 2023-09-01 DIAGNOSIS — M799 Soft tissue disorder, unspecified: Secondary | ICD-10-CM | POA: Diagnosis not present

## 2023-09-01 DIAGNOSIS — R0782 Intercostal pain: Secondary | ICD-10-CM | POA: Diagnosis not present

## 2023-09-01 DIAGNOSIS — M542 Cervicalgia: Secondary | ICD-10-CM | POA: Diagnosis not present

## 2023-09-06 DIAGNOSIS — M2569 Stiffness of other specified joint, not elsewhere classified: Secondary | ICD-10-CM | POA: Diagnosis not present

## 2023-09-06 DIAGNOSIS — M542 Cervicalgia: Secondary | ICD-10-CM | POA: Diagnosis not present

## 2023-09-06 DIAGNOSIS — M799 Soft tissue disorder, unspecified: Secondary | ICD-10-CM | POA: Diagnosis not present

## 2023-09-06 DIAGNOSIS — R0782 Intercostal pain: Secondary | ICD-10-CM | POA: Diagnosis not present

## 2023-09-06 DIAGNOSIS — M6281 Muscle weakness (generalized): Secondary | ICD-10-CM | POA: Diagnosis not present

## 2023-09-13 DIAGNOSIS — M542 Cervicalgia: Secondary | ICD-10-CM | POA: Diagnosis not present

## 2023-09-13 DIAGNOSIS — M2569 Stiffness of other specified joint, not elsewhere classified: Secondary | ICD-10-CM | POA: Diagnosis not present

## 2023-09-13 DIAGNOSIS — R0782 Intercostal pain: Secondary | ICD-10-CM | POA: Diagnosis not present

## 2023-09-13 DIAGNOSIS — M6281 Muscle weakness (generalized): Secondary | ICD-10-CM | POA: Diagnosis not present

## 2023-09-13 DIAGNOSIS — M799 Soft tissue disorder, unspecified: Secondary | ICD-10-CM | POA: Diagnosis not present

## 2023-09-15 DIAGNOSIS — R0782 Intercostal pain: Secondary | ICD-10-CM | POA: Diagnosis not present

## 2023-09-15 DIAGNOSIS — M542 Cervicalgia: Secondary | ICD-10-CM | POA: Diagnosis not present

## 2023-09-15 DIAGNOSIS — M799 Soft tissue disorder, unspecified: Secondary | ICD-10-CM | POA: Diagnosis not present

## 2023-09-15 DIAGNOSIS — M2569 Stiffness of other specified joint, not elsewhere classified: Secondary | ICD-10-CM | POA: Diagnosis not present

## 2023-09-15 DIAGNOSIS — M6281 Muscle weakness (generalized): Secondary | ICD-10-CM | POA: Diagnosis not present

## 2023-09-26 ENCOUNTER — Encounter: Payer: Self-pay | Admitting: Registered Nurse

## 2023-09-26 ENCOUNTER — Encounter: Payer: Medicare HMO | Attending: Registered Nurse | Admitting: Registered Nurse

## 2023-09-26 VITALS — BP 134/88 | HR 66 | Ht 69.0 in | Wt 187.0 lb

## 2023-09-26 DIAGNOSIS — G894 Chronic pain syndrome: Secondary | ICD-10-CM | POA: Diagnosis not present

## 2023-09-26 DIAGNOSIS — M48061 Spinal stenosis, lumbar region without neurogenic claudication: Secondary | ICD-10-CM | POA: Diagnosis not present

## 2023-09-26 DIAGNOSIS — M5416 Radiculopathy, lumbar region: Secondary | ICD-10-CM | POA: Insufficient documentation

## 2023-09-26 DIAGNOSIS — Z79891 Long term (current) use of opiate analgesic: Secondary | ICD-10-CM | POA: Diagnosis not present

## 2023-09-26 DIAGNOSIS — Z5181 Encounter for therapeutic drug level monitoring: Secondary | ICD-10-CM | POA: Diagnosis not present

## 2023-09-26 MED ORDER — TRAMADOL HCL 50 MG PO TABS: ORAL_TABLET | ORAL | 5 refills | Status: DC

## 2023-09-26 NOTE — Progress Notes (Unsigned)
 Subjective:    Patient ID: [name], male    DOB: [birth year], 68 y.o.   MRN: [id number]  HPI: [name] is a 68 y.o. male who returns for follow up appointment for chronic pain and medication refill. states *** pain is located in  ***. rates pain ***. current exercise regime is walking and performing stretching exercises.  Mr. Haubner Morphine equivalent is *** MME.   UDS ordered today.      Pain Inventory Average Pain 6 Pain Right Now 5 My pain is burning  In the last 24 hours, has pain interfered with the following? General activity 6 Relation with others 6 Enjoyment of life 9 What TIME of day is your pain at its worst? daytime and evening Sleep (in general) Fair  Pain is worse with: walking, bending, standing, and some activites Pain improves with: rest Relief from Meds: 9  Family History  Problem Relation Age of Onset   Liver cancer Mother    Diabetes Father    Heart attack Sister 31   Liver cancer Brother    Heart attack Brother 80   Colon cancer Neg Hx    Social History   Socioeconomic History   Marital status: Married    Spouse name: Not on file   Number of children: 3   Years of education: Not on file   Highest education level: Not on file  Occupational History   Occupation: Unemployed  Tobacco Use   Smoking status: Some Days    Current packs/day: 0.50    Types: Cigarettes   Smokeless tobacco: Never  Vaping Use   Vaping status: Never Used  Substance and Sexual Activity   Alcohol use: No    Alcohol/week: 0.0 standard drinks of alcohol   Drug use: No   Sexual activity: Not Currently  Other Topics Concern   Not on file  Social History Narrative   Some caffeine use    Social Drivers of Corporate investment banker Strain: Not on file  Food Insecurity: Not on file  Transportation Needs: Not on file  Physical Activity: Not on file  Stress: Not on file  Social Connections: Not on file   Past Surgical History:  Procedure  Laterality Date   HERNIA REPAIR     laparoscopic ventral    SPLENECTOMY, TOTAL     Past Surgical History:  Procedure Laterality Date   HERNIA REPAIR     laparoscopic ventral    SPLENECTOMY, TOTAL     Past Medical History:  Diagnosis Date   Cervical radiculopathy    Cirrhosis (HCC)    GERD (gastroesophageal reflux disease)    Hairy cell leukemia (HCC) 2005   Last chemo 2006   Hepatitis B infection    Incisional hernia    Lumbar radiculopathy    Memory impairment    Myopathy    Vitamin D deficiency    BP (!) 155/94   Pulse 66   Ht 5\' 9"  (1.753 m)   Wt 187 lb (84.8 kg)   SpO2 99%   BMI 27.62 kg/m   Opioid Risk Score:   Fall Risk Score:  `1  Depression screen [facility] 2/9     09/26/2023   10:17 AM 03/14/2023   10:12 AM 09/07/2022    9:44 AM 03/24/2021   10:17 AM 04/08/2016   10:17 AM 10/06/2015    2:10 PM  Depression screen PHQ 2/9  Decreased Interest 0 0 0 0 3 3  Down, Depressed, Hopeless 0  0 0 0 0 0  PHQ - 2 Score 0 0 0 0 3 3  Altered sleeping      0  Tired, decreased energy      1  Change in appetite      1  Feeling bad or failure about yourself       1  Trouble concentrating      1  Moving slowly or fidgety/restless      0  Suicidal thoughts      0  PHQ-9 Score      7    Review of Systems  Musculoskeletal:        Right side, arm and leg  All other systems reviewed and are negative.      Objective:   Physical Exam        Assessment & Plan:  1. Lumbar Degenerative Disc with Lumbar Radiculitis: Continue HEP as Tolerated. 03/14/2023 Refilled:Tramadol  50 mg take two tablets twice a day as needed for pain #120.  We will continue the opioid monitoring program, this consists of regular clinic visits, examinations, urine drug screen, pill counts as well as use of [place]  Controlled Substance Reporting system. A 12 month History has been reviewed on the [place]  Controlled Substance Reporting System on 03/14/2023. 2. Chronic Right Shoulder Pain: No  complaints today. Continue HEP as Tolerated. Continue to Monitor. 03/14/2023   Return in 6 months

## 2023-09-28 LAB — DRUG TOX MONITOR 1 W/CONF, ORAL FLD
Amphetamines: NEGATIVE ng/mL (ref <10)
Barbiturates: NEGATIVE ng/mL (ref <10)
Benzodiazepines: NEGATIVE ng/mL (ref <0.50)
Buprenorphine: NEGATIVE ng/mL (ref <0.10)
Cocaine: NEGATIVE ng/mL (ref <5.0)
Cotinine: 188.9 ng/mL — ABNORMAL HIGH (ref <5.0)
Fentanyl: NEGATIVE ng/mL (ref <0.10)
Heroin Metabolite: NEGATIVE ng/mL (ref <1.0)
MARIJUANA: NEGATIVE ng/mL (ref <2.5)
MDMA: NEGATIVE ng/mL (ref <10)
Meprobamate: NEGATIVE ng/mL (ref <2.5)
Methadone: NEGATIVE ng/mL (ref <5.0)
Nicotine Metabolite: POSITIVE ng/mL — AB (ref <5.0)
Opiates: NEGATIVE ng/mL (ref <2.5)
Phencyclidine: NEGATIVE ng/mL (ref <10)
Tapentadol: NEGATIVE ng/mL (ref <5.0)
Tramadol: >500 ng/mL — ABNORMAL HIGH (ref <5.0)
Tramadol: POSITIVE ng/mL — AB (ref <5.0)
Zolpidem: NEGATIVE ng/mL (ref <5.0)

## 2023-09-28 LAB — DRUG TOX ALC METAB W/CON, ORAL FLD: Alcohol Metabolite: NEGATIVE ng/mL (ref <25)

## 2023-10-04 ENCOUNTER — Encounter: Payer: Self-pay | Admitting: Gastroenterology

## 2023-10-04 ENCOUNTER — Other Ambulatory Visit (INDEPENDENT_AMBULATORY_CARE_PROVIDER_SITE_OTHER)

## 2023-10-04 ENCOUNTER — Ambulatory Visit (INDEPENDENT_AMBULATORY_CARE_PROVIDER_SITE_OTHER): Payer: Medicare HMO | Admitting: Gastroenterology

## 2023-10-04 VITALS — BP 118/78 | HR 72 | Ht 69.0 in | Wt 185.0 lb

## 2023-10-04 DIAGNOSIS — Z800 Family history of malignant neoplasm of digestive organs: Secondary | ICD-10-CM

## 2023-10-04 DIAGNOSIS — B1910 Unspecified viral hepatitis B without hepatic coma: Secondary | ICD-10-CM

## 2023-10-04 DIAGNOSIS — K7460 Unspecified cirrhosis of liver: Secondary | ICD-10-CM

## 2023-10-04 DIAGNOSIS — B181 Chronic viral hepatitis B without delta-agent: Secondary | ICD-10-CM | POA: Diagnosis not present

## 2023-10-04 LAB — HEPATIC FUNCTION PANEL
ALT: 20 U/L (ref 0–53)
AST: 21 U/L (ref 0–37)
Albumin: 4.6 g/dL (ref 3.5–5.2)
Alkaline Phosphatase: 112 U/L (ref 39–117)
Bilirubin, Direct: 0.1 mg/dL (ref 0.0–0.3)
Total Bilirubin: 0.5 mg/dL (ref 0.2–1.2)
Total Protein: 7.9 g/dL (ref 6.0–8.3)

## 2023-10-04 LAB — PROTIME-INR
INR: 1.1 ratio — ABNORMAL HIGH (ref 0.8–1.0)
Prothrombin Time: 12 s (ref 9.6–13.1)

## 2023-10-04 NOTE — Progress Notes (Signed)
 [name]  Chief Complaint: Hepatitis B virus infection with cirrhosis  Subjective  Prior history  Chronic hepatitis B virus infection(eAg neg, eAb pos) and cirrhosis, last seen here October 2020 for.  He had previously declined screening colonoscopy but was agreeable to a Cologuard in December 2023.  He had a positive result but then declined a colonoscopy.  He was given some time to consider it further, contacted again 3 months later and again declined a colonoscopy.  He is also previously declined upper endoscopy for variceal screening.  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 Viread  for his hepatitis B.  Negative viral load May 2023.  No liver masses seen on ultrasound May and June 2023, though somewhat limited acoustic windows.  MRI liver planned at last visit (has IV CT dye allergy), ordered but he ultimately decided not to do it. Family history of liver cancer [name] agreed to right upper quadrant ultrasound October 2024-report on file, cirrhosis, no liver mass seen. Years of undetected HBV DNA on treatment, July 2022, May 2023, December 2023, October 2024    History of Present Illness  [name] has been doing well since I last saw him.  He denies abdominal pain, jaundice, nausea vomiting or other digestive symptoms. He is able to get his medicine from the pharmacy regularly and takes it daily.  He asked if there were any newer treatments available that might be able to cure the infection.  I explained the nature of hepatitis B virus, particularly its incorporation into the hepatocellular DNA and thus inability to curative antiviral therapy.  He needs to remain on indefinite suppressive therapy.  On a personal [name], he has been happy for friends and family living in [place] since the fall of the dictatorship several months ago. ROS: Cardiovascular:  no chest pain Respiratory: no dyspnea  The patient's Past Medical, Family and Social History were  reviewed and are on file in the EMR. Past Medical History:  Diagnosis Date   Cervical radiculopathy    Cirrhosis (HCC)    GERD (gastroesophageal reflux disease)    Hairy cell leukemia (HCC) 2005   Last chemo 2006   Hepatitis B infection    Incisional hernia    Lumbar radiculopathy    Memory impairment    Myopathy    Vitamin D deficiency     Past Surgical History:  Procedure Laterality Date   HERNIA REPAIR     laparoscopic ventral    SPLENECTOMY, TOTAL       Objective:  Med list reviewed  Current Outpatient Medications:    Cyanocobalamin  (VITAMIN B12) 1000 MCG TBCR, 1 tablet Orally Once a day for 30 day(s), Disp: , Rfl:    tenofovir  (VIREAD ) 300 MG tablet, Take 1 tablet (300 mg total) by mouth daily., Disp: 30 tablet, Rfl: 1   traMADol  (ULTRAM ) 50 MG tablet, TAKE 2 TABLETS(100 MG) BY MOUTH TWICE DAILY, Disp: 120 tablet, Rfl: 5   Vital signs in last 24 hrs: Vitals:   10/04/23 1058  BP: 118/78  Pulse: 72   Wt Readings from Last 3 Encounters:  10/04/23 185 lb (83.9 kg)  09/26/23 187 lb (84.8 kg)  07/13/23 188 lb 0.8 oz (85.3 kg)    Physical Exam  Well-appearing HEENT: sclera anicteric, oral mucosa moist without lesions Neck: supple, no thyromegaly, JVD or lymphadenopathy Cardiac: Regular without appreciable murmur,  no peripheral edema Pulm: clear to auscultation bilaterally, normal RR and effort noted Abdomen: soft, no tenderness, with  active bowel sounds. No guarding or palpable hepatosplenomegaly. Skin; warm and dry, no jaundice or rash   Labs: Negative HBV DNA October 2024  Last AFP 2.13 March 2023     Latest Ref Rng & Units 07/13/2023    1:10 PM 03/30/2023    3:41 PM 10/26/2022   12:57 PM  CBC  WBC 4.0 - 10.5 K/uL 12.7  8.6  8.6   Hemoglobin 13.0 - 17.0 g/dL 40.9  81.1  91.4   Hematocrit 39.0 - 52.0 % 50.0  48.4  44.2   Platelets 150 - 400 K/uL 313  331.0  339       Latest Ref Rng & Units 07/13/2023    1:10 PM 03/30/2023    3:41 PM 10/26/2022    12:57 PM  CMP  Glucose 70 - 99 mg/dL 95  91  782   BUN 8 - 23 mg/dL 15  19  19    Creatinine 0.61 - 1.24 mg/dL 9.56  2.13  0.86   Sodium 135 - 145 mmol/L 140  137  139   Potassium 3.5 - 5.1 mmol/L 4.0  3.9  3.9   Chloride 98 - 111 mmol/L 108  106  110   CO2 22 - 32 mmol/L 21  25  23    Calcium 8.9 - 10.3 mg/dL 9.4  9.3  9.4   Total Protein 6.0 - 8.3 g/dL  7.7  7.4   Total Bilirubin 0.2 - 1.2 mg/dL  0.3  0.4   Alkaline Phos 39 - 117 U/L  106  119   AST 0 - 37 U/L  22  21   ALT 0 - 53 U/L  20  19    Lab Results  Component Value Date   INR 1.1 (H) 03/30/2023   INR 1.1 (H) 05/13/2022   INR 1.0 10/30/2021     ___________________________________________ Radiologic studies: CLINICAL DATA:  HCC screening   EXAM: ULTRASOUND ABDOMEN LIMITED RIGHT UPPER QUADRANT   COMPARISON:  Ultrasound abdomen 01/21/2023   FINDINGS: Gallbladder:   No gallstones or wall thickening visualized. No sonographic Murphy sign noted by sonographer.   Common bile duct:   Diameter: 3.9 mm   Liver:   Coarsened echogenicity and nodular contour. No focal lesion. Portal vein is patent on color Doppler imaging with normal direction of blood flow towards the liver.   Other: Incidental [name] of a 5.4 cm renal cyst right kidney. No imaging follow-up needed.   IMPRESSION: 1. Cirrhotic morphology of the liver. No focal lesion. 2. No cholelithiasis or sonographic evidence for acute cholecystitis.     Electronically Signed   By: Jone Neither M.D.   On: 04/05/2023 14:04    ____________________________________________ Other:   _____________________________________________   Encounter Diagnoses  Name Primary?   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 Cirrhosis with family history of liver cancer Positive Cologuard as noted above-still does not wish to have a colonoscopy.   Plan: Labs today: Hepatic function panel, INR, AFP Schedule right upper quadrant ultrasound for HCC screening  See me in 6 months or sooner if needed.  At that point in addition to his other labs he will need a hepatitis B viral  load    [name]

## 2023-10-04 NOTE — Patient Instructions (Signed)
 Your provider has requested that you go to the basement level for lab work before leaving today. Press "B" on the elevator. The lab is located at the first door on the left as you exit the elevator.  Due to recent changes in healthcare laws, you may see the results of your imaging and laboratory studies on MyChart before your provider has had a chance to review them.  We understand that in some cases there may be results that are confusing or concerning to you. Not all laboratory results come back in the same time frame and the provider may be waiting for multiple results in order to interpret others.  Please give us  48 hours in order for your provider to thoroughly review all the results before contacting the office for clarification of your results.   I am faxing your ultrasound order to [facility] at [address] [facility]. They will contact you about setting up your ultrasound. If you do not hear in a timely fashion call them at [phone].  I appreciate the opportunity to care for you. Lorella Roles, MD

## 2023-10-06 LAB — AFP TUMOR MARKER: AFP-Tumor Marker: 2.8 ng/mL (ref <6.1)

## 2023-10-21 ENCOUNTER — Other Ambulatory Visit: Payer: Self-pay

## 2023-10-21 DIAGNOSIS — C9141 Hairy cell leukemia, in remission: Secondary | ICD-10-CM

## 2023-10-23 NOTE — Progress Notes (Signed)
 HEMATOLOGY/ONCOLOGY CLINIC NOTE  Date of Service: 10/24/2023   Patient Care Team: Mordechai April, DO as PCP - General (Family Medicine)  CHIEF COMPLAINTS/PURPOSE OF CONSULTATION:  Follow-up for Hairy cell leukemia  HISTORY OF PRESENTING ILLNESS:   [name] is a wonderful 68 y.o. male who has been referred to us  by Dr. Margery Sheets for evaluation and management of hx of hairy cell leukemia. The pt reports that he is doing well overall.  The pt reports that he saw Dr. Meredeth Stallion here in 2005 and was diagnosed with hairy cell leukemia. The pt has been in remission since 2006.  The pt notes that he was able to tolerate the Cladribine inpatient chemotherapy treatment as well as possible. The pt is not sure why he was referred here and that his Hgb was elevated is potentially why. The pt notes he was referred by his PCP Dr. Margery Sheets at [facility]. The pt notes that he is still a current everyday smoker at around one pack each day or just under this. The pt notes that he was diagnosed with Hepatitis B in 2005. He notes that to his knowledge he was not diagnosed until after he had started treatment and needing blood transfusions. The pt denies any history of blood transfusions in the past prior to this. The pt currently sees his doctor and gets an ultrasound regarding his liver cirrhosis every six months. This has been very stable. The pt notes that he was not told why his B12 was low. The pt notes he is unable to follow Ramadan as he has to eat and drink something due to his medical exceptions. The pt notes that he does not eat much meat, denying any red meat. The pt notes he has been on B12 replacement for the last two months. According to the pt, he does not feel any different now than he did six months or one year ago. The pt notes that he currently does not work and notes no allergies. The pt notes his mother passed away from liver cancer, denying her having Hepatitis. The pt notes his brother also passed away  from liver cancer, not sure if he had Hepatitis or not. The pt notes that they were living back home in [place] and not receiving very good medical care.  The pt notes no other questions or concerns at this time. The pt denies any exposure to chemicals. The pt used to work in [employer] and notes no specific radiation exposure. The pt notes he currently sees Dr. Veronda Goody for his Viread  management, but he missed two appointments and is no longer allowed to go there. The pt is currently looking for another a doctor to manage his Viread .  Lab results 06/03/2020 of CBC w/diff and CMP is as follows: all values are WNL except for Hgb of 17.2, MCV of 98.4, Mch of 33.4, Lymph Abs of 3.40K. 06/03/2020 Vitamin B12 of 142.  On review of systems, pt denies fevers, chills, night sweats, sudden weight loss, decreased appetite, leg swelling, and any other symptoms.  INTERVAL HISTORY:  [name] is a 68 y.o. male here for evaluation of hairy cell leukemia. Patient was last seen by me on 10/26/2022 and complained of occasional feeling of passing out in the mornings prior to eating. Patient notes no acute new symptoms since his last clinic visit. No fevers/chills/night sweats or weight loss. He notes he has been very happy that the oppressive regimen of [place] in [place] was [employer] and  that he can safely visit family in [place] now.    MEDICAL HISTORY:  Past Medical History:  Diagnosis Date   Cervical radiculopathy    Cirrhosis (HCC)    GERD (gastroesophageal reflux disease)    Hairy cell leukemia (HCC) 2005   Last chemo 2006   Hepatitis B infection    Incisional hernia    Lumbar radiculopathy    Memory impairment    Myopathy    Vitamin D deficiency     SURGICAL HISTORY: Past Surgical History:  Procedure Laterality Date   HERNIA REPAIR     laparoscopic ventral    SPLENECTOMY, TOTAL      SOCIAL HISTORY: Social History   Socioeconomic History   Marital status: Married    Spouse  name: Not on file   Number of children: 3   Years of education: Not on file   Highest education level: Not on file  Occupational History   Occupation: Unemployed  Tobacco Use   Smoking status: Some Days    Current packs/day: 0.50    Types: Cigarettes   Smokeless tobacco: Never  Vaping Use   Vaping status: Never Used  Substance and Sexual Activity   Alcohol use: No    Alcohol/week: 0.0 standard drinks of alcohol   Drug use: No   Sexual activity: Not Currently  Other Topics Concern   Not on file  Social History Narrative   Some caffeine use    Social Drivers of Corporate investment banker Strain: Not on file  Food Insecurity: Not on file  Transportation Needs: Not on file  Physical Activity: Not on file  Stress: Not on file  Social Connections: Not on file  Intimate Partner Violence: Not on file    FAMILY HISTORY: Family History  Problem Relation Age of Onset   Liver cancer Mother    Diabetes Father    Heart attack Sister 65   Liver cancer Brother    Heart attack Brother 60   Colon cancer Neg Hx     ALLERGIES:  is allergic to metrizamide and iodinated contrast media.  MEDICATIONS:  Current Outpatient Medications  Medication Sig Dispense Refill   Cyanocobalamin  (VITAMIN B12) 1000 MCG TBCR 1 tablet Orally Once a day for 30 day(s)     tenofovir  (VIREAD ) 300 MG tablet Take 1 tablet (300 mg total) by mouth daily. 30 tablet 1   traMADol  (ULTRAM ) 50 MG tablet TAKE 2 TABLETS(100 MG) BY MOUTH TWICE DAILY 120 tablet 5   No current facility-administered medications for this visit.    REVIEW OF SYSTEMS:    10 Point review of Systems was done is negative except as noted above.  PHYSICAL EXAMINATION: ECOG PERFORMANCE STATUS: 0 - Asymptomatic  . Vitals:   10/24/23 1206  BP: 128/86  Pulse: 78  Resp: 20  Temp: (!) 97.3 F (36.3 C)  SpO2: 99%     Filed Weights   10/24/23 1206  Weight: 187 lb 3.2 oz (84.9 kg)     .Body mass index is 27.64  kg/m.   GENERAL:alert, in no acute distress and comfortable SKIN: no acute rashes, no significant lesions EYES: conjunctiva are pink and non-injected, sclera anicteric OROPHARYNX: MMM, no exudates, no oropharyngeal erythema or ulceration NECK: supple, no JVD LYMPH:  no palpable lymphadenopathy in the cervical, axillary or inguinal regions LUNGS: clear to auscultation b/l with normal respiratory effort HEART: regular rate & rhythm ABDOMEN:  normoactive bowel sounds , non tender, not distended. Extremity: no pedal edema PSYCH: alert &  oriented x 3 with fluent speech NEURO: no focal motor/sensory deficits   LABORATORY DATA:  I have reviewed the data as listed  .    Latest Ref Rng & Units 10/24/2023   11:43 AM 07/13/2023    1:10 PM 03/30/2023    3:41 PM  CBC  WBC 4.0 - 10.5 K/uL 8.3  12.7  8.6   Hemoglobin 13.0 - 17.0 g/dL 40.9  81.1  91.4   Hematocrit 39.0 - 52.0 % 46.3  50.0  48.4   Platelets 150 - 400 K/uL 328  313  331.0    . CBC    Component Value Date/Time   WBC 8.3 10/24/2023 1143   WBC 12.7 (H) 07/13/2023 1310   RBC 4.85 10/24/2023 1143   HGB 16.1 10/24/2023 1143   HGB 16.1 04/09/2009 1024   HGB 14.8 09/16/2005 0822   HCT 46.3 10/24/2023 1143   HCT 47.9 04/09/2009 1024   HCT 43.4 09/16/2005 0822   PLT 328 10/24/2023 1143   PLT 265 04/09/2009 1024   PLT 330 09/16/2005 0822   MCV 95.5 10/24/2023 1143   MCV 99 (H) 04/09/2009 1024   MCV 94.8 09/16/2005 0822   MCH 33.2 10/24/2023 1143   MCHC 34.8 10/24/2023 1143   RDW 14.6 10/24/2023 1143   RDW 11.7 04/09/2009 1024   RDW 15.7 (H) 09/16/2005 0822   LYMPHSABS 2.9 10/24/2023 1143   LYMPHSABS 2.6 04/09/2009 1024   LYMPHSABS 1.9 09/16/2005 0822   MONOABS 0.5 10/24/2023 1143   MONOABS 0.9 09/16/2005 0822   EOSABS 0.1 10/24/2023 1143   EOSABS 0.2 04/09/2009 1024   BASOSABS 0.0 10/24/2023 1143   BASOSABS 0.1 04/09/2009 1024   BASOSABS 0.0 09/16/2005 0822    .    Latest Ref Rng & Units 10/04/2023   12:08 PM  07/13/2023    1:10 PM 03/30/2023    3:41 PM  CMP  Glucose 70 - 99 mg/dL  95  91   BUN 8 - 23 mg/dL  15  19   Creatinine 7.82 - 1.24 mg/dL  9.56  2.13   Sodium 086 - 145 mmol/L  140  137   Potassium 3.5 - 5.1 mmol/L  4.0  3.9   Chloride 98 - 111 mmol/L  108  106   CO2 22 - 32 mmol/L  21  25   Calcium 8.9 - 10.3 mg/dL  9.4  9.3   Total Protein 6.0 - 8.3 g/dL 7.9   7.7   Total Bilirubin 0.2 - 1.2 mg/dL 0.5   0.3   Alkaline Phos 39 - 117 U/L 112   106   AST 0 - 37 U/L 21   22   ALT 0 - 53 U/L 20   20    . Lab Results  Component Value Date   LDH 124 10/26/2022    10/07/2020 JAK2   10/07/2020 Flow Cytometry - No monoclonal B-cell population or abnormal T-cell phenotype  identified.    RADIOGRAPHIC STUDIES: I have personally reviewed the radiological images as listed and agreed with the findings in the report. No results found.  ASSESSMENT & PLAN:   68 yo male with   1) H/o Hairy cell leukemia 2) B12 deficiency 3) h/o Hepatitis B  PLAN:  -discussed lab results from today, 10/24/2023, in detail with patient -CBC wnl CMP stable LDH wnl Patient has no lab or clinic evidence of Hairy cell leukemia recurrence/progression at this time. Counseling on smoking cessation F/u with PCP for age appropriate vaccinations  and cancer screening  FOLLOW-UP: RTC with Dr Salomon Cree with labs in 12 months  The total time spent in the appointment was 20 minutes* .  All of the patient's questions were answered with apparent satisfaction. The patient knows to call the clinic with any problems, questions or concerns.   Jacquelyn Matt MD MS AAHIVMS [facility] Hematology/Oncology Physician [facility]  .*Total Encounter Time as defined by the Centers for Medicare and Medicaid Services includes, in addition to the face-to-face time of a patient visit (documented in the note above) non-face-to-face time: obtaining and reviewing outside history, ordering and reviewing medications, tests or  procedures, care coordination (communications with other health care professionals or caregivers) and documentation in the medical record.    I,Mitra Faeizi,acting as a Neurosurgeon for Jacquelyn Matt, MD.,have documented all relevant documentation on the behalf of Jacquelyn Matt, MD,as directed by  Jacquelyn Matt, MD while in the presence of Jacquelyn Matt, MD.  .I have reviewed the above documentation for accuracy and completeness, and I agree with the above. .Princessa Lesmeister Kishore Ronson Hagins MD

## 2023-10-24 ENCOUNTER — Inpatient Hospital Stay (HOSPITAL_BASED_OUTPATIENT_CLINIC_OR_DEPARTMENT_OTHER): Payer: Medicare HMO | Admitting: Hematology

## 2023-10-24 ENCOUNTER — Inpatient Hospital Stay: Payer: Medicare HMO | Attending: Hematology

## 2023-10-24 VITALS — BP 128/86 | HR 78 | Temp 97.3°F | Resp 20 | Wt 187.2 lb

## 2023-10-24 DIAGNOSIS — Z800 Family history of malignant neoplasm of digestive organs: Secondary | ICD-10-CM | POA: Diagnosis not present

## 2023-10-24 DIAGNOSIS — F17210 Nicotine dependence, cigarettes, uncomplicated: Secondary | ICD-10-CM | POA: Diagnosis not present

## 2023-10-24 DIAGNOSIS — C9141 Hairy cell leukemia, in remission: Secondary | ICD-10-CM | POA: Insufficient documentation

## 2023-10-24 DIAGNOSIS — E538 Deficiency of other specified B group vitamins: Secondary | ICD-10-CM | POA: Diagnosis not present

## 2023-10-24 DIAGNOSIS — Z8619 Personal history of other infectious and parasitic diseases: Secondary | ICD-10-CM | POA: Insufficient documentation

## 2023-10-24 LAB — CMP (CANCER CENTER ONLY)
ALT: 19 U/L (ref 0–44)
AST: 18 U/L (ref 15–41)
Albumin: 4.7 g/dL (ref 3.5–5.0)
Alkaline Phosphatase: 122 U/L (ref 38–126)
Anion gap: 4 — ABNORMAL LOW (ref 5–15)
BUN: 19 mg/dL (ref 8–23)
CO2: 29 mmol/L (ref 22–32)
Calcium: 9.5 mg/dL (ref 8.9–10.3)
Chloride: 107 mmol/L (ref 98–111)
Creatinine: 0.87 mg/dL (ref 0.61–1.24)
GFR, Estimated: >60 mL/min (ref >60)
Glucose, Bld: 99 mg/dL (ref 70–99)
Potassium: 3.8 mmol/L (ref 3.5–5.1)
Sodium: 140 mmol/L (ref 135–145)
Total Bilirubin: 0.3 mg/dL (ref 0.0–1.2)
Total Protein: 8 g/dL (ref 6.5–8.1)

## 2023-10-24 LAB — CBC WITH DIFFERENTIAL (CANCER CENTER ONLY)
Abs Immature Granulocytes: 0.02 10*3/uL (ref 0.00–0.07)
Basophils Absolute: 0 10*3/uL (ref 0.0–0.1)
Basophils Relative: 0 %
Eosinophils Absolute: 0.1 10*3/uL (ref 0.0–0.5)
Eosinophils Relative: 1 %
HCT: 46.3 % (ref 39.0–52.0)
Hemoglobin: 16.1 g/dL (ref 13.0–17.0)
Immature Granulocytes: 0 %
Lymphocytes Relative: 35 %
Lymphs Abs: 2.9 10*3/uL (ref 0.7–4.0)
MCH: 33.2 pg (ref 26.0–34.0)
MCHC: 34.8 g/dL (ref 30.0–36.0)
MCV: 95.5 fL (ref 80.0–100.0)
Monocytes Absolute: 0.5 10*3/uL (ref 0.1–1.0)
Monocytes Relative: 6 %
Neutro Abs: 4.8 10*3/uL (ref 1.7–7.7)
Neutrophils Relative %: 58 %
Platelet Count: 328 10*3/uL (ref 150–400)
RBC: 4.85 MIL/uL (ref 4.22–5.81)
RDW: 14.6 % (ref 11.5–15.5)
WBC Count: 8.3 10*3/uL (ref 4.0–10.5)
nRBC: 0 % (ref 0.0–0.2)

## 2023-10-24 LAB — LACTATE DEHYDROGENASE: LDH: 128 U/L (ref 98–192)

## 2023-11-01 ENCOUNTER — Telehealth: Payer: Self-pay

## 2023-11-01 NOTE — Telephone Encounter (Signed)
-----   Message from [facility] J sent at 10/07/2023 10:25 AM EDT ----- Do we have the [facility] US  report done 10/20/2023 at the [facility] location, phone # [phone] for Dr Lorella Roles??

## 2023-11-01 NOTE — Telephone Encounter (Signed)
 I called [facility] and patient has r/s'ed his U/S for 11/04/2023. I spoke with Roen and gave him the fax # [phone] for them to send the results to. Their recording says it may take 10-12 days to get reports.

## 2023-11-04 DIAGNOSIS — B1910 Unspecified viral hepatitis B without hepatic coma: Secondary | ICD-10-CM | POA: Diagnosis not present

## 2023-11-04 DIAGNOSIS — K7460 Unspecified cirrhosis of liver: Secondary | ICD-10-CM | POA: Diagnosis not present

## 2023-11-04 DIAGNOSIS — K7689 Other specified diseases of liver: Secondary | ICD-10-CM | POA: Diagnosis not present

## 2023-11-04 DIAGNOSIS — B181 Chronic viral hepatitis B without delta-agent: Secondary | ICD-10-CM | POA: Diagnosis not present

## 2023-11-10 ENCOUNTER — Telehealth: Payer: Self-pay | Admitting: Gastroenterology

## 2023-11-10 NOTE — Telephone Encounter (Signed)
 I recently saw this patient who has chronic hepatitis B virus with cirrhosis, and he had a right upper quadrant ultrasound done for liver cancer screening.  This scan was done in the [facility],  the report was faxed to me and I just reviewed it in my inbox of results.   There is a small cyst in the liver, which is common and not concerning.  No reported mass or other finding that would be concerning for liver cancer. Good normal blood flow through the liver.  This patient is not on MyChart, so please contact him with the report. (Faxed report will be sent for scanning to chart)  Memory Staggers MD

## 2023-11-10 NOTE — Telephone Encounter (Signed)
 Pt informed of results and states understanding. Pt has no questions at this time.  Electronically signed:  Londell River, NCMA

## 2023-11-18 DIAGNOSIS — J439 Emphysema, unspecified: Secondary | ICD-10-CM | POA: Diagnosis not present

## 2023-11-18 DIAGNOSIS — Z0000 Encounter for general adult medical examination without abnormal findings: Secondary | ICD-10-CM | POA: Diagnosis not present

## 2023-11-18 DIAGNOSIS — C9141 Hairy cell leukemia, in remission: Secondary | ICD-10-CM | POA: Diagnosis not present

## 2023-11-18 DIAGNOSIS — E538 Deficiency of other specified B group vitamins: Secondary | ICD-10-CM | POA: Diagnosis not present

## 2023-11-18 DIAGNOSIS — K7469 Other cirrhosis of liver: Secondary | ICD-10-CM | POA: Diagnosis not present

## 2023-11-18 DIAGNOSIS — E785 Hyperlipidemia, unspecified: Secondary | ICD-10-CM | POA: Diagnosis not present

## 2023-11-18 DIAGNOSIS — B181 Chronic viral hepatitis B without delta-agent: Secondary | ICD-10-CM | POA: Diagnosis not present

## 2023-11-18 DIAGNOSIS — E559 Vitamin D deficiency, unspecified: Secondary | ICD-10-CM | POA: Diagnosis not present

## 2023-11-18 DIAGNOSIS — Z125 Encounter for screening for malignant neoplasm of prostate: Secondary | ICD-10-CM | POA: Diagnosis not present

## 2023-11-21 NOTE — Telephone Encounter (Signed)
 Report came and patient has been informed of results.

## 2023-12-20 ENCOUNTER — Other Ambulatory Visit: Payer: Self-pay

## 2023-12-20 MED ORDER — TENOFOVIR DISOPROXIL FUMARATE 300 MG PO TABS: 300.0000 mg | ORAL_TABLET | Freq: Every day | ORAL | 2 refills | Status: DC

## 2024-03-19 ENCOUNTER — Other Ambulatory Visit: Payer: Self-pay

## 2024-03-19 MED ORDER — TENOFOVIR DISOPROXIL FUMARATE 300 MG PO TABS: 300.0000 mg | ORAL_TABLET | Freq: Every day | ORAL | 1 refills | Status: DC

## 2024-03-19 NOTE — Progress Notes (Unsigned)
 Subjective:    Patient ID: [name], male    DOB: 11-13-1955, 68 y.o.   MRN: [id number]  HPI: [name] is a 68 y.o. male who returns for follow up appointment for chronic pain and medication refill. He states his pain is located in his lower back mainly right side. He rates his pain 4 . His current exercise regime is walking and performing stretching exercises.  Mr. Alvidrez Morphine equivalent is 40.00 MME.   Oral Swab was Performed today.    Pain Inventory Average Pain 6 Pain Right Now 4 My pain is burning  In the last 24 hours, has pain interfered with the following? General activity 5 Relation with others 5 Enjoyment of life 7 What TIME of day is your pain at its worst? evening and night Sleep (in general) Fair  Pain is worse with: walking Pain improves with: rest Relief from Meds: 5  Family History  Problem Relation Age of Onset   Liver cancer Mother    Diabetes Father    Heart attack Sister 85   Liver cancer Brother    Heart attack Brother 55   Colon cancer Neg Hx    Social History   Socioeconomic History   Marital status: Married    Spouse name: Not on file   Number of children: 3   Years of education: Not on file   Highest education level: Not on file  Occupational History   Occupation: Unemployed  Tobacco Use   Smoking status: Some Days    Current packs/day: 0.50    Types: Cigarettes   Smokeless tobacco: Never  Vaping Use   Vaping status: Never Used  Substance and Sexual Activity   Alcohol use: No    Alcohol/week: 0.0 standard drinks of alcohol   Drug use: No   Sexual activity: Not Currently  Other Topics Concern   Not on file  Social History Narrative   Some caffeine use    Social Drivers of Corporate investment banker Strain: Not on file  Food Insecurity: Not on file  Transportation Needs: Not on file  Physical Activity: Not on file  Stress: Not on file  Social Connections: Not on file   Past Surgical History:   Procedure Laterality Date   HERNIA REPAIR     laparoscopic ventral    SPLENECTOMY, TOTAL     Past Surgical History:  Procedure Laterality Date   HERNIA REPAIR     laparoscopic ventral    SPLENECTOMY, TOTAL     Past Medical History:  Diagnosis Date   Cervical radiculopathy    Cirrhosis (HCC)    GERD (gastroesophageal reflux disease)    Hairy cell leukemia (HCC) 2005   Last chemo 2006   Hepatitis B infection    Incisional hernia    Lumbar radiculopathy    Memory impairment    Myopathy    Vitamin D deficiency    BP (!) 150/91   Pulse 73   Resp 16   Wt 189 lb 9.6 oz (86 kg)   SpO2 98%   BMI 28.00 kg/m   Opioid Risk Score:   Fall Risk Score:  `1  Depression screen [facility] 2/9     09/26/2023   10:17 AM 03/14/2023   10:12 AM 09/07/2022    9:44 AM 03/24/2021   10:17 AM 04/08/2016   10:17 AM 10/06/2015    2:10 PM  Depression screen PHQ 2/9  Decreased Interest 0 0 0 0 3 3  Down,  Depressed, Hopeless 0 0 0 0 0 0  PHQ - 2 Score 0 0 0 0 3 3  Altered sleeping      0  Tired, decreased energy      1  Change in appetite      1  Feeling bad or failure about yourself       1  Trouble concentrating      1  Moving slowly or fidgety/restless      0  Suicidal thoughts      0   PHQ-9 Score      7     Data saved with a previous flowsheet row definition    Review of Systems  Musculoskeletal:  Positive for back pain.       Right side arm and leg        Objective:   Physical Exam Vitals and nursing note reviewed.  Constitutional:      Appearance: Normal appearance.  Cardiovascular:     Rate and Rhythm: Normal rate and regular rhythm.     Pulses: Normal pulses.     Heart sounds: Normal heart sounds.  Pulmonary:     Effort: Pulmonary effort is normal.     Breath sounds: Normal breath sounds.  Musculoskeletal:     Comments: Normal Muscle Bulk and Muscle Testing Reveals:  Upper Extremities: Full ROM and Muscle Strength 5/5  Lumbar Paraspinal Tenderness: L-4-L-5 Mainly Right  Side  Lower Extremities: Full ROM and Muscle Strength 5/5 Arises from Chair with ease Narrow Based  Gait     Skin:    General: Skin is warm and dry.  Neurological:     Mental Status: He is alert and oriented to person, place, and time.  Psychiatric:        Mood and Affect: Mood normal.        Behavior: Behavior normal.          Assessment & Plan:  1. Lumbar Degenerative Disc with Lumbar Radiculitis: Continue HEP as Tolerated. 03/20/2024 Refilled:Tramadol  50 mg take two tablets twice a day as needed for pain #120.  We will continue the opioid monitoring program, this consists of regular clinic visits, examinations, urine drug screen, pill counts as well as use of [place]  Controlled Substance Reporting system. A 12 month History has been reviewed on the [place]  Controlled Substance Reporting System on 03/20/2024. 2. Chronic Right Shoulder Pain: No complaints today. Continue HEP as Tolerated. Continue to Monitor. 03/20/2024   Return in 6 months

## 2024-03-20 ENCOUNTER — Encounter: Attending: Registered Nurse | Admitting: Registered Nurse

## 2024-03-20 ENCOUNTER — Encounter: Payer: Self-pay | Admitting: Registered Nurse

## 2024-03-20 VITALS — BP 129/80 | HR 73 | Resp 16 | Wt 189.6 lb

## 2024-03-20 DIAGNOSIS — Z5181 Encounter for therapeutic drug level monitoring: Secondary | ICD-10-CM | POA: Insufficient documentation

## 2024-03-20 DIAGNOSIS — G894 Chronic pain syndrome: Secondary | ICD-10-CM | POA: Diagnosis not present

## 2024-03-20 DIAGNOSIS — Z79891 Long term (current) use of opiate analgesic: Secondary | ICD-10-CM | POA: Diagnosis not present

## 2024-03-20 DIAGNOSIS — M48061 Spinal stenosis, lumbar region without neurogenic claudication: Secondary | ICD-10-CM | POA: Diagnosis not present

## 2024-03-20 MED ORDER — TRAMADOL HCL 50 MG PO TABS
ORAL_TABLET | ORAL | 5 refills | Status: AC
Start: 2024-03-20 — End: ?

## 2024-05-11 ENCOUNTER — Ambulatory Visit: Admitting: Gastroenterology

## 2024-05-11 ENCOUNTER — Encounter: Payer: Self-pay | Admitting: Gastroenterology

## 2024-05-11 ENCOUNTER — Other Ambulatory Visit

## 2024-05-11 VITALS — BP 122/80 | HR 78 | Ht 69.0 in | Wt 191.0 lb

## 2024-05-11 DIAGNOSIS — B181 Chronic viral hepatitis B without delta-agent: Secondary | ICD-10-CM | POA: Diagnosis not present

## 2024-05-11 DIAGNOSIS — K7460 Unspecified cirrhosis of liver: Secondary | ICD-10-CM | POA: Diagnosis not present

## 2024-05-11 DIAGNOSIS — B1910 Unspecified viral hepatitis B without hepatic coma: Secondary | ICD-10-CM

## 2024-05-11 DIAGNOSIS — K7469 Other cirrhosis of liver: Secondary | ICD-10-CM

## 2024-05-11 DIAGNOSIS — Z800 Family history of malignant neoplasm of digestive organs: Secondary | ICD-10-CM

## 2024-05-11 LAB — CBC WITH DIFFERENTIAL/PLATELET
Basophils Absolute: 0.1 K/uL (ref 0.0–0.1)
Basophils Relative: 1 % (ref 0.0–3.0)
Eosinophils Absolute: 0.2 K/uL (ref 0.0–0.7)
Eosinophils Relative: 2.1 % (ref 0.0–5.0)
HCT: 48.6 % (ref 39.0–52.0)
Hemoglobin: 16.5 g/dL (ref 13.0–17.0)
Lymphocytes Relative: 36.6 % (ref 12.0–46.0)
Lymphs Abs: 3 K/uL (ref 0.7–4.0)
MCHC: 34 g/dL (ref 30.0–36.0)
MCV: 99.6 fl (ref 78.0–100.0)
Monocytes Absolute: 1 K/uL (ref 0.1–1.0)
Monocytes Relative: 11.9 % (ref 3.0–12.0)
Neutro Abs: 3.9 K/uL (ref 1.4–7.7)
Neutrophils Relative %: 48.4 % (ref 43.0–77.0)
Platelets: 315 K/uL (ref 150.0–400.0)
RBC: 4.88 Mil/uL (ref 4.22–5.81)
RDW: 14.1 % (ref 11.5–15.5)
WBC: 8.1 K/uL (ref 4.0–10.5)

## 2024-05-11 LAB — COMPREHENSIVE METABOLIC PANEL WITH GFR
ALT: 17 U/L (ref 0–53)
AST: 24 U/L (ref 0–37)
Albumin: 4.8 g/dL (ref 3.5–5.2)
Alkaline Phosphatase: 99 U/L (ref 39–117)
BUN: 20 mg/dL (ref 6–23)
CO2: 27 meq/L (ref 19–32)
Calcium: 9.8 mg/dL (ref 8.4–10.5)
Chloride: 103 meq/L (ref 96–112)
Creatinine, Ser: 0.81 mg/dL (ref 0.40–1.50)
GFR: 90.18 mL/min (ref >60.00)
Glucose, Bld: 75 mg/dL (ref 70–99)
Potassium: 4.2 meq/L (ref 3.5–5.1)
Sodium: 139 meq/L (ref 135–145)
Total Bilirubin: 0.4 mg/dL (ref 0.2–1.2)
Total Protein: 8.2 g/dL (ref 6.0–8.3)

## 2024-05-11 LAB — PROTIME-INR
INR: 1.1 ratio — ABNORMAL HIGH (ref 0.8–1.0)
Prothrombin Time: 12 s (ref 9.6–13.1)

## 2024-05-11 NOTE — Progress Notes (Signed)
 [facility] Progress Note  Chief Complaint: Chronic hepatitis B with cirrhosis  Subjective  Prior history   Chronic hepatitis B virus infection(eAg neg, eAb pos) and cirrhosis, last seen here October 2020 for.  He had previously declined screening colonoscopy but was agreeable to a Cologuard in December 2023.  He had a positive result but then declined a colonoscopy.  He was given some time to consider it further, contacted again 3 months later and again declined a colonoscopy.  He is also previously declined upper endoscopy for variceal screening.  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 Viread  for his hepatitis B.  Negative viral load May 2023.  No liver masses seen on ultrasound May and June 2023, though somewhat limited acoustic windows.  MRI liver then planned (has IV CT dye allergy), but he ultimately decided not to do it.  Family history of liver cancer [name] agreed to right upper quadrant ultrasound October 2024-report on file, cirrhosis, no liver mass seen.  No liver mass seen on RUQ ultrasound June 2025 done in the [facility] Years of undetected HBV DNA on treatment, July 2022, May 2023, December 2023, October 2024  Discussed the use of AI scribe software for clinical note transcription with the patient, who gave verbal consent to proceed.  History of Present Illness  [name] is glad to report that he currently feels well and has continued good health since I last saw him.  Denies abdominal pain, change in bowel habits, rectal bleeding, nausea vomiting dysphagia or weight loss takes his tenofovir  daily and without difficulty or apparent side effect   ROS: Cardiovascular:  no chest pain Respiratory: no dyspnea  The patient's Past Medical, Family and Social History were reviewed and are on file in the EMR. Past Medical History:  Diagnosis Date   Cervical radiculopathy    Cirrhosis (HCC)    GERD (gastroesophageal reflux disease)    Hairy cell  leukemia (HCC) 2005   Last chemo 2006   Hepatitis B infection    Incisional hernia    Lumbar radiculopathy    Memory impairment    Myopathy    Vitamin D deficiency     Past Surgical History:  Procedure Laterality Date   HERNIA REPAIR     laparoscopic ventral    SPLENECTOMY, TOTAL       Objective:  Med list reviewed  Current Outpatient Medications:    Cyanocobalamin  (VITAMIN B12) 1000 MCG TBCR, 1 tablet Orally Once a day for 30 day(s), Disp: , Rfl:    tenofovir  (VIREAD ) 300 MG tablet, Take 1 tablet (300 mg total) by mouth daily. NEED APPOINTMENT FOR ADDITIONAL REFILLS, Disp: 30 tablet, Rfl: 1   traMADol  (ULTRAM ) 50 MG tablet, TAKE 2 TABLETS(100 MG) BY MOUTH TWICE DAILY, Disp: 120 tablet, Rfl: 5   Vital signs in last 24 hrs: Vitals:   05/11/24 1508  BP: 122/80  Pulse: 78   Wt Readings from Last 3 Encounters:  05/11/24 191 lb (86.6 kg)  03/20/24 189 lb 9.6 oz (86 kg)  10/24/23 187 lb 3.2 oz (84.9 kg)    Physical Exam  Well-appearing HEENT: sclera anicteric, oral mucosa moist without lesions Neck: supple, no thyromegaly, JVD or lymphadenopathy Cardiac: Regular without appreciable murmur,  no peripheral edema Pulm: clear to auscultation bilaterally, normal RR and effort noted Abdomen: soft, no tenderness, with active bowel sounds. No guarding or palpable hepatosplenomegaly. Skin; warm and dry, no jaundice or rash   Labs:    Latest  Ref Rng & Units 10/24/2023   11:43 AM 07/13/2023    1:10 PM 03/30/2023    3:41 PM  CBC  WBC 4.0 - 10.5 K/uL 8.3  12.7  8.6   Hemoglobin 13.0 - 17.0 g/dL 83.8  82.8  83.9   Hematocrit 39.0 - 52.0 % 46.3  50.0  48.4   Platelets 150 - 400 K/uL 328  313  331.0       Latest Ref Rng & Units 10/24/2023   11:43 AM 10/04/2023   12:08 PM 07/13/2023    1:10 PM  CMP  Glucose 70 - 99 mg/dL 99   95   BUN 8 - 23 mg/dL 19   15   Creatinine 9.38 - 1.24 mg/dL 9.12   9.26   Sodium 864 - 145 mmol/L 140   140   Potassium 3.5 - 5.1 mmol/L 3.8   4.0    Chloride 98 - 111 mmol/L 107   108   CO2 22 - 32 mmol/L 29   21   Calcium 8.9 - 10.3 mg/dL 9.5   9.4   Total Protein 6.5 - 8.1 g/dL 8.0  7.9    Total Bilirubin 0.0 - 1.2 mg/dL 0.3  0.5    Alkaline Phos 38 - 126 U/L 122  112    AST 15 - 41 U/L 18  21    ALT 0 - 44 U/L 19  20     INR 1.06 Oct 2023  AFP 2.13 September 2023  ___________________________________________ Radiologic studies:  June 2025 right upper quadrant ultrasound was done through the [facility] with report faxed and scanned to chart. Result note 11/09/2025-small cyst in liver, no reported mass or other finding concerning for liver cancer.  Normal portal blood flow. ____________________________________________ Other:   _____________________________________________   Encounter Diagnoses  Name Primary?   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  He continues to politely declined a colonoscopy for his previously positive Cologuard.  Labs today: CBC, CMP, INR, AFP, hepatitis B virus DNA PCR   [name]

## 2024-05-11 NOTE — Patient Instructions (Signed)
 Your provider has requested that you go to the basement level for lab work before leaving today. Press B on the elevator. The lab is located at the first door on the left as you exit the elevator.   [facility] will contact you to schedule your ultrasound.   Thank you for trusting me with your gastrointestinal care!    Dr. Victory Legrand [facility]

## 2024-05-13 ENCOUNTER — Ambulatory Visit: Payer: Self-pay | Admitting: Gastroenterology

## 2024-05-14 LAB — HEPATITIS B DNA, ULTRAQUANTITATIVE, PCR

## 2024-05-14 LAB — AFP TUMOR MARKER: AFP-Tumor Marker: 2.9 ng/mL (ref <6.1)

## 2024-05-15 ENCOUNTER — Other Ambulatory Visit: Payer: Self-pay

## 2024-05-15 MED ORDER — TENOFOVIR DISOPROXIL FUMARATE 300 MG PO TABS: 300.0000 mg | ORAL_TABLET | Freq: Every day | ORAL | 3 refills | Status: AC

## 2024-05-17 ENCOUNTER — Other Ambulatory Visit

## 2024-05-17 DIAGNOSIS — B181 Chronic viral hepatitis B without delta-agent: Secondary | ICD-10-CM

## 2024-05-19 LAB — HEPATITIS B DNA, ULTRAQUANTITATIVE, PCR
Hepatitis B DNA: NOT DETECTED [IU]/mL
Hepatitis B virus DNA: NOT DETECTED {Log_IU}/mL

## 2024-05-22 ENCOUNTER — Ambulatory Visit: Payer: Self-pay | Admitting: Gastroenterology

## 2024-05-28 ENCOUNTER — Telehealth: Payer: Self-pay | Admitting: Gastroenterology

## 2024-05-28 NOTE — Telephone Encounter (Signed)
(  This patient does not MyChart)  Please call this patient (and leave a voicemail if needed) to let him know that the liver ultrasound from December 19th shows no masses or other abnormalities.  VEAR Brand MD

## 2024-05-29 NOTE — Telephone Encounter (Signed)
 Pt informed of results and states understanding.

## 2024-09-13 ENCOUNTER — Encounter: Admitting: Registered Nurse

## 2024-11-05 ENCOUNTER — Other Ambulatory Visit

## 2024-11-05 ENCOUNTER — Ambulatory Visit: Admitting: Hematology
